# Patient Record
Sex: Female | Born: 1955 | Race: Asian | Hispanic: No | Marital: Married | Smoking: Never smoker
Health system: Southern US, Community
[De-identification: ages and names within clinical notes are randomized; demographics above are authoritative.]

## PROBLEM LIST (undated history)

## (undated) DIAGNOSIS — M1990 Unspecified osteoarthritis, unspecified site: Secondary | ICD-10-CM

## (undated) DIAGNOSIS — H409 Unspecified glaucoma: Secondary | ICD-10-CM

## (undated) DIAGNOSIS — T7840XA Allergy, unspecified, initial encounter: Secondary | ICD-10-CM

## (undated) DIAGNOSIS — K648 Other hemorrhoids: Secondary | ICD-10-CM

## (undated) DIAGNOSIS — E781 Pure hyperglyceridemia: Secondary | ICD-10-CM

## (undated) DIAGNOSIS — F410 Panic disorder [episodic paroxysmal anxiety] without agoraphobia: Secondary | ICD-10-CM

## (undated) DIAGNOSIS — E119 Type 2 diabetes mellitus without complications: Secondary | ICD-10-CM

## (undated) DIAGNOSIS — I10 Essential (primary) hypertension: Secondary | ICD-10-CM

## (undated) DIAGNOSIS — E7800 Pure hypercholesterolemia, unspecified: Secondary | ICD-10-CM

## (undated) DIAGNOSIS — K219 Gastro-esophageal reflux disease without esophagitis: Secondary | ICD-10-CM

## (undated) DIAGNOSIS — H269 Unspecified cataract: Secondary | ICD-10-CM

## (undated) HISTORY — DX: Essential (primary) hypertension: I10

## (undated) HISTORY — DX: Allergy, unspecified, initial encounter: T78.40XA

## (undated) HISTORY — DX: Unspecified cataract: H26.9

## (undated) HISTORY — DX: Panic disorder (episodic paroxysmal anxiety): F41.0

## (undated) HISTORY — DX: Unspecified glaucoma: H40.9

## (undated) HISTORY — PX: EYE SURGERY: SHX253

## (undated) HISTORY — DX: Gastro-esophageal reflux disease without esophagitis: K21.9

## (undated) HISTORY — DX: Unspecified osteoarthritis, unspecified site: M19.90

## (undated) HISTORY — DX: Pure hyperglyceridemia: E78.1

## (undated) HISTORY — DX: Type 2 diabetes mellitus without complications: E11.9

## (undated) HISTORY — DX: Other hemorrhoids: K64.8

## (undated) HISTORY — PX: COLONOSCOPY: SHX174

---

## 1997-01-05 HISTORY — PX: OOPHORECTOMY: SHX86

## 1998-06-17 ENCOUNTER — Encounter: Payer: Self-pay | Admitting: Family Medicine

## 2004-07-02 ENCOUNTER — Other Ambulatory Visit: Admission: RE | Admit: 2004-07-02 | Discharge: 2004-07-02 | Payer: Self-pay | Admitting: Obstetrics and Gynecology

## 2004-08-25 ENCOUNTER — Encounter: Admission: RE | Admit: 2004-08-25 | Discharge: 2004-11-23 | Payer: Self-pay | Admitting: Family Medicine

## 2006-04-02 ENCOUNTER — Encounter: Payer: Self-pay | Admitting: Family Medicine

## 2006-04-09 ENCOUNTER — Ambulatory Visit: Payer: Self-pay | Admitting: Internal Medicine

## 2006-04-15 ENCOUNTER — Encounter: Admission: RE | Admit: 2006-04-15 | Discharge: 2006-07-14 | Payer: Self-pay | Admitting: Family Medicine

## 2006-05-03 ENCOUNTER — Encounter: Payer: Self-pay | Admitting: Family Medicine

## 2006-05-03 ENCOUNTER — Ambulatory Visit: Payer: Self-pay | Admitting: Internal Medicine

## 2006-05-03 HISTORY — PX: COLONOSCOPY: SHX5424

## 2006-05-18 ENCOUNTER — Encounter: Admission: RE | Admit: 2006-05-18 | Discharge: 2006-05-18 | Payer: Self-pay | Admitting: Family Medicine

## 2007-08-08 ENCOUNTER — Encounter: Payer: Self-pay | Admitting: Family Medicine

## 2007-08-08 ENCOUNTER — Encounter: Admission: RE | Admit: 2007-08-08 | Discharge: 2007-08-08 | Payer: Self-pay | Admitting: Family Medicine

## 2008-04-11 ENCOUNTER — Encounter: Payer: Self-pay | Admitting: Family Medicine

## 2008-04-12 ENCOUNTER — Encounter: Payer: Self-pay | Admitting: Family Medicine

## 2008-04-12 ENCOUNTER — Encounter: Admission: RE | Admit: 2008-04-12 | Discharge: 2008-04-12 | Payer: Self-pay | Admitting: Family Medicine

## 2009-09-02 ENCOUNTER — Ambulatory Visit: Payer: Self-pay | Admitting: Family Medicine

## 2009-09-02 ENCOUNTER — Other Ambulatory Visit: Admission: RE | Admit: 2009-09-02 | Discharge: 2009-09-02 | Payer: Self-pay | Admitting: Family Medicine

## 2009-09-02 DIAGNOSIS — E785 Hyperlipidemia, unspecified: Secondary | ICD-10-CM | POA: Insufficient documentation

## 2009-09-04 LAB — CONVERTED CEMR LAB
BUN: 16 mg/dL (ref 6–23)
CO2: 30 meq/L (ref 19–32)
Calcium: 9.4 mg/dL (ref 8.4–10.5)
Cholesterol: 235 mg/dL — ABNORMAL HIGH (ref 0–200)
Creatinine, Ser: 0.6 mg/dL (ref 0.4–1.2)
Direct LDL: 146.3 mg/dL
Glucose, Bld: 105 mg/dL — ABNORMAL HIGH (ref 70–99)
Total CHOL/HDL Ratio: 4
Triglycerides: 165 mg/dL — ABNORMAL HIGH (ref 0.0–149.0)

## 2009-09-10 ENCOUNTER — Encounter: Payer: Self-pay | Admitting: Family Medicine

## 2009-10-01 ENCOUNTER — Encounter: Admission: RE | Admit: 2009-10-01 | Discharge: 2009-10-01 | Payer: Self-pay | Admitting: Family Medicine

## 2010-01-01 ENCOUNTER — Ambulatory Visit: Payer: Self-pay | Admitting: Family Medicine

## 2010-01-26 ENCOUNTER — Encounter: Payer: Self-pay | Admitting: Family Medicine

## 2010-01-30 ENCOUNTER — Ambulatory Visit
Admission: RE | Admit: 2010-01-30 | Discharge: 2010-01-30 | Payer: Self-pay | Source: Home / Self Care | Attending: Family Medicine | Admitting: Family Medicine

## 2010-01-30 ENCOUNTER — Encounter: Payer: Self-pay | Admitting: Family Medicine

## 2010-02-04 NOTE — Letter (Signed)
Summary: Results Follow up Letter  [facility]  [address] [place], [place] [address]   Phone: [phone]  Fax: [phone]    09/10/2009 MRN: [id number]  Victoria Neal [address] [place], [place]  [address]  Dear Ms. Bess Kinds,  The following are the results of your recent test(s):  Test         Result    Pap Smear:        Normal __X___  Not Normal _____ Comments: ______________________________________________________ Cholesterol: LDL(Bad cholesterol):         Your goal is less than:         HDL (Good cholesterol):       Your goal is more than: Comments:  ______________________________________________________ Mammogram:        Normal _____  Not Normal _____ Comments:  ___________________________________________________________________ Hemoccult:        Normal _____  Not normal _______ Comments:    _____________________________________________________________________ Other Tests:    We routinely do not discuss normal results over the telephone.  If you desire a copy of the results, or you have any questions about this information we can discuss them at your next office visit.   Sincerely,      Ruthe Mannan, MD

## 2010-02-04 NOTE — Assessment & Plan Note (Signed)
Summary: NEW PT TO EST/CPX/CLE   Vital Signs:  Patient profile:   55 year old female Height:      63.5 inches Weight:      128 pounds BMI:     22.40 Temp:     98.6 degrees F oral Pulse rate:   60 / minute Pulse rhythm:   regular BP sitting:   128 / 88  (left arm) Cuff size:   regular  Vitals Entered By: Sydell Axon LPN (11-Sep-2009 9:57 AM) CC: New patient to get established   History of Present Illness: 55 yo here to establish care and for CPX.  Borderline DM- was told years ago that she has borderline DM.  Exercises and tries to eat right.  Never been on medication for diabetes.  Denies frequent thirst or urination.  No frequent yeast infections.    Fatigue- does often feel fatigues although she thinks it is due to her busy lifestyle.  Hair has been a little dry lately.  Son has h/o hypothyroidism.  Well woman- no h/o abnormal pap smears.  Preventive Screening-Counseling & Management  Alcohol-Tobacco     Smoking Status: never  Caffeine-Diet-Exercise     Does Patient Exercise: yes      Drug Use:  no.    Current Medications (verified): 1)  Multivitamins  Tabs (Multiple Vitamin) .... Take One By Mouth Daily 2)  Calcium 500 Mg Tabs (Calcium) .... Take One By Mouth Two Times A Day  Allergies (verified): 1)  ! Aspirin (Aspirin)  Past History:  Family History: Last updated: 2009/09/11 Mom died of colon CA at 104 yo Sister died of lung CA at 3. 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 Eyes:  Denies blurring. ENT:  Denies difficulty swallowing. CV:  Denies chest pain or discomfort. Resp:  Denies shortness of breath. GI:  Denies abdominal pain, bloody stools, and change in bowel habits. GU:  Denies abnormal vaginal bleeding, decreased libido, discharge, and dysuria. MS:  Denies joint pain, joint redness, and joint swelling. Derm:  Denies rash. Neuro:  Denies visual disturbances and weakness. Psych:  Denies anxiety and depression. Endo:  Complains of cold intolerance; denies excessive thirst and excessive urination. Heme:  Denies abnormal bruising and bleeding.  Physical Exam  General:  Well-developed,well-nourished,in no acute distress; alert,appropriate and cooperative throughout examination Head:  normocephalic, atraumatic, and no abnormalities observed.   Eyes:  vision grossly intact, pupils equal, pupils round, and pupils reactive to light.   Ears:  R ear normal and L ear normal.   Nose:  no external deformity.   Mouth:  good dentition.   Breasts:  No mass, nodules, thickening, tenderness, bulging, retraction, inflamation, nipple discharge or skin changes noted.   Lungs:  Normal respiratory effort, chest expands symmetrically. Lungs are clear to auscultation, no crackles or wheezes. Heart:  Normal rate and regular rhythm. S1 and S2 normal  without gallop, murmur, click, rub or other extra sounds. Abdomen:  Bowel sounds positive,abdomen soft and non-tender without masses, organomegaly or hernias noted. Rectal:  no external abnormalities and no hemorrhoids.   Genitalia:  Pelvic Exam:        External: normal female genitalia without lesions or masses        Vagina: normal without lesions or masses        Cervix: normal without lesions or masses        Adnexa: normal  bimanual exam without masses or fullness        Uterus: normal by palpation        Pap smear: performed Msk:  No deformity or scoliosis noted of thoracic or lumbar spine.   Extremities:  No clubbing, cyanosis, edema, or deformity noted with normal full range of motion of all joints.   Neurologic:  alert & oriented X3 and gait normal.   Skin:  Intact without suspicious lesions or rashes Psych:  Cognition and judgment appear intact. Alert and cooperative with normal attention span and concentration. No apparent delusions, illusions, hallucinations   Impression & Recommendations:  Problem # 1:  Preventive Health Care (ICD-V70.0) Reviewed preventive care protocols, scheduled due services, and updated immunizations Discussed nutrition, exercise, diet, and healthy lifestyle.  Tdap, pap smear today. Set up mammogram today.  Problem # 2:  ? of UNSPECIFIED HYPOTHYROIDISM (ICD-244.9) Assessment: New check TSH today. Orders: TLB-TSH (Thyroid Stimulating Hormone) (84443-TSH)  Problem # 3:  DIABETES MELLITUS, TYPE II (ICD-250.00) a1c today. Orders: TLB-A1C / Hgb A1C (Glycohemoglobin) (83036-A1C) TLB-Lipid Panel (80061-LIPID)  Complete Medication List: 1)  Multivitamins Tabs (Multiple vitamin) .... Take one by mouth daily 2)  Calcium 500 Mg Tabs (Calcium) .... Take one by mouth two times a day  Other Orders: Radiology Referral (Radiology) Venipuncture [phone]) TLB-BMP (Basic Metabolic Panel-BMET) (80048-METABOL) Tdap => 45yrs IM (60454) Admin 1st Vaccine ([address]) Admin 1st Vaccine [facility]) [phone])  Patient Instructions: 1)  Please stop by to see Shirlee Limerick on your way out.  Current Allergies (reviewed today): ! ASPIRIN (ASPIRIN)   Prevention & Chronic Care Immunizations   Influenza vaccine: Not documented    Tetanus booster: 09/02/2009: Tdap    Pneumococcal vaccine: Not documented  Colorectal Screening   Hemoccult: Not documented   Hemoccult due: Not Indicated    Colonoscopy:  historical  (08/17/2007)   Colonoscopy due: 08/16/2017  Other Screening   Pap smear: Not documented   Pap smear action/deferral: Ordered  (09/02/2009)    Mammogram: Not documented   Mammogram action/deferral: Ordered  (09/02/2009)   Smoking status: never  (09/02/2009)  Diabetes Mellitus   HgbA1C: Not documented   HgbA1C action/deferral: Ordered  (09/02/2009)    Eye exam: Not documented    Foot exam: Not documented   High risk foot: Not documented   Foot care education: Not documented    Urine microalbumin/creatinine ratio: Not documented  Lipids   Total Cholesterol: Not documented   Lipid panel action/deferral: Lipid Panel ordered   LDL: Not documented   LDL Direct: Not documented   HDL: Not documented   Triglycerides: Not documented  Self-Management Support :    Diabetes self-management support: Not documented   Nursing Instructions: Give tetanus booster today Pap smear today Schedule screening mammogram (see order) HgbA1C today (see order)    Tetanus/Td Vaccine    Vaccine Type: Tdap    Site: left deltoid    Mfr: GlaxoSmithKline    Dose: 0.5 ml    Given by: Sydell Axon  LPN    Exp. Date: 11/02/2011    Lot #: [id number]    VIS given: 11/23/06 version given September 02, 2009.   Past Medical History:    Diabetes mellitus, type II borderline  Past Surgical History:    Oophorectomy   Flex Sig Next Due:  Not Indicated Colonoscopy Result Date:  08/17/2007 Colonoscopy Result:  historical Colonoscopy Next Due:  10 yr Hemoccult Next Due:  Not Indicated

## 2010-02-04 NOTE — Procedures (Signed)
Summary: Colonoscopy/[facility]  Colonoscopy/[facility]   Imported By: Sherian Rein 09/23/2009 14:46:30  _____________________________________________________________________  External Attachment:    Type:   Image     Comment:   External Document

## 2010-02-06 NOTE — Assessment & Plan Note (Signed)
Summary: coughing/alc   Vital Signs:  Patient profile:   55 year old female Height:      63.5 inches Weight:      130.25 pounds BMI:     22.79 Temp:     98.9 degrees F oral Pulse rate:   73 / minute Pulse rhythm:   regular BP sitting:   130 / 90  (left arm) Cuff size:   regular  Vitals Entered By: Linde Gillis CMA Duncan Dull) (January 30, 2010 3:25 PM) CC: cough   History of Present Illness: 55 yo with 3 days of runny nose, dry cough, laryngitis. No fevers or chills. Taking OTC decongestant and Nyquil both of which are helping.  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  No CP or SOB.  Current Medications (verified): 1)  Multivitamins  Tabs (Multiple Vitamin) .... Take One By Mouth Daily 2)  Calcium 500 Mg Tabs (Calcium) .... Take One By Mouth Two Times A Day 3)  Metformin Hcl 500 Mg Tabs (Metformin Hcl) .... Take One Tablet By Mouth Daily  Allergies: 1)  ! Aspirin (Aspirin)  Past History:  Past Medical History: Last updated: 01-Oct-2009 Diabetes mellitus, type II borderline  Past Surgical History: Last updated: Oct 01, 2009 Oophorectomy  Family History: Last updated: 2009-10-01 Mom died of colon CA at 40 yo Sister died of lung CA at 39. 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 ENT:  Complains of hoarseness, nasal congestion, sinus pressure, and sore throat. Resp:  Complains of cough; denies shortness of breath, sputum productive, and wheezing.  Physical Exam  General:  Well-developed,well-nourished,in no acute distress; alert,appropriate and cooperative throughout examination, sounds hoarse Ears:  R ear normal and L ear normal.   Nose:  mild eythema, +PND Mouth:   pharyngeal erythema.   Lungs:  Normal respiratory effort, chest expands symmetrically. Lungs are clear to auscultation, no crackles or wheezes. Heart:  Normal rate and regular rhythm. S1 and S2 normal without gallop, murmur, click, rub or other extra sounds. Extremities:  No clubbing, cyanosis, edema,   Psych:  Cognition and judgment appear intact. Alert and cooperative with normal attention span and concentration. No apparent delusions, illusions, hallucinations   Impression & Recommendations:  Problem # 1:  URI (ICD-465.9) Assessment New Likely viral. Advised continued supportive care. See pt instructions for details.  Complete Medication List: 1)  Multivitamins Tabs (Multiple vitamin) .... Take one by mouth daily 2)  Calcium 500 Mg Tabs (Calcium) .... Take one by mouth two times a day 3)  Metformin Hcl 500 Mg Tabs (Metformin hcl) .... Take one tablet by mouth daily  Patient Instructions: 1)  Get plenty of rest, drink lots of clear liquids, and use Tylenol or Ibuprofen for fever and comfort. Return in 7-10 days if you're not better: sooner if you'er feeling worse.    Orders Added: 1)  Est. Patient Level III [08657]    Current Allergies (reviewed today): ! ASPIRIN (ASPIRIN)

## 2010-02-06 NOTE — Assessment & Plan Note (Signed)
Summary: problems with fingers/rbh   Vital Signs:  Patient profile:   55 year old female Height:      63.5 inches Weight:      129.75 pounds BMI:     22.71 Temp:     98.7 degrees F oral Pulse rate:   68 / minute Pulse rhythm:   regular BP sitting:   120 / 80  (left arm) Cuff size:   regular  Vitals Entered By: Linde Gillis CMA Duncan Dull) (January 01, 2010 10:14 AM) CC: fingers cramping   History of Present Illness: 55 yo here with finger cramping.  H/o DM on Moetromin since August.  No known injury but has noticed in past couple of months, inner palm of her left hand feels tight and she cannot close her middle finger all the way.  Afraid she will drop something if she grips something valuable in that hand.  No radiculopathy.  Never had anything like this before.  In August 2011, a1c was 6.4. CBGs have been stable.  Current Medications (verified): 1)  Multivitamins  Tabs (Multiple Vitamin) .... Take One By Mouth Daily 2)  Calcium 500 Mg Tabs (Calcium) .... Take One By Mouth Two Times A Day 3)  Metformin Hcl 500 Mg Tabs (Metformin Hcl) .... Take One Tablet By Mouth Daily  Allergies: 1)  ! Aspirin (Aspirin)  Past History:  Past Medical History: Last updated: 09-18-2009 Diabetes mellitus, type II borderline  Past Surgical History: Last updated: 18-Sep-2009 Oophorectomy  Family History: Last updated: Sep 18, 2009 Mom died of colon CA at 21 yo Sister died of lung CA at 81. 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 MS:  Complains of loss of strength and muscle weakness.  Physical Exam  General:  Well-developed,well-nourished,in no acute distress; alert,appropriate and cooperative throughout examination Msk:  left  palm- palpable cords in palm, difficult flexing middle finger.   Strenght and sensation otherwise in tact. Pulses:  R and L carotid,radial,femoral,dorsalis pedis and posterior tibial pulses are full and equal bilaterally Extremities:  No clubbing, cyanosis, edema,   Psych:  Cognition and judgment appear intact. Alert and cooperative with normal attention span and concentration. No apparent delusions, illusions, hallucinations   Impression & Recommendations:  Problem # 1:  ? 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  Given handout for conservative stretches.  Pt to follow up in 3 months, sooner if symptoms progress.  Complete Medication List: 1)  Multivitamins Tabs (Multiple vitamin) .... Take one by mouth daily 2)  Calcium 500 Mg Tabs (Calcium) .... Take one by mouth two times a day 3)  Metformin Hcl 500 Mg Tabs (Metformin hcl) .... Take one tablet by mouth daily   Orders Added: 1)  Est. Patient Level IV [[address]]    Current Allergies (reviewed today): ! ASPIRIN (ASPIRIN)

## 2010-02-06 NOTE — Letter (Signed)
Summary: Out of Work  [facility]  [address] [place], [place] [address]   Phone: [phone]  Fax: [phone]    January 30, 2010   Employee:  Flossie Gose    To Whom It May Concern:   For Medical reasons, please excuse the above named employee from work for the following dates:  Start:   01/29/2010  End:   01/31/2010  If you need additional information, please feel free to contact our office.         Sincerely,    Ruthe Mannan MD

## 2010-11-03 ENCOUNTER — Other Ambulatory Visit: Payer: Self-pay | Admitting: Family Medicine

## 2010-11-03 DIAGNOSIS — Z1231 Encounter for screening mammogram for malignant neoplasm of breast: Secondary | ICD-10-CM

## 2010-11-20 ENCOUNTER — Ambulatory Visit
Admission: RE | Admit: 2010-11-20 | Discharge: 2010-11-20 | Disposition: A | Discharge status: Home / Self Care | Payer: BC Managed Care – PPO | Source: Ambulatory Visit | Attending: Family Medicine | Admitting: Family Medicine

## 2010-11-20 DIAGNOSIS — Z1231 Encounter for screening mammogram for malignant neoplasm of breast: Secondary | ICD-10-CM

## 2010-12-10 ENCOUNTER — Ambulatory Visit: Payer: Self-pay | Admitting: Internal Medicine

## 2010-12-26 ENCOUNTER — Other Ambulatory Visit: Payer: Self-pay | Admitting: Family Medicine

## 2011-01-07 ENCOUNTER — Other Ambulatory Visit: Payer: BC Managed Care – PPO

## 2011-01-23 LAB — HM MAMMOGRAPHY: HM Mammogram: NORMAL

## 2011-03-18 ENCOUNTER — Encounter: Payer: Self-pay | Admitting: Internal Medicine

## 2011-04-02 ENCOUNTER — Ambulatory Visit
Admission: RE | Admit: 2011-04-02 | Discharge: 2011-04-02 | Disposition: A | Discharge status: Home / Self Care | Payer: BC Managed Care – PPO | Source: Ambulatory Visit | Attending: Family Medicine | Admitting: Family Medicine

## 2011-09-06 LAB — HM DIABETES EYE EXAM

## 2011-11-16 ENCOUNTER — Other Ambulatory Visit: Payer: Self-pay | Admitting: Family Medicine

## 2011-11-16 DIAGNOSIS — Z1231 Encounter for screening mammogram for malignant neoplasm of breast: Secondary | ICD-10-CM

## 2011-12-02 LAB — HM PAP SMEAR: HM Pap smear: NORMAL

## 2011-12-21 ENCOUNTER — Ambulatory Visit: Payer: BC Managed Care – PPO

## 2011-12-23 ENCOUNTER — Ambulatory Visit
Admission: RE | Admit: 2011-12-23 | Discharge: 2011-12-23 | Disposition: A | Discharge status: Home / Self Care | Payer: BC Managed Care – PPO | Source: Ambulatory Visit | Attending: Family Medicine | Admitting: Family Medicine

## 2011-12-23 DIAGNOSIS — Z1231 Encounter for screening mammogram for malignant neoplasm of breast: Secondary | ICD-10-CM

## 2012-01-11 ENCOUNTER — Other Ambulatory Visit: Payer: Self-pay | Admitting: Ophthalmology

## 2012-01-11 DIAGNOSIS — H0589 Other disorders of orbit: Secondary | ICD-10-CM

## 2012-01-19 ENCOUNTER — Other Ambulatory Visit: Payer: BC Managed Care – PPO

## 2012-01-21 ENCOUNTER — Ambulatory Visit
Admission: RE | Admit: 2012-01-21 | Discharge: 2012-01-21 | Disposition: A | Discharge status: Home / Self Care | Payer: BC Managed Care – PPO | Source: Ambulatory Visit | Attending: Ophthalmology | Admitting: Ophthalmology

## 2012-01-21 DIAGNOSIS — H0589 Other disorders of orbit: Secondary | ICD-10-CM

## 2012-01-21 MED ORDER — IOHEXOL 300 MG/ML  SOLN
75.0000 mL | Freq: Once | INTRAMUSCULAR | Status: AC | PRN
  Administered 2012-01-21: 75 mL via INTRAVENOUS

## 2012-06-09 ENCOUNTER — Emergency Department (HOSPITAL_COMMUNITY): Payer: BC Managed Care – PPO

## 2012-06-09 ENCOUNTER — Encounter (HOSPITAL_COMMUNITY): Payer: Self-pay | Admitting: Emergency Medicine

## 2012-06-09 ENCOUNTER — Emergency Department (HOSPITAL_COMMUNITY)
Admission: EM | Admit: 2012-06-09 | Discharge: 2012-06-10 | Disposition: A | Discharge status: Home / Self Care | Payer: BC Managed Care – PPO | Attending: Emergency Medicine | Admitting: Emergency Medicine

## 2012-06-09 DIAGNOSIS — R079 Chest pain, unspecified: Secondary | ICD-10-CM

## 2012-06-09 DIAGNOSIS — Z87898 Personal history of other specified conditions: Secondary | ICD-10-CM | POA: Insufficient documentation

## 2012-06-09 DIAGNOSIS — Z8719 Personal history of other diseases of the digestive system: Secondary | ICD-10-CM | POA: Insufficient documentation

## 2012-06-09 DIAGNOSIS — E119 Type 2 diabetes mellitus without complications: Secondary | ICD-10-CM | POA: Insufficient documentation

## 2012-06-09 DIAGNOSIS — E785 Hyperlipidemia, unspecified: Secondary | ICD-10-CM | POA: Insufficient documentation

## 2012-06-09 DIAGNOSIS — R51 Headache: Secondary | ICD-10-CM | POA: Insufficient documentation

## 2012-06-09 DIAGNOSIS — R0602 Shortness of breath: Secondary | ICD-10-CM | POA: Insufficient documentation

## 2012-06-09 DIAGNOSIS — I10 Essential (primary) hypertension: Secondary | ICD-10-CM | POA: Insufficient documentation

## 2012-06-09 DIAGNOSIS — R0789 Other chest pain: Secondary | ICD-10-CM | POA: Insufficient documentation

## 2012-06-09 DIAGNOSIS — R109 Unspecified abdominal pain: Secondary | ICD-10-CM | POA: Insufficient documentation

## 2012-06-09 DIAGNOSIS — Z79899 Other long term (current) drug therapy: Secondary | ICD-10-CM | POA: Insufficient documentation

## 2012-06-09 DIAGNOSIS — M810 Age-related osteoporosis without current pathological fracture: Secondary | ICD-10-CM | POA: Insufficient documentation

## 2012-06-09 LAB — CBC
HCT: 43.3 % (ref 36.0–46.0)
Hemoglobin: 14.9 g/dL (ref 12.0–15.0)
MCH: 29.6 pg (ref 26.0–34.0)
MCHC: 34.4 g/dL (ref 30.0–36.0)
MCV: 86.1 fL (ref 78.0–100.0)

## 2012-06-09 LAB — TROPONIN I: Troponin I: <0.3 ng/mL (ref <0.30)

## 2012-06-09 LAB — BASIC METABOLIC PANEL
BUN: 15 mg/dL (ref 6–23)
Calcium: 10.3 mg/dL (ref 8.4–10.5)
Creatinine, Ser: 0.64 mg/dL (ref 0.50–1.10)
GFR calc non Af Amer: >90 mL/min (ref >90)
Glucose, Bld: 148 mg/dL — ABNORMAL HIGH (ref 70–99)

## 2012-06-09 MED ORDER — ALUM & MAG HYDROXIDE-SIMETH 200-200-20 MG/5ML PO SUSP
30.0000 mL | Freq: Once | ORAL | Status: AC
  Administered 2012-06-09: 30 mL via ORAL
  Filled 2012-06-09: qty 30

## 2012-06-09 NOTE — ED Notes (Signed)
Pt c/o mid sternal CP today with abd pain and HA

## 2012-06-09 NOTE — ED Notes (Signed)
Wait time advised 

## 2012-06-10 LAB — HM DIABETES EYE EXAM

## 2012-06-10 NOTE — ED Provider Notes (Signed)
History     CSN: [id number]  Arrival date & time 06/09/12  1755   First MD Initiated Contact with Patient 06/09/12 2118      Chief Complaint  Patient presents with  . Chest Pain  . Headache  . Abdominal Pain    (Consider location/radiation/quality/duration/timing/severity/associated sxs/prior treatment) Patient is a 57 y.o. female presenting with chest pain, headaches, and abdominal pain.  Chest Pain Pain location:  Substernal area Pain quality: aching   Pain radiates to:  Does not radiate Pain radiates to the back: no   Pain severity:  Mild Onset quality:  Sudden Duration:  1 day Timing:  Intermittent Progression:  Unchanged Chronicity:  New Context: eating (occurred after eating)   Relieved by:  Nothing Worsened by:  Nothing tried Ineffective treatments:  None tried Associated symptoms: abdominal pain, headache and shortness of breath   Associated symptoms: no cough, no dizziness, no fever, no nausea, no numbness, no palpitations, not vomiting and no weakness   Headache Associated symptoms: abdominal pain   Associated symptoms: no congestion, no cough, no diarrhea, no dizziness, no fever, no nausea, no numbness, no sore throat and no vomiting   Abdominal Pain Associated symptoms include abdominal pain, chest pain and headaches. Pertinent negatives include no chills, congestion, coughing, fever, nausea, numbness, sore throat, vomiting or weakness.    Past Medical History  Diagnosis Date  . DM (diabetes mellitus)   . Pure hyperglyceridemia   . Glaucoma   . HTN (hypertension)   . Allergic rhinitis   . Osteoporosis   . Internal hemorrhoids     Past Surgical History  Procedure Laterality Date  . Colonoscopy  05/03/2006    internal hemorrhoids    Family History  Problem Relation Age of Onset  . Coronary artery disease Maternal Uncle   . Diabetes      paternal family  . Hypertension      maternal family  . Lung cancer Sister   . Breast cancer Maternal Aunt    . Colon cancer Mother     deceased age 47    History  Substance Use Topics  . Smoking status: Not on file  . Smokeless tobacco: Not on file  . Alcohol Use: Not on file    OB History   Grav Para Term Preterm Abortions TAB SAB Ect Mult Living                  Review of Systems  Constitutional: Negative for fever and chills.  HENT: Negative for congestion, sore throat and rhinorrhea.   Respiratory: Positive for shortness of breath. Negative for cough and chest tightness.   Cardiovascular: Positive for chest pain. Negative for palpitations.  Gastrointestinal: Positive for abdominal pain. Negative for nausea, vomiting, diarrhea, constipation and rectal pain.  Genitourinary: Negative for dysuria.  Neurological: Positive for headaches. Negative for dizziness, weakness and numbness.  All other systems reviewed and are negative.    Allergies  Aspirin; Lisinopril; and Statins  Home Medications   Current Outpatient Rx  Name  Route  Sig  Dispense  Refill  . amLODipine (NORVASC) 5 MG tablet   Oral   Take 10 mg by mouth daily.          . metFORMIN (GLUCOPHAGE-XR) 500 MG 24 hr tablet   Oral   Take 500 mg by mouth daily with breakfast.          . pravastatin (PRAVACHOL) 40 MG tablet   Oral   Take 40 mg by mouth  daily.           BP 159/78  Pulse 65  Temp(Src) 98 F (36.7 C) (Oral)  Resp 18  SpO2 99%  Physical Exam  Nursing note and vitals reviewed. Constitutional: She is oriented to person, place, and time. She appears well-developed and well-nourished. No distress.  HENT:  Head: Normocephalic and atraumatic.  Mouth/Throat: Oropharynx is clear and moist.  Eyes: EOM are normal. Pupils are equal, round, and reactive to light.  Neck: Normal range of motion. Neck supple.  Cardiovascular: Normal rate, regular rhythm and normal heart sounds.  Exam reveals no friction rub.   No murmur heard. Pulmonary/Chest: Effort normal and breath sounds normal. No respiratory  distress. She has no wheezes. She has no rales.  Abdominal: Soft. There is no tenderness. There is no rebound and no guarding.  Musculoskeletal: Normal range of motion. She exhibits no edema and no tenderness.  Lymphadenopathy:    She has no cervical adenopathy.  Neurological: She is alert and oriented to person, place, and time.  Skin: Skin is warm and dry. No rash noted.  Psychiatric: She has a normal mood and affect. Her behavior is normal.    ED Course  Procedures (including critical care time)  Labs Reviewed  BASIC METABOLIC PANEL - Abnormal; Notable for the following:    Glucose, Bld 148 (*)    All other components within normal limits  CBC  TROPONIN I  POCT I-STAT TROPONIN I   Dg Chest 2 View  06/09/2012   *RADIOLOGY REPORT*  Clinical Data: Chest pain and heaviness; shortness of breath, headache and abdominal pain.  CHEST - 2 VIEW  Comparison: None.  Findings: The lungs are well-aerated.  Mild left basilar opacity likely reflects atelectasis.  There is no evidence of pleural effusion or pneumothorax.  The heart is normal in size; the mediastinal contour is within normal limits.  No acute osseous abnormalities are seen.  IMPRESSION: Mild left basilar airspace opacity likely reflects atelectasis; lungs otherwise clear.   Original Report Authenticated By: Tonia Ghent, M.D.    Date: 06/10/2012 6:03PM  Rate: 70  Rhythm: normal sinus rhythm  QRS Axis: normal  Intervals: normal  ST/T Wave abnormalities: normal  Conduction Disutrbances:left anterior fascicular block  Narrative Interpretation: incomplete RBBB, LVH  Old EKG Reviewed: unchanged   Date: 06/10/2012 12:03AM  Rate: 61  Rhythm: normal sinus rhythm  QRS Axis: normal  Intervals: normal  ST/T Wave abnormalities: normal  Conduction Disutrbances:left posterior fascicular block  Narrative Interpretation: incomplete RBBB  Old EKG Reviewed: unchanged    1. Chest pain       MDM  12:07 AM 56yo female with h/o DM,  HTN, HLD here with chest pain that started today after eating. Reports substernal with aching sensation and SOB. No n/v/d, cough or fever. Reports h/o similar but not as bad. Episodes are intermittent lasting about one minute then resolving. Overall low suspicion for ACS, doubt PE. Initial EKG unremarkable. Delta tn neg. Repeat EKG with delta tn unchanged. Discussed with pt. Will call to f/u with cardiology. Pt given return precautions and dc'd home in stable condition.         Caren Hazy, MD 06/10/12 [phone]

## 2012-06-11 NOTE — ED Provider Notes (Signed)
I have personally seen and examined the patient.  I have discussed the plan of care with the resident.  I have reviewed the documentation on PMH/FH/Soc. History.  I have reviewed the documentation of the resident and agree.  I have reviewed and agree with the ECG interpretation(s) documented by the resident.  Pt with some atypical features of CP on my eval.  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  She denied exertional CP on my evaluation  I Feel she is safe for d/c home  Joya Gaskins, MD 06/11/12 [phone]

## 2012-09-20 ENCOUNTER — Telehealth: Payer: Self-pay | Admitting: Family Medicine

## 2012-09-20 NOTE — Telephone Encounter (Signed)
Ok with me. Needs new patient visit. Let her know will be leaving on maternity in next 2 weeks. Should return in mid November.

## 2012-09-20 NOTE — Telephone Encounter (Signed)
Patient was patient of Dr. Elmer Sow - has not been seen in over 2 years. She now lives on this side of [place] and would like to transfer to [employer] (female provider preference) d/t geographical location. Is patient ok to transfer from Dr. Dayton Martes to Dr. Selena Batten?

## 2012-09-20 NOTE — Telephone Encounter (Signed)
Called pt and LMTCB.

## 2012-09-20 NOTE — Telephone Encounter (Signed)
Pt sched for 09/27/12. New pt paperwork mailed.

## 2012-09-20 NOTE — Telephone Encounter (Signed)
Ok with me if ok with Dr. Selena Batten. She is a nice lady.

## 2012-09-27 ENCOUNTER — Encounter: Payer: Self-pay | Admitting: Family Medicine

## 2012-09-27 ENCOUNTER — Ambulatory Visit (INDEPENDENT_AMBULATORY_CARE_PROVIDER_SITE_OTHER): Payer: BC Managed Care – PPO | Admitting: Family Medicine

## 2012-09-27 VITALS — BP 120/80 | Temp 98.1°F | Ht 63.25 in | Wt 135.0 lb

## 2012-09-27 DIAGNOSIS — Z23 Encounter for immunization: Secondary | ICD-10-CM

## 2012-09-27 DIAGNOSIS — E119 Type 2 diabetes mellitus without complications: Secondary | ICD-10-CM

## 2012-09-27 DIAGNOSIS — I10 Essential (primary) hypertension: Secondary | ICD-10-CM

## 2012-09-27 DIAGNOSIS — M810 Age-related osteoporosis without current pathological fracture: Secondary | ICD-10-CM

## 2012-09-27 DIAGNOSIS — E785 Hyperlipidemia, unspecified: Secondary | ICD-10-CM

## 2012-09-27 LAB — BASIC METABOLIC PANEL
CO2: 30 mEq/L (ref 19–32)
Chloride: 105 mEq/L (ref 96–112)
Creatinine, Ser: 0.7 mg/dL (ref 0.4–1.2)
Glucose, Bld: 83 mg/dL (ref 70–99)

## 2012-09-27 LAB — LIPID PANEL
Cholesterol: 252 mg/dL — ABNORMAL HIGH (ref 0–200)
Triglycerides: 300 mg/dL — ABNORMAL HIGH (ref 0.0–149.0)

## 2012-09-27 LAB — MICROALBUMIN / CREATININE URINE RATIO: Creatinine,U: 70.9 mg/dL

## 2012-09-27 LAB — HEMOGLOBIN A1C: Hgb A1c MFr Bld: 7 % — ABNORMAL HIGH (ref 4.6–6.5)

## 2012-09-27 MED ORDER — PRAVASTATIN SODIUM 20 MG PO TABS: 20.0000 mg | ORAL_TABLET | Freq: Every day | ORAL | Status: DC

## 2012-09-27 NOTE — Progress Notes (Signed)
Chief Complaint  Patient presents with  . Establish Care    HPI:  Victoria Neal is here to establish care.  More convenient location. Last PCP and physical: last December 2013 had pap, mammo all normal, taking vit d and calcium; last labs 3 months ago  Has the following chronic problems and concerns today:  Patient Active Problem List   Diagnosis Date Noted  . Hyperlipidemia LDL goal < 100 09/27/2012  . Essential hypertension, benign 09/27/2012  . Osteoporosis, unspecified - refuses medications for this, doing vit D, calcium and exercise 09/27/2012  . DIABETES MELLITUS, TYPE II 09/02/2009   Health Maintenance: -reports UTD on pap, mammo, colonoscopy -get flu vaccine at school  ROS: See pertinent positives and negatives per HPI.  Past Medical History  Diagnosis Date  . DM (diabetes mellitus)   . Pure hyperglyceridemia   . HTN (hypertension)   . Allergic rhinitis   . Internal hemorrhoids   . Glaucoma     ? of, but seeing optho and told recently not glaucoma  . Osteoporosis     in 2010, has refused bisphosphnate treatment or treament other then vit d and calcium    Family History  Problem Relation Age of Onset  . Coronary artery disease Maternal Uncle   . Lung cancer Sister   . Cancer Sister     lung   . Breast cancer Maternal Aunt   . Colon cancer Mother     deceased age 36  . Cancer Mother 26  . Hypertension Mother   . Diabetes Father   . Hypertension Father   . Diabetes Paternal Aunt     History   Social History  . Marital Status: Married    Spouse Name: N/A    Number of Children: N/A  . Years of Education: N/A   Social History Main Topics  . Smoking status: Never Smoker   . Smokeless tobacco: None  . Alcohol Use: Yes     Comment: occ - very rare  . Drug Use: No  . Sexual Activity: None   Other Topics Concern  . 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 [employer] Situation: lives with husband and son  - has 4 children, son in [employer] school, son animation, daughter is Radio producer      Spiritual Beliefs: Baha'i faith - all religions are one, prayer based, no health care restrictions      Lifestyle: no CV exercise; diet is good             Current outpatient prescriptions:amLODipine (NORVASC) 5 MG tablet, Take 5 mg by mouth daily. , Disp: , Rfl: ;  BLACK COHOSH EXTRACT PO, Take by mouth daily., Disp: , Rfl: ;  CINNAMON PO, Take by mouth. 2 to 4 capsules a day., Disp: , Rfl: ;  Cyanocobalamin (B-12 DOTS SL), Place under the tongue., Disp: , Rfl: ;  metFORMIN (GLUCOPHAGE-XR) 500 MG 24 hr tablet, Take 500 mg by mouth daily with breakfast. , Disp: , Rfl:  pravastatin (PRAVACHOL) 20 MG tablet, Take 1 tablet (20 mg total) by mouth daily., Disp: 90 tablet, Rfl: 3;  Specialty Vitamins Products (ONE-A-DAY BONE STRENGTH PO), Take by mouth., Disp: , Rfl:   EXAM:  Filed Vitals:   09/27/12 1422  BP: 120/80  Temp: 98.1 F (36.7 C)    Body mass index is 23.71 kg/(m^2).  GENERAL: vitals reviewed and listed above, alert, oriented, appears well hydrated  and in no acute distress  HEENT: atraumatic, conjunttiva clear, no obvious abnormalities on inspection of external nose and ears  NECK: no obvious masses on inspection  LUNGS: clear to auscultation bilaterally, no wheezes, rales or rhonchi, good air movement  CV: HRRR, no peripheral edema  MS: moves all extremities without noticeable abnormality  PSYCH: pleasant and cooperative, no obvious depression or anxiety  ASSESSMENT AND PLAN:  Discussed the following assessment and plan:  Hyperlipidemia LDL goal < 100 - Plan: Lipid Panel, pravastatin (PRAVACHOL) 20 MG tablet  Essential hypertension, benign - Plan: Basic metabolic panel  Osteoporosis, unspecified - refuses medications for this, doing vit D, calcium and exercise  DIABETES MELLITUS, TYPE II - Plan: Hemoglobin A1c, Microalbumin/Creatinine Ratio, Urine   -We reviewed the PMH, PSH, FH, SH,  Meds and Allergies. -We provided refills for any medications we will prescribe as needed. -We addressed current concerns per orders and patient instructions. -We have asked for records for pertinent exams, studies, vaccines and notes from previous providers. -We have advised patient to follow up per instructions below. -foot exam done -NON FASTING LABS -pneumovax -cholesterol medication refilled -follow up 3 months  -Patient advised to return or notify a doctor immediately if symptoms worsen or persist or new concerns arise.  Patient Instructions  -We have ordered labs or studies at this visit. It can take up to 1-2 weeks for results and processing. We will contact you with instructions IF your results are abnormal. Normal results will be released to your [facility]. If you have not heard from [place] or can not find your results in [facility] in 2 weeks please contact our office.  -PLEASE SIGN UP FOR MYCHART TODAY   We recommend the following healthy lifestyle measures: - eat a healthy diet consisting of lots of vegetables, fruits, beans, nuts, seeds, healthy meats such as white chicken and fish and whole grains.  - avoid fried foods, fast food, processed foods, sodas, red meet and other fattening foods.  - get a least 150 minutes of aerobic exercise per week.     Follow up in: 3-4 months      KIM, HANNAH R.

## 2012-09-27 NOTE — Addendum Note (Signed)
Addended by: Azucena Freed on: 09/27/2012 03:21 PM   Modules accepted: Orders

## 2012-09-27 NOTE — Patient Instructions (Addendum)
-  We have ordered labs or studies at this visit. It can take up to 1-2 weeks for results and processing. We will contact you with instructions IF your results are abnormal. Normal results will be released to your MYCHART. If you have not heard from us or can not find your results in MYCHART in 2 weeks please contact our office.  -PLEASE SIGN UP FOR MYCHART TODAY   We recommend the following healthy lifestyle measures: - eat a healthy diet consisting of lots of vegetables, fruits, beans, nuts, seeds, healthy meats such as white chicken and fish and whole grains.  - avoid fried foods, fast food, processed foods, sodas, red meet and other fattening foods.  - get a least 150 minutes of aerobic exercise per week.   Follow up in: 3-4 months  

## 2012-09-28 NOTE — Addendum Note (Signed)
Addended by: Azucena Freed on: 09/28/2012 12:37 PM   Modules accepted: Orders

## 2012-09-28 NOTE — Progress Notes (Signed)
Quick Note:  Left a message for return call. ______ 

## 2012-09-29 MED ORDER — METFORMIN HCL ER 500 MG PO TB24: 500.0000 mg | ORAL_TABLET | Freq: Two times a day (BID) | ORAL | Status: DC

## 2012-09-29 NOTE — Addendum Note (Signed)
Addended by: Azucena Freed on: 09/29/2012 07:49 AM   Modules accepted: Orders

## 2012-09-30 MED ORDER — PRAVASTATIN SODIUM 40 MG PO TABS: 40.0000 mg | ORAL_TABLET | Freq: Every day | ORAL | Status: DC

## 2012-09-30 NOTE — Progress Notes (Signed)
Quick Note:  New rx sent to pharmacy. ______

## 2012-09-30 NOTE — Progress Notes (Signed)
Quick Note:  Called and spoke with pt and pt is aware. ______ 

## 2012-09-30 NOTE — Addendum Note (Signed)
Addended by: Azucena Freed on: 09/30/2012 01:41 PM   Modules accepted: Orders, Medications

## 2012-10-03 ENCOUNTER — Encounter: Payer: Self-pay | Admitting: Family Medicine

## 2012-10-12 ENCOUNTER — Encounter: Payer: Self-pay | Admitting: Family Medicine

## 2012-12-05 ENCOUNTER — Other Ambulatory Visit: Payer: Self-pay

## 2012-12-05 DIAGNOSIS — Z1231 Encounter for screening mammogram for malignant neoplasm of breast: Secondary | ICD-10-CM

## 2012-12-09 ENCOUNTER — Ambulatory Visit (INDEPENDENT_AMBULATORY_CARE_PROVIDER_SITE_OTHER): Payer: BC Managed Care – PPO | Admitting: Family Medicine

## 2012-12-09 VITALS — BP 120/80 | Temp 98.7°F | Wt 127.0 lb

## 2012-12-09 DIAGNOSIS — E785 Hyperlipidemia, unspecified: Secondary | ICD-10-CM

## 2012-12-09 DIAGNOSIS — I10 Essential (primary) hypertension: Secondary | ICD-10-CM

## 2012-12-09 DIAGNOSIS — E119 Type 2 diabetes mellitus without complications: Secondary | ICD-10-CM

## 2012-12-09 DIAGNOSIS — M549 Dorsalgia, unspecified: Secondary | ICD-10-CM

## 2012-12-09 MED ORDER — AMLODIPINE BESYLATE 5 MG PO TABS: 5.0000 mg | ORAL_TABLET | Freq: Every day | ORAL | Status: DC

## 2012-12-09 MED ORDER — GLUCOSE BLOOD VI STRP: ORAL_STRIP | Status: DC

## 2012-12-09 MED ORDER — FREESTYLE LANCETS MISC: Status: DC

## 2012-12-09 NOTE — Patient Instructions (Signed)
-  restart pravastatin  -heat for 15 minutes twice daily  -topical sports creams, ibuprofen or tylenol as directed for pain as needed  -exercises provided  -follow up at scheduled physical or sooner if concerns

## 2012-12-09 NOTE — Addendum Note (Signed)
Addended by: Azucena Freed on: 12/09/2012 03:08 PM   Modules accepted: Orders

## 2012-12-09 NOTE — Progress Notes (Signed)
Chief Complaint  Patient presents with  . 3 month follow up  . Back Pain    HPI:  I have seen this patient once. She is here for follow up.  Back Pain: -started 4 days ago -in low back, mod  -has some pain in L leg for many years -denies: fevers, chills, bowel or bladder continence, weakness or numbness -took aleve and helped a little  DM: -uncontrolled on labs and advised increasing metformin -she reports: she lost her BS monitor -last eye exam -denies: polyuria, polydipsia Lab Results  Component Value Date   HGBA1C 7.0* 09/27/2012   HLD: -advised increasing pravastatin last visit Lab Results  Component Value Date   CHOL 252* 09/27/2012   HDL 60.90 09/27/2012   LDLDIRECT 145.0 09/27/2012   TRIG 300.0* 09/27/2012   CHOLHDL 4 09/27/2012   HTN: -on amlodipine   ROS: See pertinent positives and negatives per HPI.  Past Medical History  Diagnosis Date  . DM (diabetes mellitus)   . Pure hyperglyceridemia   . HTN (hypertension)   . Allergic rhinitis   . Internal hemorrhoids   . Glaucoma     ? of, but seeing optho and told recently not glaucoma  . Osteoporosis     in 2010, has refused bisphosphnate treatment or treament other then vit d and calcium    Past Surgical History  Procedure Laterality Date  . Colonoscopy  05/03/2006    internal hemorrhoids    Family History  Problem Relation Age of Onset  . Coronary artery disease Maternal Uncle   . Lung cancer Sister   . Cancer Sister     lung   . Breast cancer Maternal Aunt   . Colon cancer Mother     deceased age 18  . Cancer Mother 38  . Hypertension Mother   . Diabetes Father   . Hypertension Father   . Diabetes Paternal Aunt     History   Social History  . Marital Status: Married    Spouse Name: N/A    Number of Children: N/A  . Years of Education: N/A   Social History Main Topics  . Smoking status: Never Smoker   . Smokeless tobacco: Not on file  . Alcohol Use: Yes     Comment: occ - very  rare  . Drug Use: No  . Sexual Activity: Not on file   Other Topics Concern  . Not on file   Social History Narrative   Work or School: 2nd grade at [employer] Situation: lives with husband and son - has 4 children, son in [employer] school, son animation, daughter is Radio producer      Spiritual Beliefs: Baha'i faith - all religions are one, prayer based, no health care restrictions      Lifestyle: no CV exercise; diet is good             Current outpatient prescriptions:amLODipine (NORVASC) 5 MG tablet, Take 1 tablet (5 mg total) by mouth daily., Disp: 90 tablet, Rfl: 1;  BLACK COHOSH EXTRACT PO, Take by mouth daily., Disp: , Rfl: ;  CINNAMON PO, Take by mouth. 2 to 4 capsules a day., Disp: , Rfl: ;  Cyanocobalamin (B-12 DOTS SL), Place under the tongue., Disp: , Rfl:  metFORMIN (GLUCOPHAGE-XR) 500 MG 24 hr tablet, Take 1 tablet (500 mg total) by mouth 2 (two) times daily., Disp: 60 tablet, Rfl: 5;  Specialty Vitamins Products (ONE-A-DAY BONE STRENGTH PO), Take by mouth., Disp: ,  Rfl: ;  pravastatin (PRAVACHOL) 40 MG tablet, Take 1 tablet (40 mg total) by mouth daily., Disp: 90 tablet, Rfl: 0  EXAM:  Filed Vitals:   12/09/12 0926  BP: 120/80  Temp: 98.7 F (37.1 C)    Body mass index is 22.31 kg/(m^2).  GENERAL: vitals reviewed and listed above, alert, oriented, appears well hydrated and in no acute distress  HEENT: atraumatic, conjunttiva clear, no obvious abnormalities on inspection of external nose and ears  NECK: no obvious masses on inspection  LUNGS: clear to auscultation bilaterally, no wheezes, rales or rhonchi, good air movement  CV: HRRR, no peripheral edema  MS: moves all extremities without noticeable abnormality Normal Gait Normal inspection of back, no obvious scoliosis or leg length descrepancy No bony TTP Soft tissue TTP at: L lumbar paraspinal muscles -/+ tests: neg trendelenburg,-facet loading, -SLRT, -CLRT, -FABER, -FADIR Normal muscle  strength, sensation to light touch and DTRs in LEs bilaterally  PSYCH: pleasant and cooperative, no obvious depression or anxiety  ASSESSMENT AND PLAN:  Discussed the following assessment and plan:  DIABETES MELLITUS, TYPE II  Hyperlipidemia LDL goal < 100  Essential hypertension, benign  Back pain  -advised to restart pravastatin -HEP, conservative care for back pain which seem muscular or possibly mild radiculopathy but no nerve deficits on exam - follow up scheduled in several weeks and will re-eval then or advised to call if worsening -labs at physical as has not been 90 days -refill norvasc -Patient advised to return or notify a doctor immediately if symptoms worsen or persist or new concerns arise.  Patient Instructions  -restart pravastatin  -heat for 15 minutes twice daily  -topical sports creams, ibuprofen or tylenol as directed for pain as needed  -exercises provided  -follow up at scheduled physical or sooner if concerns     Victoria Neal.

## 2012-12-09 NOTE — Progress Notes (Signed)
Pre visit review using our clinic review tool, if applicable. No additional management support is needed unless otherwise documented below in the visit note. 

## 2012-12-17 ENCOUNTER — Encounter (HOSPITAL_COMMUNITY): Payer: Self-pay | Admitting: Emergency Medicine

## 2012-12-17 ENCOUNTER — Emergency Department (HOSPITAL_COMMUNITY): Payer: BC Managed Care – PPO

## 2012-12-17 ENCOUNTER — Emergency Department (HOSPITAL_COMMUNITY)
Admission: EM | Admit: 2012-12-17 | Discharge: 2012-12-18 | Disposition: A | Discharge status: Home / Self Care | Payer: BC Managed Care – PPO | Attending: Emergency Medicine | Admitting: Emergency Medicine

## 2012-12-17 DIAGNOSIS — M810 Age-related osteoporosis without current pathological fracture: Secondary | ICD-10-CM | POA: Insufficient documentation

## 2012-12-17 DIAGNOSIS — Z79899 Other long term (current) drug therapy: Secondary | ICD-10-CM | POA: Insufficient documentation

## 2012-12-17 DIAGNOSIS — J309 Allergic rhinitis, unspecified: Secondary | ICD-10-CM | POA: Insufficient documentation

## 2012-12-17 DIAGNOSIS — H409 Unspecified glaucoma: Secondary | ICD-10-CM | POA: Insufficient documentation

## 2012-12-17 DIAGNOSIS — R079 Chest pain, unspecified: Secondary | ICD-10-CM

## 2012-12-17 DIAGNOSIS — R112 Nausea with vomiting, unspecified: Secondary | ICD-10-CM | POA: Insufficient documentation

## 2012-12-17 DIAGNOSIS — Z888 Allergy status to other drugs, medicaments and biological substances status: Secondary | ICD-10-CM | POA: Insufficient documentation

## 2012-12-17 DIAGNOSIS — I10 Essential (primary) hypertension: Secondary | ICD-10-CM | POA: Insufficient documentation

## 2012-12-17 DIAGNOSIS — Z8719 Personal history of other diseases of the digestive system: Secondary | ICD-10-CM | POA: Insufficient documentation

## 2012-12-17 DIAGNOSIS — F438 Other reactions to severe stress: Secondary | ICD-10-CM | POA: Insufficient documentation

## 2012-12-17 DIAGNOSIS — E119 Type 2 diabetes mellitus without complications: Secondary | ICD-10-CM | POA: Insufficient documentation

## 2012-12-17 DIAGNOSIS — E781 Pure hyperglyceridemia: Secondary | ICD-10-CM | POA: Insufficient documentation

## 2012-12-17 DIAGNOSIS — F411 Generalized anxiety disorder: Secondary | ICD-10-CM | POA: Insufficient documentation

## 2012-12-17 DIAGNOSIS — F419 Anxiety disorder, unspecified: Secondary | ICD-10-CM

## 2012-12-17 DIAGNOSIS — R0602 Shortness of breath: Secondary | ICD-10-CM | POA: Insufficient documentation

## 2012-12-17 DIAGNOSIS — F4389 Other reactions to severe stress: Secondary | ICD-10-CM | POA: Insufficient documentation

## 2012-12-17 HISTORY — DX: Pure hypercholesterolemia, unspecified: E78.00

## 2012-12-17 LAB — PRO B NATRIURETIC PEPTIDE: Pro B Natriuretic peptide (BNP): 27.5 pg/mL (ref 0–125)

## 2012-12-17 LAB — BASIC METABOLIC PANEL
CO2: 21 mEq/L (ref 19–32)
Calcium: 9.3 mg/dL (ref 8.4–10.5)
GFR calc non Af Amer: >90 mL/min (ref >90)
Sodium: 138 mEq/L (ref 135–145)

## 2012-12-17 LAB — CBC
MCH: 29.8 pg (ref 26.0–34.0)
MCHC: 34.6 g/dL (ref 30.0–36.0)
Platelets: 270 10*3/uL (ref 150–400)
RBC: 4.86 MIL/uL (ref 3.87–5.11)

## 2012-12-17 LAB — POCT I-STAT TROPONIN I
Troponin i, poc: 0 ng/mL (ref 0.00–0.08)
Troponin i, poc: 0 ng/mL (ref 0.00–0.08)

## 2012-12-17 MED ORDER — LORAZEPAM 2 MG/ML IJ SOLN
1.0000 mg | Freq: Once | INTRAMUSCULAR | Status: AC
  Administered 2012-12-17: 1 mg via INTRAVENOUS
  Filled 2012-12-17: qty 1

## 2012-12-17 MED ORDER — ALPRAZOLAM 0.5 MG PO TABS: 0.5000 mg | ORAL_TABLET | Freq: Every evening | ORAL | Status: DC | PRN

## 2012-12-17 NOTE — ED Notes (Signed)
Pt is on the monitor.

## 2012-12-17 NOTE — ED Notes (Signed)
Pt given 2 extra pillows for comfort.

## 2012-12-17 NOTE — ED Provider Notes (Addendum)
CSN: [id number]     Arrival date & time 12/17/12  1753 History   First MD Initiated Contact with Patient 12/17/12 1849     Chief Complaint  Patient presents with  . Chest Pain   (Consider location/radiation/quality/duration/timing/severity/associated sxs/prior Treatment) HPI Comments: Patient presents to the ER for evaluation of chest pain. Patient reports that she had onset of pain when she was at home cleaning her house. Patient has been under a lot of stress. She reports that her child graduated today and has been a very stressful week. She was trying to get the house ready for many family members to come over when the symptoms began. The pain is sharp in the left side of her chest. She feels very anxious. She feels somewhat short of breath, no nausea or diaphoresis.  Patient is a 57 y.o. female presenting with chest pain.  Chest Pain Associated symptoms: shortness of breath     Past Medical History  Diagnosis Date  . DM (diabetes mellitus)   . Pure hyperglyceridemia   . HTN (hypertension)   . Allergic rhinitis   . Internal hemorrhoids   . Glaucoma     ? of, but seeing optho and told recently not glaucoma  . Osteoporosis     in 2010, has refused bisphosphnate treatment or treament other then vit d and calcium  . High cholesterol    Past Surgical History  Procedure Laterality Date  . Colonoscopy  05/03/2006    internal hemorrhoids   Family History  Problem Relation Age of Onset  . Coronary artery disease Maternal Uncle   . Lung cancer Sister   . Cancer Sister     lung   . Breast cancer Maternal Aunt   . Colon cancer Mother     deceased age 36  . Cancer Mother 46  . Hypertension Mother   . Diabetes Father   . Hypertension Father   . Diabetes Paternal Aunt    History  Substance Use Topics  . Smoking status: Never Smoker   . Smokeless tobacco: Not on file  . Alcohol Use: Yes     Comment: occ - very rare   OB History   Grav Para Term Preterm Abortions TAB SAB Ect  Mult Living                 Review of Systems  Respiratory: Positive for shortness of breath.   Cardiovascular: Positive for chest pain.  All other systems reviewed and are negative.    Allergies  Aspirin; Lisinopril; and Statins  Home Medications   Current Outpatient Rx  Name  Route  Sig  Dispense  Refill  . amLODipine (NORVASC) 5 MG tablet   Oral   Take 1 tablet (5 mg total) by mouth daily.   90 tablet   1   . metFORMIN (GLUCOPHAGE-XR) 500 MG 24 hr tablet   Oral   Take 1 tablet (500 mg total) by mouth 2 (two) times daily.   60 tablet   5   . pravastatin (PRAVACHOL) 40 MG tablet   Oral   Take 1 tablet (40 mg total) by mouth daily.   90 tablet   0     Dosage change; please d/c 20 mg! Thanks!   . Specialty Vitamins Products (ONE-A-DAY BONE STRENGTH PO)   Oral   Take 1 tablet by mouth daily.           BP 131/81  Pulse 78  Temp(Src) 97.8 F (36.6 C) (Oral)  Resp 18  SpO2 99% Physical Exam  Constitutional: She is oriented to person, place, and time. She appears well-developed and well-nourished. No distress.  HENT:  Head: Normocephalic and atraumatic.  Right Ear: Hearing normal.  Left Ear: Hearing normal.  Nose: Nose normal.  Mouth/Throat: Oropharynx is clear and moist and mucous membranes are normal.  Eyes: Conjunctivae and EOM are normal. Pupils are equal, round, and reactive to light.  Neck: Normal range of motion. Neck supple.  Cardiovascular: Regular rhythm, S1 normal and S2 normal.  Exam reveals no gallop and no friction rub.   No murmur heard. Pulmonary/Chest: Effort normal and breath sounds normal. No respiratory distress. She exhibits no tenderness.  Abdominal: Soft. Normal appearance and bowel sounds are normal. There is no hepatosplenomegaly. There is no tenderness. There is no rebound, no guarding, no tenderness at McBurney's point and negative Murphy's sign. No hernia.  Musculoskeletal: Normal range of motion.  Neurological: She is alert and  oriented to person, place, and time. She has normal strength. No cranial nerve deficit or sensory deficit. Coordination normal. GCS eye subscore is 4. GCS verbal subscore is 5. GCS motor subscore is 6.  Skin: Skin is warm, dry and intact. No rash noted. No cyanosis.  Psychiatric: She has a normal mood and affect. Her speech is normal and behavior is normal. Thought content normal.    ED Course  Procedures (including critical care time) Labs Review Labs Reviewed  BASIC METABOLIC PANEL - Abnormal; Notable for the following:    Potassium 3.3 (*)    Glucose, Bld 160 (*)    All other components within normal limits  CBC  PRO B NATRIURETIC PEPTIDE  POCT I-STAT TROPONIN I  POCT I-STAT TROPONIN I   Imaging Review Dg Chest 2 View  12/17/2012   CLINICAL DATA:  Chest pain with nausea and vomiting.  EXAM: CHEST  2 VIEW  COMPARISON:  06/09/2012.  FINDINGS: The heart size and mediastinal contours are within normal limits. Both lungs are clear. The visualized skeletal structures are unremarkable. Improved aeration from priors.  IMPRESSION: No active cardiopulmonary disease.   Electronically Signed   By: Davonna Belling M.D.   On: 12/17/2012 19:16    EKG Interpretation   None       MDM   1. Chest pain    Patient presents to the ER with complaints of chest pain. Patient is extremely anxious on arrival. She reports this has been under a lot of stress. She does have cardiac risk factors, but presentation is atypical. Initial EKG is unchanged from previous EKGs. Troponin was negative. Patient monitored for a period of time here in the ER and repeat troponin is still negative. It is felt that the patient is low risk for cardiac etiology of her pain and will be discharged to followup with primary doctor. Return to the ER if her symptoms worsen.  Addendum the patient's pain resolved and her workup was negative. Upon discharge, however, she started to have sharp pain in the center of the chest. This pain  only occurs when she stands up. It is not related to exertion, but the upright position. I suspected GI symptoms. Patient declined a GI cocktail because she has had problems with lidocaine. She was given Maalox. She now remembers having similar symptoms to this in the past that responded to oral antacids. I did offer the patient hospitalization because she does have cardiac risk factors and recurrent chest pain, but she declines. She states she wants to just  go home and she will followup with her doctor.  Gilda Crease, MD 12/17/12 2956  Gilda Crease, MD 12/18/12 [phone]

## 2012-12-17 NOTE — ED Notes (Signed)
Pt reports onset of left side chest pain, squeezing sensation, started this afternoon. Having sob, pt very anxious at triage. ekg done. Airway intact.

## 2012-12-17 NOTE — ED Notes (Signed)
Patient needed to use restroom requested that husband assisst her on bedpan. Gave husband all supplies for bedpan use and told him to call me when she was done

## 2012-12-17 NOTE — ED Notes (Signed)
Pt placed on bedpan with assistance from PT

## 2012-12-18 MED ORDER — GI COCKTAIL ~~LOC~~
30.0000 mL | Freq: Once | ORAL | Status: DC
  Filled 2012-12-18: qty 30

## 2012-12-18 MED ORDER — ALUM & MAG HYDROXIDE-SIMETH 200-200-20 MG/5ML PO SUSP
30.0000 mL | Freq: Once | ORAL | Status: AC
  Administered 2012-12-18: 30 mL via ORAL
  Filled 2012-12-18: qty 30

## 2012-12-18 NOTE — ED Notes (Signed)
Pt states when she stood up she started having worsening pain and was concerned about leaving.  MD made aware and stated he would come talk to pt

## 2012-12-26 ENCOUNTER — Ambulatory Visit: Payer: BC Managed Care – PPO | Admitting: Family Medicine

## 2013-01-03 ENCOUNTER — Encounter: Payer: Self-pay | Admitting: Family Medicine

## 2013-01-03 ENCOUNTER — Ambulatory Visit (INDEPENDENT_AMBULATORY_CARE_PROVIDER_SITE_OTHER): Payer: BC Managed Care – PPO | Admitting: Family Medicine

## 2013-01-03 VITALS — BP 110/74 | Temp 99.1°F | Wt 128.0 lb

## 2013-01-03 DIAGNOSIS — M810 Age-related osteoporosis without current pathological fracture: Secondary | ICD-10-CM

## 2013-01-03 DIAGNOSIS — R0789 Other chest pain: Secondary | ICD-10-CM

## 2013-01-03 DIAGNOSIS — E785 Hyperlipidemia, unspecified: Secondary | ICD-10-CM

## 2013-01-03 DIAGNOSIS — I10 Essential (primary) hypertension: Secondary | ICD-10-CM

## 2013-01-03 DIAGNOSIS — E119 Type 2 diabetes mellitus without complications: Secondary | ICD-10-CM

## 2013-01-03 DIAGNOSIS — F410 Panic disorder [episodic paroxysmal anxiety] without agoraphobia: Secondary | ICD-10-CM

## 2013-01-03 DIAGNOSIS — Z0000 Encounter for general adult medical examination without abnormal findings: Secondary | ICD-10-CM

## 2013-01-03 DIAGNOSIS — F411 Generalized anxiety disorder: Secondary | ICD-10-CM | POA: Insufficient documentation

## 2013-01-03 HISTORY — DX: Panic disorder (episodic paroxysmal anxiety): F41.0

## 2013-01-03 NOTE — Patient Instructions (Addendum)
-  We placed a referral for you as discussed for the exercise stress test. It usually takes about 1-2 weeks to process and schedule this referral. If you have not heard from [place] regarding this appointment in 2 weeks please contact our office.  -please schedule a counseling appointment to help you manage stress  -please try the xanax one tablet if you have another panic attack  -follow up in 2-3 months for your cholesterol and diabetes  -vitamin D3 1000 IU daily and total of 1200mg  calcium in diet and supplement dailly   We recommend the following healthy lifestyle measures: - eat a healthy diet consisting of lots of vegetables, fruits, beans, nuts, seeds, healthy meats such as white chicken and fish and whole grains.  - avoid fried foods, fast food, processed foods, sodas, red meet and other fattening foods.  - get a least 150 minutes of aerobic exercise per week.

## 2013-01-03 NOTE — Progress Notes (Signed)
Chief Complaint  Patient presents with  . Annual Exam    HPI:  Here for CPE and follow up of the following:  Chest pain: -evaluated in ED 12/17/2012 and felt to be anxiety or heartburn - notes reviewed -troponins, ekg, and cxr ok -she reports: relieved with Maalox and "relaxing medicine" in the past she thinks it is stress/panic attack as gets this when under a lot of stress -denies: no symptoms since  Low back pain: -started a few days before last visit (about 1 month ago) -advised HEP, conservative care -she reports: back pain is completely resolved  HLD with LDL goal <100: -advised restarting her statin last visit -she reports: tolerating well  Diabetes: -on metformin bid -working on diet and exercise  -Diet: really working on this, decreased carbs and sugars  -Vit D and calcium:  NOTE: has osteoporosis but has refused tx other then calcium and vit d  -Exercise: 30 minute CV elliptical exercise 4 days per week  -Diabetes and Dyslipidemia Screening: treated  -Hx of HTN: yes, treated  -Vaccines: UTD except flu - reports she had this at school in october  -pap history: last pap 12/2011 and normal, due 2016  -FDLMP: N/A  -wants STI testing: no  -FH breast, colon or ovarian ca: see FH -colonoscopy in 2010, due in 2020 -had mammo 12/2011 - has this scheduled  -Alcohol, Tobacco, drug use: see social history  Review of Systems - Review of Systems  Constitutional: Negative for fever, chills, weight loss and malaise/fatigue.  HENT: Negative for hearing loss.   Eyes: Negative for blurred vision and double vision.  Respiratory: Negative for shortness of breath.   Cardiovascular: Negative for chest pain, palpitations and leg swelling.  Gastrointestinal: Negative for vomiting, abdominal pain, blood in stool and melena.  Genitourinary: Negative for dysuria and frequency.  Musculoskeletal: Negative for falls.  Skin: Negative for rash.  Neurological: Negative for  dizziness, sensory change and focal weakness.  Endo/Heme/Allergies: Does not bruise/bleed easily.  Psychiatric/Behavioral: Negative for depression, memory loss and substance abuse.     Past Medical History  Diagnosis Date  . DM (diabetes mellitus)   . Pure hyperglyceridemia   . HTN (hypertension)   . Allergic rhinitis   . Internal hemorrhoids   . Glaucoma     ? of, but seeing optho and told recently not glaucoma  . Osteoporosis     in 2010, has refused bisphosphnate treatment or treament other then vit d and calcium  . High cholesterol     Past Surgical History  Procedure Laterality Date  . Colonoscopy  05/03/2006    internal hemorrhoids    Family History  Problem Relation Age of Onset  . Coronary artery disease Maternal Uncle   . Lung cancer Sister   . Cancer Sister     lung   . Breast cancer Maternal Aunt   . Colon cancer Mother     deceased age 20  . Cancer Mother 16  . Hypertension Mother   . Diabetes Father   . Hypertension Father   . Diabetes Paternal Aunt     History   Social History  . Marital Status: Married    Spouse Name: N/A    Number of Children: N/A  . Years of Education: N/A   Social History Main Topics  . Smoking status: Never Smoker   . Smokeless tobacco: None  . Alcohol Use: Yes     Comment: occ - very rare  . Drug Use: No  .  Sexual Activity: None   Other Topics Concern  . 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 [employer] Situation: lives with husband and son - has 4 children, son in Dealer school, son animation, daughter is Radio producer      Spiritual Beliefs: Baha'i faith - all religions are one, prayer based, no health care restrictions      Lifestyle: no CV exercise; diet is good             Current outpatient prescriptions:ALPRAZolam (XANAX) 0.5 MG tablet, Take 1 tablet (0.5 mg total) by mouth at bedtime as needed for anxiety., Disp: 10 tablet, Rfl: 0;  amLODipine (NORVASC) 5 MG tablet,  Take 1 tablet (5 mg total) by mouth daily., Disp: 90 tablet, Rfl: 1;  metFORMIN (GLUCOPHAGE-XR) 500 MG 24 hr tablet, Take 1 tablet (500 mg total) by mouth 2 (two) times daily., Disp: 60 tablet, Rfl: 5 pravastatin (PRAVACHOL) 40 MG tablet, Take 1 tablet (40 mg total) by mouth daily., Disp: 90 tablet, Rfl: 0;  Specialty Vitamins Products (ONE-A-DAY BONE STRENGTH PO), Take 1 tablet by mouth daily. , Disp: , Rfl: ;  FREESTYLE LITE test strip, , Disp: , Rfl: ;  Lancets (FREESTYLE) lancets, , Disp: , Rfl:   EXAM:  Filed Vitals:   01/03/13 1617  BP: 110/74  Temp: 99.1 F (37.3 C)    GENERAL: vitals reviewed and listed below, alert, oriented, appears well hydrated and in no acute distress  HEENT: head atraumatic, PERRLA, normal appearance of eyes, ears, nose and mouth. moist mucus membranes.  NECK: supple, no masses or lymphadenopathy  LUNGS: clear to auscultation bilaterally, no rales, rhonchi or wheeze  CV: HRRR, no peripheral edema or cyanosis, normal pedal pulses  BREAST: normal appearance - no lesions or discharge, on palpation normal breast tissue without any suspicious masses  ABDOMEN: bowel sounds normal, soft, non tender to palpation, no masses, no rebound or guarding  GU: deferred  RECTAL: deferred  SKIN: no rash or abnormal lesions  MS: normal gait, moves all extremities normally  NEURO: CN II-XII grossly intact, normal muscle strength and sensation to light touch on extremities  PSYCH: normal affect, pleasant and cooperative  ASSESSMENT AND PLAN:  Discussed the following assessment and plan:  Atypical chest pain - Plan: Exercise tolerance test  DIABETES MELLITUS, TYPE II  Hyperlipidemia LDL goal < 100  Essential hypertension, benign  Osteoporosis, unspecified - refuses medications for this, doing vit D, calcium and exercise  Visit for preventive health examination  Anxiety state, unspecified  Panic anxiety syndrome  -offered stress test for atyipical cp  that is more likely anxiety, discussed importance of managing HTN, HLD  -advised counseling (provided number for her to call) for anxiety and trial of xanax if another panic attack (rx given in ED)  -advised calcium, vit D and weightbearing exercise for osteoporosis and again discussed bisphosphonates which she does not wish to take  -she had her flu shot elsewhere  -Discussed and advised all [place] preventive services health task force level A and B recommendations for age, sex and risks.  -Advised at least 150 minutes of exercise per week and a healthy diet low in saturated fats and sweets and consisting of fresh fruits and vegetables, lean meats such as fish and white chicken and whole grains.  -labs, studies and vaccines per orders this encounter  Orders Placed This Encounter  Procedures  . Exercise tolerance test  Standing Status: Future     Number of Occurrences:      Standing Expiration Date: 01/03/2014    Scheduling Instructions:     Atypical chest pain    Order Specific Question:  Where should this test be performed    Answer:  [facility]    Patient Instructions  -We placed a referral for you as discussed for the exercise stress test. It usually takes about 1-2 weeks to process and schedule this referral. If you have not heard from [place] regarding this appointment in 2 weeks please contact our office.  -please schedule a counseling appointment to help you manage stress  -please try the xanax one tablet if you have another panic attack  -follow up in 2-3 months for your cholesterol and diabetes  -vitamin D3 1000 IU daily and total of 1200mg  calcium in diet and supplement dailly   We recommend the following healthy lifestyle measures: - eat a healthy diet consisting of lots of vegetables, fruits, beans, nuts, seeds, healthy meats such as white chicken and fish and whole grains.  - avoid fried foods, fast food, processed foods, sodas, red meet and other fattening foods.  -  get a least 150 minutes of aerobic exercise per week.    Patient Instructions  -We placed a referral for you as discussed for the exercise stress test. It usually takes about 1-2 weeks to process and schedule this referral. If you have not heard from [place] regarding this appointment in 2 weeks please contact our office.  -please schedule a counseling appointment to help you manage stress  -please try the xanax one tablet if you have another panic attack  -follow up in 2-3 months for your cholesterol and diabetes   We recommend the following healthy lifestyle measures: - eat a healthy diet consisting of lots of vegetables, fruits, beans, nuts, seeds, healthy meats such as white chicken and fish and whole grains.  - avoid fried foods, fast food, processed foods, sodas, red meet and other fattening foods.  - get a least 150 minutes of aerobic exercise per week.       Patient advised to return to clinic immediately if symptoms worsen or persist or new concerns.  @LIFEPLAN @  Return in about 2 months (around 03/04/2013) for follow up dM, HLD, anxiety.  Victoria Basque R.

## 2013-01-03 NOTE — Progress Notes (Signed)
Pre visit review using our clinic review tool, if applicable. No additional management support is needed unless otherwise documented below in the visit note. 

## 2013-01-20 ENCOUNTER — Ambulatory Visit
Admission: RE | Admit: 2013-01-20 | Discharge: 2013-01-20 | Disposition: A | Discharge status: Home / Self Care | Payer: BC Managed Care – PPO | Source: Ambulatory Visit

## 2013-01-20 DIAGNOSIS — Z1231 Encounter for screening mammogram for malignant neoplasm of breast: Secondary | ICD-10-CM

## 2013-01-31 ENCOUNTER — Ambulatory Visit (INDEPENDENT_AMBULATORY_CARE_PROVIDER_SITE_OTHER): Payer: BC Managed Care – PPO | Admitting: Nurse Practitioner

## 2013-01-31 ENCOUNTER — Encounter: Payer: Self-pay | Admitting: Nurse Practitioner

## 2013-01-31 ENCOUNTER — Encounter (INDEPENDENT_AMBULATORY_CARE_PROVIDER_SITE_OTHER): Payer: Self-pay

## 2013-01-31 VITALS — BP 168/61 | HR 62

## 2013-01-31 DIAGNOSIS — R0789 Other chest pain: Secondary | ICD-10-CM

## 2013-01-31 NOTE — Progress Notes (Signed)
Exercise Treadmill Test  Pre-Exercise Testing Evaluation Rhythm: normal sinus  Rate: 65 bpm     Test  Exercise Tolerance Test Ordering MD: Candee Furbish, MD  Interpreting MD: Truitt Merle, NP  Unique Test No: 1  Treadmill:  1  Indication for ETT: chest pain - rule out ischemia  Contraindication to ETT: No   Stress Modality: exercise - treadmill  Cardiac Imaging Performed: non   Protocol: standard Bruce - maximal  Max BP:  203/95  Max MPHR (bpm):  161 85% MPR (bpm):  137  MPHR obtained (bpm):  137 % MPHR obtained:  85%  Reached 85% MPHR (min:sec):  8:10 Total Exercise Time (min-sec):  8:15  Workload in METS:  10.1 Borg Scale: 18  Reason ETT Terminated:  patient's desire to stop    ST Segment Analysis At Rest: normal ST segments - no evidence of significant ST depression With Exercise: no evidence of significant ST depression  Other Information Arrhythmia:  No Angina during ETT:  absent (0) Quality of ETT:  diagnostic  ETT Interpretation:  normal - no evidence of ischemia by ST analysis  Comments: Patient presents today for routine GXT. Has had recurrent chest pain in the setting of DM, HTN and HLD.   Today the patient exercised on the standard Bruce protocol for a total of 8:15 minutes.  Good exercise tolerance.  Adequate blood pressure response.  Clinically negative for chest pain. Test was stopped due to fatigue/achievement of target HR.  EKG negative for ischemia. No significant arrhythmia noted.   Recommendations: CV risk factor modification  See back prn.  Patient is agreeable to this plan and will call if any problems develop in the interim.   Burtis Junes, RN, [facility] [address] [place], [place]  [phone]

## 2013-03-21 ENCOUNTER — Telehealth: Payer: Self-pay | Admitting: Family Medicine

## 2013-03-21 NOTE — Telephone Encounter (Signed)
[facility] [place], [place] - [address] Requesting re-fill on pravastatin (PRAVACHOL) 40 MG tablet

## 2013-03-22 MED ORDER — PRAVASTATIN SODIUM 40 MG PO TABS: 40.0000 mg | ORAL_TABLET | Freq: Every day | ORAL | Status: DC

## 2013-03-22 NOTE — Telephone Encounter (Signed)
Rx sent to pharmacy.  Pt had annual exam in Dec 2014.

## 2013-03-29 ENCOUNTER — Ambulatory Visit (INDEPENDENT_AMBULATORY_CARE_PROVIDER_SITE_OTHER): Payer: BC Managed Care – PPO | Admitting: Family Medicine

## 2013-03-29 ENCOUNTER — Encounter: Payer: Self-pay | Admitting: Family Medicine

## 2013-03-29 VITALS — BP 110/70 | Temp 103.0°F | Wt 129.0 lb

## 2013-03-29 DIAGNOSIS — J069 Acute upper respiratory infection, unspecified: Secondary | ICD-10-CM

## 2013-03-29 DIAGNOSIS — B9789 Other viral agents as the cause of diseases classified elsewhere: Principal | ICD-10-CM

## 2013-03-29 MED ORDER — HYDROCODONE-HOMATROPINE 5-1.5 MG/5ML PO SYRP: 5.0000 mL | ORAL_SOLUTION | Freq: Three times a day (TID) | ORAL | Status: DC | PRN

## 2013-03-29 NOTE — Patient Instructions (Signed)
Hydromet,,,,,, 1/2-1 teaspoon 3 times daily when necessary for cough and cold  Rest at home  Drink lots of water  Tylenol,,,,,,,,, 2 tabs every 4-6 hours when necessary for fever and chills  Continue your amlodipine and metformin  Hold the Pravachol until the virus has abated  Return when necessary

## 2013-03-29 NOTE — Progress Notes (Signed)
Pre visit review using our clinic review tool, if applicable. No additional management support is needed unless otherwise documented below in the visit note. 

## 2013-03-29 NOTE — Progress Notes (Signed)
   Subjective:    Patient ID: Victoria Neal, female    DOB: 03/29/1955, 58 y.o.   MRN: [id number]  HPI Victoria Neal is a 58 year old married female nonsmoker........ teaches second grade...Marland KitchenMarland KitchenMarland Kitchen who comes in today for with a three-day history of head congestion sore throat cough and fever and chills  A number of her students have been a was similar viral infections   Review of Systems    negative Objective:   Physical Exam  Well-developed well-nourished female no acute distress vital signs stable except temp 103.1  HEENT negative neck was supple no adenopathy lungs are clear      Assessment & Plan:  Viral syndrome plan treat symptomatically

## 2013-05-31 ENCOUNTER — Telehealth: Payer: Self-pay | Admitting: Family Medicine

## 2013-05-31 MED ORDER — METFORMIN HCL ER 500 MG PO TB24: 500.0000 mg | ORAL_TABLET | Freq: Two times a day (BID) | ORAL | Status: DC

## 2013-05-31 NOTE — Telephone Encounter (Signed)
[facility] [place], [place] - [address] Is requesting re-fill on metFORMIN (GLUCOPHAGE-XR) 500 MG 24 hr tablet

## 2013-05-31 NOTE — Telephone Encounter (Signed)
Refill sent to pts pharmacy and the pt needs an appt per 01/03/2013 note to follow up on cholesterol and diabetes.

## 2013-05-31 NOTE — Telephone Encounter (Signed)
Opened in error

## 2013-07-10 ENCOUNTER — Ambulatory Visit (INDEPENDENT_AMBULATORY_CARE_PROVIDER_SITE_OTHER): Payer: BC Managed Care – PPO | Admitting: Family Medicine

## 2013-07-10 ENCOUNTER — Encounter: Payer: Self-pay | Admitting: Family Medicine

## 2013-07-10 VITALS — BP 124/80 | HR 71 | Temp 98.9°F | Ht 63.25 in | Wt 133.0 lb

## 2013-07-10 DIAGNOSIS — E119 Type 2 diabetes mellitus without complications: Secondary | ICD-10-CM

## 2013-07-10 DIAGNOSIS — E785 Hyperlipidemia, unspecified: Secondary | ICD-10-CM

## 2013-07-10 DIAGNOSIS — I10 Essential (primary) hypertension: Secondary | ICD-10-CM

## 2013-07-10 MED ORDER — METFORMIN HCL ER 500 MG PO TB24: 500.0000 mg | ORAL_TABLET | Freq: Two times a day (BID) | ORAL | Status: DC

## 2013-07-10 MED ORDER — PRAVASTATIN SODIUM 40 MG PO TABS: 40.0000 mg | ORAL_TABLET | Freq: Every day | ORAL | Status: DC

## 2013-07-10 MED ORDER — AMLODIPINE BESYLATE 5 MG PO TABS: 5.0000 mg | ORAL_TABLET | Freq: Every day | ORAL | Status: DC

## 2013-07-10 NOTE — Progress Notes (Signed)
No chief complaint on file.   HPI:  Diabetes Mellitus: -meds: 500mg  bid metformin XR -home BS: only check occasionally - ave 125 fasting -diet and exercise: yoga and elliptical and weights (almost daily); diet is ok - watching portions - but does eat some cake, cut out cookies and soda -last eye exam:  Goes yearly -denies: foot lesions, vision changes, polyuria, polydipsia, hypglycemia Lab Results  Component Value Date   HGBA1C 7.0* 09/27/2012    HLD: -meds: pravastatin -stable Lab Results  Component Value Date   CHOL 252* 09/27/2012   HDL 60.90 09/27/2012   LDLDIRECT 145.0 09/27/2012   TRIG 300.0* 09/27/2012   CHOLHDL 4 09/27/2012    GAD/Panic disorder: -caused cp and ED visit in the past where she was prescribed xanax -reports: has done fine and has not used benzo and does not want to take it -denies: CP, panic, anciety  Last physical: 12/2012  ROS: See pertinent positives and negatives per HPI.  Past Medical History  Diagnosis Date  . DM (diabetes mellitus)   . Pure hyperglyceridemia   . HTN (hypertension)   . Allergic rhinitis   . Internal hemorrhoids   . Glaucoma     ? of, but seeing optho and told recently not glaucoma  . Osteoporosis     in 2010, has refused bisphosphnate treatment or treament other then vit d and calcium  . High cholesterol     Past Surgical History  Procedure Laterality Date  . Colonoscopy  05/03/2006    internal hemorrhoids    Family History  Problem Relation Age of Onset  . Coronary artery disease Maternal Uncle   . Lung cancer Sister   . Cancer Sister     lung   . Breast cancer Maternal Aunt   . Colon cancer Mother     deceased age 25  . Cancer Mother 44  . Hypertension Mother   . Diabetes Father   . Hypertension Father   . Diabetes Paternal Aunt     History   Social History  . Marital Status: Married    Spouse Name: N/A    Number of Children: N/A  . Years of Education: N/A   Social History Main Topics  . Smoking  status: Never Smoker   . Smokeless tobacco: None  . Alcohol Use: Yes     Comment: occ - very rare  . Drug Use: No  . Sexual Activity: None   Other Topics Concern  . 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 [employer] Situation: lives with husband and son - has 4 children, son in Facilities manager school, son animation, daughter is Pension scheme manager      Spiritual Beliefs: Baha'i faith - all religions are one, prayer based, no health care restrictions      Lifestyle: no CV exercise; diet is good             Current outpatient prescriptions:amLODipine (NORVASC) 5 MG tablet, Take 1 tablet (5 mg total) by mouth daily., Disp: 90 tablet, Rfl: 3;  FREESTYLE LITE test strip, , Disp: , Rfl: ;  Lancets (FREESTYLE) lancets, , Disp: , Rfl: ;  metFORMIN (GLUCOPHAGE-XR) 500 MG 24 hr tablet, Take 1 tablet (500 mg total) by mouth 2 (two) times daily., Disp: 120 tablet, Rfl: 3 pravastatin (PRAVACHOL) 40 MG tablet, Take 1 tablet (40 mg total) by mouth daily., Disp: 90 tablet, Rfl: 3;  Specialty Vitamins Products (ONE-A-DAY BONE STRENGTH  PO), Take 1 tablet by mouth daily. , Disp: , Rfl:   EXAM:  Filed Vitals:   07/10/13 1342  BP: 124/80  Pulse: 71  Temp: 98.9 F (37.2 C)    Body mass index is 23.36 kg/(m^2).  GENERAL: vitals reviewed and listed above, alert, oriented, appears well hydrated and in no acute distress  HEENT: atraumatic, conjunttiva clear, no obvious abnormalities on inspection of external nose and ears  NECK: no obvious masses on inspection  LUNGS: clear to auscultation bilaterally, no wheezes, rales or rhonchi, good air movement  CV: HRRR, no peripheral edema  MS: moves all extremities without noticeable abnormality  PSYCH: pleasant and cooperative, no obvious depression or anxiety  ASSESSMENT AND PLAN:  Discussed the following assessment and plan:  Hyperlipidemia with target LDL less than 100 - Plan: Lipid panel, pravastatin (PRAVACHOL) 40 MG  tablet  DIABETES MELLITUS, TYPE II - Plan: Hemoglobin O5F, Basic metabolic panel, Microalbumin/Creatinine Ratio, Urine, metFORMIN (GLUCOPHAGE-XR) 500 MG 24 hr tablet  Essential hypertension, benign - Plan: Basic metabolic panel, amLODipine (NORVASC) 5 MG tablet  -Patient advised to return or notify a doctor immediately if symptoms worsen or persist or new concerns arise.  Patient Instructions  -schedule fasting lab appointment  -schedule physical in December  -We have ordered labs or studies at this visit. It can take up to 1-2 weeks for results and processing. We will contact you with instructions IF your results are abnormal. Normal results will be released to your [facility]. If you have not heard from [place] or can not find your results in [facility] in 2 weeks please contact our office.  -PLEASE SIGN UP FOR MYCHART TODAY   We recommend the following healthy lifestyle measures: - eat a healthy diet consisting of lots of vegetables, fruits, beans, nuts, seeds, healthy meats such as white chicken and fish and whole grains.  - avoid fried foods, fast food, processed foods, sodas, red meet and other fattening foods.  - get a least 150 minutes of aerobic exercise per week.         Colin Benton R.

## 2013-07-10 NOTE — Progress Notes (Signed)
Pre visit review using our clinic review tool, if applicable. No additional management support is needed unless otherwise documented below in the visit note. 

## 2013-07-10 NOTE — Patient Instructions (Signed)
-  schedule fasting lab appointment  -schedule physical in December  -We have ordered labs or studies at this visit. It can take up to 1-2 weeks for results and processing. We will contact you with instructions IF your results are abnormal. Normal results will be released to your [facility]. If you have not heard from [place] or can not find your results in [facility] in 2 weeks please contact our office.  -PLEASE SIGN UP FOR MYCHART TODAY   We recommend the following healthy lifestyle measures: - eat a healthy diet consisting of lots of vegetables, fruits, beans, nuts, seeds, healthy meats such as white chicken and fish and whole grains.  - avoid fried foods, fast food, processed foods, sodas, red meet and other fattening foods.  - get a least 150 minutes of aerobic exercise per week.

## 2013-07-11 ENCOUNTER — Telehealth: Payer: Self-pay | Admitting: Family Medicine

## 2013-07-11 ENCOUNTER — Other Ambulatory Visit (INDEPENDENT_AMBULATORY_CARE_PROVIDER_SITE_OTHER): Payer: BC Managed Care – PPO

## 2013-07-11 DIAGNOSIS — I10 Essential (primary) hypertension: Secondary | ICD-10-CM

## 2013-07-11 DIAGNOSIS — E119 Type 2 diabetes mellitus without complications: Secondary | ICD-10-CM

## 2013-07-11 DIAGNOSIS — E785 Hyperlipidemia, unspecified: Secondary | ICD-10-CM

## 2013-07-11 LAB — MICROALBUMIN / CREATININE URINE RATIO
Creatinine,U: 147.1 mg/dL
MICROALB UR: 0.5 mg/dL (ref 0.0–1.9)
Microalb Creat Ratio: 0.3 mg/g (ref 0.0–30.0)

## 2013-07-11 LAB — HEMOGLOBIN A1C: Hgb A1c MFr Bld: 6.5 % (ref 4.6–6.5)

## 2013-07-11 LAB — BASIC METABOLIC PANEL
BUN: 18 mg/dL (ref 6–23)
CALCIUM: 9.4 mg/dL (ref 8.4–10.5)
CO2: 29 mEq/L (ref 19–32)
CREATININE: 0.6 mg/dL (ref 0.4–1.2)
Chloride: 103 mEq/L (ref 96–112)
GFR: 106.49 mL/min (ref >60.00)
Glucose, Bld: 130 mg/dL — ABNORMAL HIGH (ref 70–99)
POTASSIUM: 4.5 meq/L (ref 3.5–5.1)
Sodium: 138 mEq/L (ref 135–145)

## 2013-07-11 LAB — LIPID PANEL
CHOL/HDL RATIO: 3
Cholesterol: 195 mg/dL (ref 0–200)
HDL: 67.1 mg/dL (ref >39.00)
LDL Cholesterol: 85 mg/dL (ref 0–99)
NONHDL: 127.9
Triglycerides: 215 mg/dL — ABNORMAL HIGH (ref 0.0–149.0)
VLDL: 43 mg/dL — AB (ref 0.0–40.0)

## 2013-07-11 LAB — HM DIABETES EYE EXAM

## 2013-07-11 NOTE — Telephone Encounter (Signed)
Relevant patient education assigned to patient using Emmi. ° °

## 2013-07-12 ENCOUNTER — Encounter: Payer: Self-pay | Admitting: *Deleted

## 2013-11-17 ENCOUNTER — Encounter: Payer: Self-pay | Admitting: Family Medicine

## 2013-11-17 ENCOUNTER — Ambulatory Visit (INDEPENDENT_AMBULATORY_CARE_PROVIDER_SITE_OTHER): Payer: BC Managed Care – PPO | Admitting: Family Medicine

## 2013-11-17 VITALS — BP 138/88 | HR 62 | Temp 98.0°F | Ht 63.25 in | Wt 128.2 lb

## 2013-11-17 DIAGNOSIS — E119 Type 2 diabetes mellitus without complications: Secondary | ICD-10-CM

## 2013-11-17 DIAGNOSIS — E785 Hyperlipidemia, unspecified: Secondary | ICD-10-CM

## 2013-11-17 DIAGNOSIS — I10 Essential (primary) hypertension: Secondary | ICD-10-CM

## 2013-11-17 LAB — MICROALBUMIN / CREATININE URINE RATIO
Creatinine,U: 73.1 mg/dL
MICROALB UR: 0.4 mg/dL (ref 0.0–1.9)
Microalb Creat Ratio: 0.5 mg/g (ref 0.0–30.0)

## 2013-11-17 LAB — LIPID PANEL
CHOL/HDL RATIO: 3
Cholesterol: 181 mg/dL (ref 0–200)
HDL: 55.6 mg/dL (ref >39.00)
LDL CALC: 98 mg/dL (ref 0–99)
NONHDL: 125.4
Triglycerides: 135 mg/dL (ref 0.0–149.0)
VLDL: 27 mg/dL (ref 0.0–40.0)

## 2013-11-17 LAB — HEMOGLOBIN A1C: Hgb A1c MFr Bld: 6.2 % (ref 4.6–6.5)

## 2013-11-17 LAB — BASIC METABOLIC PANEL
BUN: 14 mg/dL (ref 6–23)
CALCIUM: 9.4 mg/dL (ref 8.4–10.5)
CO2: 26 mEq/L (ref 19–32)
CREATININE: 0.7 mg/dL (ref 0.4–1.2)
Chloride: 106 mEq/L (ref 96–112)
GFR: 90.74 mL/min (ref >60.00)
Glucose, Bld: 119 mg/dL — ABNORMAL HIGH (ref 70–99)
Potassium: 4.3 mEq/L (ref 3.5–5.1)
Sodium: 140 mEq/L (ref 135–145)

## 2013-11-17 MED ORDER — LOSARTAN POTASSIUM 25 MG PO TABS: 25.0000 mg | ORAL_TABLET | Freq: Every day | ORAL | Status: DC

## 2013-11-17 NOTE — Progress Notes (Signed)
HPI:  Diabetes Mellitus: -meds: 500mg  bid metformin XR -home BS: BS in low 100s fasting -diet and exercise: reports has increased exercise, yoga and elliptical and weights (almost daily); diet is ok - watching portions - but does eat some cake, cut out cookies and soda -last eye exam: Goes yearly -denies: foot lesions, vision changes, polyuria, polydipsia, hypglycemia -foot exam today -flu vaccine offered  HLD: -meds: pravastatin -stable -denies: cog impairment, leg cramps  HTN: -meds: norvasc 5 mg daily -had cough with lisinopril -denies: CP, SOB, DOE, HA  ROS: See pertinent positives and negatives per HPI.  Past Medical History  Diagnosis Date  . DM (diabetes mellitus)   . Pure hyperglyceridemia   . HTN (hypertension)   . Allergic rhinitis   . Internal hemorrhoids   . Glaucoma     ? of, but seeing optho and told recently not glaucoma  . Osteoporosis     in 2010, has refused bisphosphnate treatment or treament other then vit d and calcium  . High cholesterol     Past Surgical History  Procedure Laterality Date  . Colonoscopy  05/03/2006    internal hemorrhoids    Family History  Problem Relation Age of Onset  . Coronary artery disease Maternal Uncle   . Lung cancer Sister   . Cancer Sister     lung   . Breast cancer Maternal Aunt   . Colon cancer Mother     deceased age 60  . Cancer Mother 60  . Hypertension Mother   . Diabetes Father   . Hypertension Father   . Diabetes Paternal Aunt     History   Social History  . Marital Status: Married    Spouse Name: N/A    Number of Children: N/A  . Years of Education: N/A   Social History Main Topics  . Smoking status: Never Smoker   . Smokeless tobacco: None  . Alcohol Use: Yes     Comment: occ - very rare  . Drug Use: No  . Sexual Activity: None   Other Topics Concern  . 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 [employer] Situation: lives with husband  and son - has 4 children, son in Facilities manager school, son animation, daughter is Pension scheme manager      Spiritual Beliefs: Baha'i faith - all religions are one, prayer based, no health care restrictions      Lifestyle: no CV exercise; diet is good             Current outpatient prescriptions: amLODipine (NORVASC) 5 MG tablet, Take 1 tablet (5 mg total) by mouth daily., Disp: 90 tablet, Rfl: 3;  BLACK COHOSH PO, Take by mouth., Disp: , Rfl: ;  CINNAMON PO, Take by mouth., Disp: , Rfl: ;  FIBER PO, Take by mouth., Disp: , Rfl: ;  FREESTYLE LITE test strip, , Disp: , Rfl: ;  Lancets (FREESTYLE) lancets, , Disp: , Rfl: ;  MAGNESIUM PO, Take by mouth., Disp: , Rfl:  metFORMIN (GLUCOPHAGE-XR) 500 MG 24 hr tablet, Take 1 tablet (500 mg total) by mouth 2 (two) times daily., Disp: 120 tablet, Rfl: 3;  NON FORMULARY, Glucocare, Disp: , Rfl: ;  pravastatin (PRAVACHOL) 40 MG tablet, Take 1 tablet (40 mg total) by mouth daily., Disp: 90 tablet, Rfl: 3;  Specialty Vitamins Products (ONE-A-DAY BONE STRENGTH PO), Take 1 tablet by mouth daily. , Disp: , Rfl:  losartan (COZAAR) 25  MG tablet, Take 1 tablet (25 mg total) by mouth daily., Disp: 90 tablet, Rfl: 3  EXAM:  Filed Vitals:   11/17/13 0901  BP: 138/88  Pulse: 62  Temp:     Body mass index is 22.52 kg/(m^2).  GENERAL: vitals reviewed and listed above, alert, oriented, appears well hydrated and in no acute distress  HEENT: atraumatic, conjunttiva clear, no obvious abnormalities on inspection of external nose and ears  NECK: no obvious masses on inspection  LUNGS: clear to auscultation bilaterally, no wheezes, rales or rhonchi, good air movement  CV: HRRR, no peripheral edema  MS: moves all extremities without noticeable abnormality  PSYCH: pleasant and cooperative, no obvious depression or anxiety  ASSESSMENT AND PLAN:  Discussed the following assessment and plan:  Hyperlipemia - Plan: Lipid Panel  Essential hypertension, benign - Plan: losartan  (COZAAR) 25 MG tablet -opted to add low dose losartan to see if tolerates discussed risks/benefits -may stop norvasc if BP low in the future  Type 2 diabetes mellitus without complication - Plan: Hemoglobin R4E, Basic metabolic panel, Microalbumin/Creatinine Ratio, Urine  -Patient advised to return or notify a doctor immediately if symptoms worsen or persist or new concerns arise.  Patient Instructions  BEFORE YOU LEAVE: -labs -schedule physical with pap in 2-3 months  We recommend the following healthy lifestyle measures: - eat a healthy diet consisting of lots of vegetables, fruits, beans, nuts, seeds, healthy meats such as white chicken and fish and whole grains.  - avoid fried foods, fast food, processed foods, sodas, red meet and other fattening foods.  - get a least 150 minutes of aerobic exercise per week.       Victoria Benton R.

## 2013-11-17 NOTE — Progress Notes (Signed)
Pre visit review using our clinic review tool, if applicable. No additional management support is needed unless otherwise documented below in the visit note. 

## 2013-11-17 NOTE — Patient Instructions (Signed)
BEFORE YOU LEAVE: -labs -schedule physical with pap in 2-3 months  We recommend the following healthy lifestyle measures: - eat a healthy diet consisting of lots of vegetables, fruits, beans, nuts, seeds, healthy meats such as white chicken and fish and whole grains.  - avoid fried foods, fast food, processed foods, sodas, red meet and other fattening foods.  - get a least 150 minutes of aerobic exercise per week.

## 2013-12-26 ENCOUNTER — Other Ambulatory Visit: Payer: Self-pay

## 2013-12-26 DIAGNOSIS — Z1231 Encounter for screening mammogram for malignant neoplasm of breast: Secondary | ICD-10-CM

## 2014-01-22 ENCOUNTER — Ambulatory Visit: Payer: BC Managed Care – PPO

## 2014-02-06 ENCOUNTER — Encounter: Payer: BC Managed Care – PPO | Admitting: Family Medicine

## 2014-02-26 ENCOUNTER — Ambulatory Visit: Payer: BC Managed Care – PPO

## 2014-03-12 ENCOUNTER — Ambulatory Visit: Payer: BC Managed Care – PPO

## 2014-03-14 ENCOUNTER — Encounter: Payer: Self-pay | Admitting: Family Medicine

## 2014-03-14 ENCOUNTER — Ambulatory Visit (INDEPENDENT_AMBULATORY_CARE_PROVIDER_SITE_OTHER): Payer: BC Managed Care – PPO | Admitting: Family Medicine

## 2014-03-14 VITALS — BP 130/86 | HR 64 | Temp 98.1°F | Ht 63.0 in | Wt 127.5 lb

## 2014-03-14 DIAGNOSIS — E119 Type 2 diabetes mellitus without complications: Secondary | ICD-10-CM | POA: Diagnosis not present

## 2014-03-14 DIAGNOSIS — I10 Essential (primary) hypertension: Secondary | ICD-10-CM | POA: Diagnosis not present

## 2014-03-14 DIAGNOSIS — E785 Hyperlipidemia, unspecified: Secondary | ICD-10-CM | POA: Diagnosis not present

## 2014-03-14 DIAGNOSIS — Z0000 Encounter for general adult medical examination without abnormal findings: Secondary | ICD-10-CM

## 2014-03-14 DIAGNOSIS — E1159 Type 2 diabetes mellitus with other circulatory complications: Secondary | ICD-10-CM | POA: Insufficient documentation

## 2014-03-14 LAB — LIPID PANEL
Cholesterol: 243 mg/dL — ABNORMAL HIGH (ref 0–200)
HDL: 77.9 mg/dL (ref >39.00)
LDL Cholesterol: 145 mg/dL — ABNORMAL HIGH (ref 0–99)
NonHDL: 165.1
TRIGLYCERIDES: 100 mg/dL (ref 0.0–149.0)
Total CHOL/HDL Ratio: 3
VLDL: 20 mg/dL (ref 0.0–40.0)

## 2014-03-14 LAB — BASIC METABOLIC PANEL
BUN: 14 mg/dL (ref 6–23)
CO2: 30 meq/L (ref 19–32)
CREATININE: 0.63 mg/dL (ref 0.40–1.20)
Calcium: 9.9 mg/dL (ref 8.4–10.5)
Chloride: 103 mEq/L (ref 96–112)
GFR: 102.36 mL/min (ref >60.00)
Glucose, Bld: 114 mg/dL — ABNORMAL HIGH (ref 70–99)
Potassium: 3.9 mEq/L (ref 3.5–5.1)
Sodium: 138 mEq/L (ref 135–145)

## 2014-03-14 LAB — MICROALBUMIN / CREATININE URINE RATIO
Creatinine,U: 42.2 mg/dL
MICROALB/CREAT RATIO: 1.7 mg/g (ref 0.0–30.0)
Microalb, Ur: <0.7 mg/dL (ref 0.0–1.9)

## 2014-03-14 LAB — HEMOGLOBIN A1C: HEMOGLOBIN A1C: 6.7 % — AB (ref 4.6–6.5)

## 2014-03-14 NOTE — Patient Instructions (Addendum)
BEFORE YOU LEAVE: -labs -follow up in 3 months  I advise you to stop the supplements except for Vit D3 [phone] IU daily (advise [facility] brand). Eat lots of herbs, fresh or frozen fruits and vegetables and fish in your diet instead.  We recommend the following healthy lifestyle measures: - eat a healthy diet consisting of lots of vegetables, fruits, beans, nuts, seeds, healthy meats such as white chicken and fish and whole grains.  - avoid fried foods, fast food, processed foods, sodas, red meet and other fattening foods.  - get a least 150 minutes of aerobic exercise per week.    Please get you mammogram  Colonoscopy due in 2018 per gastroenterology letter  Get yearly eye exam

## 2014-03-14 NOTE — Progress Notes (Signed)
Pre visit review using our clinic review tool, if applicable. No additional management support is needed unless otherwise documented below in the visit note. 

## 2014-03-14 NOTE — Progress Notes (Signed)
HPI:  Here for CPE:  -Concerns and/or follow up today:   Diabetes Mellitus: -meds: 500mg  bid metformin XR -home BS: BS in low 100s fasting -diet and exercise: reports has increased exercise, yoga and elliptical and weights (almost daily); diet is ok - watching portions - but does eat some cake, cut out cookies and soda -last eye exam: Goes yearly -denies: foot lesions, vision changes, polyuria, polydipsia, hypglycemia  HLD: -meds: pravastatin - she stopped this - doesn't want to take, taking supplements instead -stable -denies: cog impairment, leg cramps  HTN: -meds: norvasc 5 mg daily -had cough with lisinopril - she thinks she is taking losartan but is not sure will call my assistant to let her know -denies: CP, SOB, DOE, HA  -Diet: variety of foods, balance and well rounded  -Exercise: regular exercise  -Taking folic acid, vitamin D or calcium: no  -Diabetes and Dyslipidemia Screening: FASTING  -Hx of HTN: no  -Vaccines: UTD except shingles  -pap history: reports all normal paps in the past and UTD, she opted to do pap next year  -FDLMP: postmenopausal  -sexual activity: yes, female partner, no new partners  -wants STI testing: no  -FH breast, colon or ovarian ca: see FH Last mammogram: UTD Last colon cancer screening: UTD per her report - but on review of records report recommends repeat 5 years due to [place] - but letter sent due to new guidelines not due until 2018. Last done in 2008  Breast Ca Risk Assessment:  -Alcohol, Tobacco, drug use: see social history  Review of Systems - no fevers, unintentional weight loss, vision loss, hearing loss, chest pain, sob, hemoptysis, melena, hematochezia, hematuria, genital discharge, changing or concerning skin lesions, bleeding, bruising, loc, thoughts of self harm or SI  Past Medical History  Diagnosis Date  . DM (diabetes mellitus)   . Pure hyperglyceridemia   . HTN (hypertension)   . Allergic rhinitis   .  Internal hemorrhoids   . Glaucoma     ? of, but seeing optho and told recently not glaucoma  . Osteoporosis     in 2010, has refused bisphosphnate treatment or treament other then vit d and calcium  . High cholesterol   . Panic anxiety syndrome 01/03/2013    Past Surgical History  Procedure Laterality Date  . Colonoscopy  05/03/2006    internal hemorrhoids    Family History  Problem Relation Age of Onset  . Coronary artery disease Maternal Uncle   . Lung cancer Sister   . Cancer Sister     lung   . Breast cancer Maternal Aunt   . Colon cancer Mother     deceased age 26  . Cancer Mother 41  . Hypertension Mother   . Diabetes Father   . Hypertension Father   . Diabetes Paternal Aunt     History   Social History  . Marital Status: Married    Spouse Name: N/A  . Number of Children: N/A  . Years of Education: N/A   Social History Main Topics  . Smoking status: Never Smoker   . Smokeless tobacco: Not on file  . Alcohol Use: Yes     Comment: occ - very rare  . Drug Use: No  . Sexual Activity: Not on file   Other Topics Concern  . 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 [employer]: lives with husband and son - has 19  children, son in dental school, son animation, daughter is actor      Spiritual Beliefs: Baha'i faith - all religions are one, prayer based, no health care restrictions      Lifestyle: no CV exercise; diet is good              Current outpatient prescriptions:  .  amLODipine (NORVASC) 5 MG tablet, Take 1 tablet (5 mg total) by mouth daily., Disp: 90 tablet, Rfl: 3 .  BLACK COHOSH PO, Take by mouth., Disp: , Rfl:  .  BORON PO, Take by mouth., Disp: , Rfl:  .  CHROMIUM PICOLINATE PO, Take by mouth., Disp: , Rfl:  .  CINNAMON PO, Take by mouth., Disp: , Rfl:  .  FIBER PO, Take by mouth., Disp: , Rfl:  .  FREESTYLE LITE test strip, , Disp: , Rfl:  .  Lancets (FREESTYLE) lancets, , Disp: , Rfl:  .   Liver Extract (LIVER PO), Take by mouth., Disp: , Rfl:  .  metFORMIN (GLUCOPHAGE-XR) 500 MG 24 hr tablet, Take 1 tablet (500 mg total) by mouth 2 (two) times daily., Disp: 120 tablet, Rfl: 3 .  NON FORMULARY, Glucocare, Disp: , Rfl:  .  NON FORMULARY, Bergamot, Disp: , Rfl:  .  NON FORMULARY, Glucocare, Disp: , Rfl:  .  NON FORMULARY, Gymemna, Disp: , Rfl:  .  Nutritional Supplements (SILICA PO), Take by mouth., Disp: , Rfl:  .  Omega-3 Fatty Acids (FISH OIL PO), Take by mouth daily., Disp: , Rfl:  .  Specialty Vitamins Products (ONE-A-DAY BONE STRENGTH PO), Take 1 tablet by mouth daily. , Disp: , Rfl:   EXAM:  Filed Vitals:   03/14/14 0922  BP: 130/86  Pulse: 64  Temp: 98.1 F (36.7 C)    GENERAL: vitals reviewed and listed below, alert, oriented, appears well hydrated and in no acute distress  HEENT: head atraumatic, PERRLA, normal appearance of eyes, ears, nose and mouth. moist mucus membranes.  NECK: supple, no masses or lymphadenopathy  LUNGS: clear to auscultation bilaterally, no rales, rhonchi or wheeze  CV: HRRR, no peripheral edema or cyanosis, normal pedal pulses  BREAST: normal appearance - no lesions or discharge, on palpation normal breast tissue without any suspicious masses  ABDOMEN: bowel sounds normal, soft, non tender to palpation, no masses, no rebound or guarding  GU: declined  RECTAL: refused  SKIN: no rash or abnormal lesions  MS: normal gait, moves all extremities normally  NEURO: CN II-XII grossly intact, normal muscle strength and sensation to light touch on extremities  PSYCH: normal affect, pleasant and cooperative  ASSESSMENT AND PLAN:  Discussed the following assessment and plan:  Visit for preventive health examination  Type 2 diabetes mellitus without complication - Plan: Hemoglobin A1c, Microalbumin/Creatinine Ratio, Urine  Essential hypertension, benign - Plan: Basic metabolic panel  Hyperlipemia - Plan: Lipid  Panel   -Discussed and advised all [place] preventive services health task force level A and B recommendations for age, sex and risks.  -Advised at least 150 minutes of exercise per week and a healthy diet low in saturated fats and sweets and consisting of fresh fruits and vegetables, lean meats such as fish and white chicken and whole grains.  -FASTING labs, studies and vaccines per orders this encounter  -recommendations per below - discussed significant potential risks with quality and dosing with supplements.  Orders Placed This Encounter  Procedures  . Basic metabolic panel  . Hemoglobin A1c  . Lipid Panel  .  Microalbumin/Creatinine Ratio, Urine    Patient advised to return to clinic immediately if symptoms worsen or persist or new concerns.  Patient Instructions  BEFORE YOU LEAVE: -labs -follow up in 3 months  I advise you to stop the supplements except for Vit D3 [phone] IU daily (advise [facility] brand). Eat lots of herbs, fresh or frozen fruits and vegetables and fish in your diet instead.  We recommend the following healthy lifestyle measures: - eat a healthy diet consisting of lots of vegetables, fruits, beans, nuts, seeds, healthy meats such as white chicken and fish and whole grains.  - avoid fried foods, fast food, processed foods, sodas, red meet and other fattening foods.  - get a least 150 minutes of aerobic exercise per week.    Please get you mammogram  Colonoscopy due in 2018 per gastroenterology letter  Get yearly eye exam     Return in about 3 months (around 06/14/2014) for follow up.  Colin Benton R.

## 2014-03-15 ENCOUNTER — Encounter: Payer: Self-pay | Admitting: Family Medicine

## 2014-03-16 ENCOUNTER — Telehealth: Payer: Self-pay | Admitting: *Deleted

## 2014-03-16 MED ORDER — ROSUVASTATIN CALCIUM 10 MG PO TABS
10.0000 mg | ORAL_TABLET | Freq: Every day | ORAL | Status: DC
Start: 2014-03-16 — End: 2014-07-29

## 2014-03-16 NOTE — Addendum Note (Signed)
Addended by: Agnes Lawrence on: 03/16/2014 09:48 AM   Modules accepted: Orders

## 2014-03-16 NOTE — Telephone Encounter (Signed)
I called the pt and informed her of the results. 

## 2014-04-09 ENCOUNTER — Ambulatory Visit: Admission: RE | Admit: 2014-04-09 | Payer: BC Managed Care – PPO | Source: Ambulatory Visit

## 2014-04-26 ENCOUNTER — Ambulatory Visit
Admission: RE | Admit: 2014-04-26 | Discharge: 2014-04-26 | Disposition: A | Discharge status: Home / Self Care | Payer: BC Managed Care – PPO | Source: Ambulatory Visit

## 2014-04-26 DIAGNOSIS — Z1231 Encounter for screening mammogram for malignant neoplasm of breast: Secondary | ICD-10-CM

## 2014-05-29 ENCOUNTER — Other Ambulatory Visit: Payer: Self-pay | Admitting: Family Medicine

## 2014-06-20 ENCOUNTER — Ambulatory Visit: Payer: BC Managed Care – PPO | Admitting: Family Medicine

## 2014-07-16 ENCOUNTER — Other Ambulatory Visit: Payer: Self-pay | Admitting: Family Medicine

## 2014-07-29 ENCOUNTER — Other Ambulatory Visit: Payer: Self-pay | Admitting: Family Medicine

## 2014-08-01 ENCOUNTER — Other Ambulatory Visit: Payer: Self-pay | Admitting: Family Medicine

## 2014-08-20 ENCOUNTER — Other Ambulatory Visit: Payer: Self-pay | Admitting: Family Medicine

## 2014-08-25 LAB — HM DIABETES EYE EXAM

## 2014-08-29 ENCOUNTER — Other Ambulatory Visit: Payer: Self-pay | Admitting: Family Medicine

## 2014-09-07 ENCOUNTER — Other Ambulatory Visit: Payer: Self-pay | Admitting: Family Medicine

## 2014-09-18 ENCOUNTER — Other Ambulatory Visit: Payer: Self-pay | Admitting: Family Medicine

## 2014-09-26 ENCOUNTER — Ambulatory Visit (INDEPENDENT_AMBULATORY_CARE_PROVIDER_SITE_OTHER): Payer: BC Managed Care – PPO | Admitting: Family Medicine

## 2014-09-26 ENCOUNTER — Encounter: Payer: Self-pay | Admitting: Family Medicine

## 2014-09-26 VITALS — BP 100/70 | HR 67 | Temp 98.1°F | Ht 63.0 in | Wt 132.0 lb

## 2014-09-26 DIAGNOSIS — I10 Essential (primary) hypertension: Secondary | ICD-10-CM | POA: Diagnosis not present

## 2014-09-26 DIAGNOSIS — E785 Hyperlipidemia, unspecified: Secondary | ICD-10-CM

## 2014-09-26 DIAGNOSIS — E119 Type 2 diabetes mellitus without complications: Secondary | ICD-10-CM | POA: Diagnosis not present

## 2014-09-26 DIAGNOSIS — M810 Age-related osteoporosis without current pathological fracture: Secondary | ICD-10-CM | POA: Diagnosis not present

## 2014-09-26 DIAGNOSIS — Z23 Encounter for immunization: Secondary | ICD-10-CM | POA: Diagnosis not present

## 2014-09-26 LAB — LIPID PANEL
CHOLESTEROL: 150 mg/dL (ref 0–200)
HDL: 64.8 mg/dL (ref >39.00)
LDL Cholesterol: 60 mg/dL (ref 0–99)
NonHDL: 85.17
Total CHOL/HDL Ratio: 2
Triglycerides: 125 mg/dL (ref 0.0–149.0)
VLDL: 25 mg/dL (ref 0.0–40.0)

## 2014-09-26 LAB — BASIC METABOLIC PANEL
BUN: 14 mg/dL (ref 6–23)
CALCIUM: 9.9 mg/dL (ref 8.4–10.5)
CO2: 31 meq/L (ref 19–32)
CREATININE: 0.67 mg/dL (ref 0.40–1.20)
Chloride: 102 mEq/L (ref 96–112)
GFR: 95.17 mL/min (ref >60.00)
GLUCOSE: 122 mg/dL — AB (ref 70–99)
Potassium: 4.5 mEq/L (ref 3.5–5.1)
Sodium: 140 mEq/L (ref 135–145)

## 2014-09-26 LAB — HEMOGLOBIN A1C: HEMOGLOBIN A1C: 6.8 % — AB (ref 4.6–6.5)

## 2014-09-26 MED ORDER — LOSARTAN POTASSIUM 25 MG PO TABS: 25.0000 mg | ORAL_TABLET | Freq: Every day | ORAL | Status: DC

## 2014-09-26 MED ORDER — AMLODIPINE BESYLATE 5 MG PO TABS: 5.0000 mg | ORAL_TABLET | Freq: Every day | ORAL | Status: DC

## 2014-09-26 NOTE — Progress Notes (Signed)
Pre visit review using our clinic review tool, if applicable. No additional management support is needed unless otherwise documented below in the visit note. 

## 2014-09-26 NOTE — Progress Notes (Signed)
HPI:  Diabetes Mellitus: -meds: 500mg  bid metformin XR -home BS: BS in low 100s fasting -diet and exercise: reports has increased exercise, yoga and elliptical and weights (almost daily); diet is ok - watching portions - but does eat some cake, cut out cookies and soda -last eye exam: Goes yearly -denies: foot lesions, vision changes, polyuria, polydipsia, hypglycemia  HLD: -meds: crestor advised -stable -denies: cog impairment, leg cramps  HTN: -meds: norvasc 5 mg daily -denies: CP, SOB, DOE, HA   ROS: See pertinent positives and negatives per HPI.  Past Medical History  Diagnosis Date  . DM (diabetes mellitus)   . Pure hyperglyceridemia   . HTN (hypertension)   . Allergic rhinitis   . Internal hemorrhoids   . Glaucoma     ? of, but seeing optho and told recently not glaucoma  . Osteoporosis     in 2010, has refused bisphosphnate treatment or treament other then vit d and calcium  . High cholesterol   . Panic anxiety syndrome 01/03/2013    Past Surgical History  Procedure Laterality Date  . Colonoscopy  05/03/2006    internal hemorrhoids    Family History  Problem Relation Age of Onset  . Coronary artery disease Maternal Uncle   . Lung cancer Sister   . Cancer Sister     lung   . Breast cancer Maternal Aunt   . Colon cancer Mother     deceased age 58  . Cancer Mother 48  . Hypertension Mother   . Diabetes Father   . Hypertension Father   . Diabetes Paternal Aunt     Social History   Social History  . Marital Status: Married    Spouse Name: N/A  . Number of Children: N/A  . Years of Education: N/A   Social History Main Topics  . Smoking status: Never Smoker   . Smokeless tobacco: None  . Alcohol Use: Yes     Comment: occ - very rare  . Drug Use: No  . Sexual Activity: Not Asked   Other Topics Concern  . 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 [employer] Situation: lives with husband and son - has  4 children, son in Facilities manager school, son animation, daughter is Pension scheme manager      Spiritual Beliefs: Baha'i faith - all religions are one, prayer based, no health care restrictions      Lifestyle: no CV exercise; diet is good              Current outpatient prescriptions:  .  amLODipine (NORVASC) 5 MG tablet, Take 1 tablet (5 mg total) by mouth daily., Disp: 90 tablet, Rfl: 3 .  CINNAMON PO, Take by mouth., Disp: , Rfl:  .  FREESTYLE LITE test strip, , Disp: , Rfl:  .  Lancets (FREESTYLE) lancets, , Disp: , Rfl:  .  losartan (COZAAR) 25 MG tablet, Take 1 tablet (25 mg total) by mouth daily., Disp: 90 tablet, Rfl: 3 .  metFORMIN (GLUCOPHAGE-XR) 500 MG 24 hr tablet, TAKE ONE TABLET BY MOUTH TWICE DAILY, Disp: 60 tablet, Rfl: 0 .  Omega-3 Fatty Acids (FISH OIL PO), Take by mouth daily., Disp: , Rfl:  .  rosuvastatin (CRESTOR) 10 MG tablet, TAKE ONE TABLET BY MOUTH ONCE DAILY, Disp: 30 tablet, Rfl: 0 .  Specialty Vitamins Products (ONE-A-DAY BONE STRENGTH PO), Take 1 tablet by mouth daily. , Disp: , Rfl:   EXAM:  Filed Vitals:   09/26/14 0901  BP: 100/70  Pulse: 67  Temp: 98.1 F (36.7 C)    Body mass index is 23.39 kg/(m^2).  GENERAL: vitals reviewed and listed above, alert, oriented, appears well hydrated and in no acute distress  HEENT: atraumatic, conjunttiva clear, no obvious abnormalities on inspection of external nose and ears  NECK: no obvious masses on inspection  LUNGS: clear to auscultation bilaterally, no wheezes, rales or rhonchi, good air movement  CV: HRRR, no peripheral edema  MS: moves all extremities without noticeable abnormality  PSYCH: pleasant and cooperative, no obvious depression or anxiety  ASSESSMENT AND PLAN:  Discussed the following assessment and plan:  Type 2 diabetes mellitus without complication - Plan: Hemoglobin A1c  Hyperlipemia - Plan: Lipid panel  Essential hypertension, benign - Plan: Basic metabolic panel  Osteoporosis  -FASTING  labs -flu shot -Patient advised to return or notify a doctor immediately if symptoms worsen or persist or new concerns arise.  Patient Instructions  BEFORE YOU LEAVE: -labs -flu shot -follow up in 4 months -obtain eye report for our records  We recommend the following healthy lifestyle measures: - eat a healthy diet consisting of lots of vegetables, fruits, beans, nuts, seeds, healthy meats such as white chicken and fish and whole grains.  - avoid sweets, white starches fried foods, fast food, processed foods, sodas, red meet and other fattening foods.  - get a least 150 minutes of aerobic exercise per week.       Colin Benton R.

## 2014-09-26 NOTE — Patient Instructions (Addendum)
BEFORE YOU LEAVE: -labs -flu shot -follow up in 4 months -obtain eye report for our records  We recommend the following healthy lifestyle measures: - eat a healthy diet consisting of lots of vegetables, fruits, beans, nuts, seeds, healthy meats such as white chicken and fish and whole grains.  - avoid sweets, white starches fried foods, fast food, processed foods, sodas, red meet and other fattening foods.  - get a least 150 minutes of aerobic exercise per week.

## 2014-09-28 ENCOUNTER — Other Ambulatory Visit: Payer: Self-pay | Admitting: Family Medicine

## 2014-10-08 ENCOUNTER — Encounter: Payer: Self-pay | Admitting: Family Medicine

## 2014-10-09 ENCOUNTER — Other Ambulatory Visit: Payer: Self-pay | Admitting: Family Medicine

## 2014-12-27 ENCOUNTER — Encounter: Payer: Self-pay | Admitting: Internal Medicine

## 2015-01-10 ENCOUNTER — Other Ambulatory Visit (HOSPITAL_COMMUNITY)
Admission: RE | Admit: 2015-01-10 | Discharge: 2015-01-10 | Disposition: A | Discharge status: Home / Self Care | Payer: BC Managed Care – PPO | Source: Ambulatory Visit | Attending: Family Medicine | Admitting: Family Medicine

## 2015-01-10 ENCOUNTER — Encounter: Payer: Self-pay | Admitting: Family Medicine

## 2015-01-10 ENCOUNTER — Ambulatory Visit (INDEPENDENT_AMBULATORY_CARE_PROVIDER_SITE_OTHER): Payer: BC Managed Care – PPO | Admitting: Family Medicine

## 2015-01-10 VITALS — BP 100/74 | HR 74 | Temp 97.9°F | Ht 63.0 in | Wt 130.1 lb

## 2015-01-10 DIAGNOSIS — Z1151 Encounter for screening for human papillomavirus (HPV): Secondary | ICD-10-CM | POA: Insufficient documentation

## 2015-01-10 DIAGNOSIS — Z01419 Encounter for gynecological examination (general) (routine) without abnormal findings: Secondary | ICD-10-CM | POA: Diagnosis present

## 2015-01-10 DIAGNOSIS — M25531 Pain in right wrist: Secondary | ICD-10-CM

## 2015-01-10 DIAGNOSIS — Z124 Encounter for screening for malignant neoplasm of cervix: Secondary | ICD-10-CM | POA: Diagnosis not present

## 2015-01-10 NOTE — Progress Notes (Signed)
Pre visit review using our clinic review tool, if applicable. No additional management support is needed unless otherwise documented below in the visit note. 

## 2015-01-10 NOTE — Progress Notes (Signed)
HPI:  Acute visit for:  Pap smear: -declined at physical and within normal timeframe for new screening guidelines -reports: wants to do today, no concerns, thought was due now -denies:vag discharge, hx abnormal pap  R wrist bump: -noticed bump on wrist -occ pain with witting for a long time -no swelling, redness, trauma  ROS: See pertinent positives and negatives per HPI.  Past Medical History  Diagnosis Date  . DM (diabetes mellitus) ([place])   . Pure hyperglyceridemia   . HTN (hypertension)   . Allergic rhinitis   . Internal hemorrhoids   . Glaucoma     ? of, but seeing optho and told recently not glaucoma  . Osteoporosis     in 2010, has refused bisphosphnate treatment or treament other then vit d and calcium  . High cholesterol   . Panic anxiety syndrome 01/03/2013    Past Surgical History  Procedure Laterality Date  . Colonoscopy  05/03/2006    internal hemorrhoids    Family History  Problem Relation Age of Onset  . Coronary artery disease Maternal Uncle   . Lung cancer Sister   . Cancer Sister     lung   . Breast cancer Maternal Aunt   . Colon cancer Mother     deceased age 37  . Cancer Mother 31  . Hypertension Mother   . Diabetes Father   . Hypertension Father   . Diabetes Paternal Aunt     Social History   Social History  . Marital Status: Married    Spouse Name: N/A  . Number of Children: N/A  . Years of Education: N/A   Social History Main Topics  . Smoking status: Never Smoker   . Smokeless tobacco: None  . Alcohol Use: Yes     Comment: occ - very rare  . Drug Use: No  . Sexual Activity: Not Asked   Other Topics Concern  . 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 [employer] Situation: lives with husband and son - has 4 children, son in Facilities manager school, son animation, daughter is Pension scheme manager      Spiritual Beliefs: Baha'i faith - all religions are one, prayer based, no health care restrictions      Lifestyle: no CV exercise; diet is good              Current outpatient prescriptions:  .  amLODipine (NORVASC) 5 MG tablet, Take 1 tablet (5 mg total) by mouth daily., Disp: 90 tablet, Rfl: 3 .  CINNAMON PO, Take by mouth., Disp: , Rfl:  .  FREESTYLE LITE test strip, , Disp: , Rfl:  .  Lancets (FREESTYLE) lancets, , Disp: , Rfl:  .  losartan (COZAAR) 25 MG tablet, Take 1 tablet (25 mg total) by mouth daily., Disp: 90 tablet, Rfl: 3 .  metFORMIN (GLUCOPHAGE-XR) 500 MG 24 hr tablet, TAKE ONE TABLET BY MOUTH TWICE DAILY. NEED APPOINTMENT, Disp: 60 tablet, Rfl: 3 .  Omega-3 Fatty Acids (FISH OIL PO), Take by mouth daily., Disp: , Rfl:  .  rosuvastatin (CRESTOR) 10 MG tablet, TAKE ONE TABLET BY MOUTH ONCE DAILY, Disp: 30 tablet, Rfl: 3 .  Specialty Vitamins Products (ONE-A-DAY BONE STRENGTH PO), Take 1 tablet by mouth daily. , Disp: , Rfl:   EXAM:  Filed Vitals:   01/10/15 1112  BP: 100/74  Pulse: 74  Temp: 97.9 F (36.6 C)    Body mass index is 23.05  kg/(m^2).  GENERAL: vitals reviewed and listed above, alert, oriented, appears well hydrated and in no acute distress  HEENT: atraumatic, conjunttiva clear, no obvious abnormalities on inspection of external nose and ears  NECK: no obvious masses on inspection  GU: normal exam, pap obtained  MS: moves all extremities without noticeable abnormality - normal appearance of both wrists, normal anatomy is bump she noted, no weakness, no swelling or erythema, no pain on exam  PSYCH: pleasant and cooperative, no obvious depression or anxiety  ASSESSMENT AND PLAN:  Discussed the following assessment and plan:  Cervical cancer screening -pap obtained, advised to call if no results in 1-2 weeks  Right wrist pain -none now, normal exam, opted to monitor  -Patient advised to return or notify a doctor immediately if symptoms worsen or persist or new concerns arise.  There are no Patient Instructions on file for this visit.   Victoria Benton R.

## 2015-01-14 LAB — CYTOLOGY - PAP

## 2015-01-30 ENCOUNTER — Other Ambulatory Visit: Payer: Self-pay | Admitting: Family Medicine

## 2015-02-19 ENCOUNTER — Other Ambulatory Visit: Payer: Self-pay | Admitting: Family Medicine

## 2015-02-22 ENCOUNTER — Telehealth: Payer: Self-pay | Admitting: Family Medicine

## 2015-02-22 ENCOUNTER — Ambulatory Visit (INDEPENDENT_AMBULATORY_CARE_PROVIDER_SITE_OTHER): Payer: BC Managed Care – PPO | Admitting: Family Medicine

## 2015-02-22 VITALS — BP 100/66 | HR 101 | Temp 103.0°F | Ht 63.0 in

## 2015-02-22 DIAGNOSIS — S0083XA Contusion of other part of head, initial encounter: Secondary | ICD-10-CM | POA: Diagnosis not present

## 2015-02-22 DIAGNOSIS — R509 Fever, unspecified: Secondary | ICD-10-CM

## 2015-02-22 DIAGNOSIS — J111 Influenza due to unidentified influenza virus with other respiratory manifestations: Secondary | ICD-10-CM | POA: Diagnosis not present

## 2015-02-22 LAB — POCT INFLUENZA A/B: Influenza A, POC: POSITIVE — AB

## 2015-02-22 MED ORDER — OSELTAMIVIR PHOSPHATE 75 MG PO CAPS: 75.0000 mg | ORAL_CAPSULE | Freq: Two times a day (BID) | ORAL | Status: DC

## 2015-02-22 NOTE — Progress Notes (Signed)
Pre visit review using our clinic review tool, if applicable. No additional management support is needed unless otherwise documented below in the visit note. 

## 2015-02-22 NOTE — Progress Notes (Signed)
   Subjective:    Patient ID: Victoria Neal, female    DOB: 1955-04-02, 60 y.o.   MRN: [id number]  HPI Patient seen as a work in for the following issues:  1 day history of fever. She's had some dry cough. Denies any dyspnea. No sore throat. Minimal nasal congestion. Had flu vaccine earlier this year. Denies any nausea, vomiting, or diarrhea. No dysuria. No skin rashes. No abdominal pain.  Right periorbital swelling and bruising. Yesterday she ran into a chair and immediate bruising and swelling of her right brow region. No loss of consciousness. No blurred vision. She's had some progressive swelling of her right upper lid and brow region since then. No confusion.  Past Medical History  Diagnosis Date  . DM (diabetes mellitus) ([place])   . Pure hyperglyceridemia   . HTN (hypertension)   . Allergic rhinitis   . Internal hemorrhoids   . Glaucoma     ? of, but seeing optho and told recently not glaucoma  . Osteoporosis     in 2010, has refused bisphosphnate treatment or treament other then vit d and calcium  . High cholesterol   . Panic anxiety syndrome 01/03/2013   Past Surgical History  Procedure Laterality Date  . Colonoscopy  05/03/2006    internal hemorrhoids    reports that she has never smoked. She does not have any smokeless tobacco history on file. She reports that she drinks alcohol. She reports that she does not use illicit drugs. family history includes Breast cancer in her maternal aunt; Cancer in her sister; Cancer (age of onset: 53) in her mother; Colon cancer in her mother; Coronary artery disease in her maternal uncle; Diabetes in her father and paternal aunt; Hypertension in her father and mother; Lung cancer in her sister. Allergies  Allergen Reactions  . Aspirin     REACTION: Swelling  . Lisinopril Cough  . Statins     Muscle cramps; pravastatin ok      Review of Systems  Constitutional: Positive for fever, chills and fatigue.  HENT: Positive for facial  swelling. Negative for sore throat.   Respiratory: Positive for cough.   Gastrointestinal: Negative for abdominal pain.  Genitourinary: Negative for dysuria.       Objective:   Physical Exam  Constitutional: She appears well-developed and well-nourished.  HENT:  Right Ear: External ear normal.  Left Ear: External ear normal.  Mouth/Throat: Oropharynx is clear and moist.  She has bruising noted in the right frontal region and the right eyebrow region with significant bruising and swelling of the right upper eyelid. There are no cellulitis changes. Extraocular movements are all normal. Minimal superior orbital rim tenderness  Neck: Neck supple.  Cardiovascular: Normal rate and regular rhythm.   Pulmonary/Chest: Effort normal.  Abdominal: Soft. There is no tenderness.  Lymphadenopathy:    She has no cervical adenopathy.          Assessment & Plan:  #1 contusion right face. She has some increased right superior orbital swelling related to injury yesterday. No evidence for eye injury.  Continue ice for edema control. She is aware this will likely be bruised and swollen for several days  #2 febrile illness. Rule out influenza. Rapid screen obtained. Influenza screen positive.  Start Tamiflu 75 mg po bid for 7 days.
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
## 2015-02-22 NOTE — Patient Instructions (Signed)

## 2015-02-22 NOTE — Telephone Encounter (Signed)
Pt has an appt this afternoon. 

## 2015-02-26 ENCOUNTER — Encounter: Payer: Self-pay | Admitting: Family Medicine

## 2015-02-26 ENCOUNTER — Ambulatory Visit (INDEPENDENT_AMBULATORY_CARE_PROVIDER_SITE_OTHER): Payer: BC Managed Care – PPO | Admitting: Family Medicine

## 2015-02-26 VITALS — BP 98/70 | HR 80 | Temp 98.8°F | Ht 63.0 in | Wt 128.7 lb

## 2015-02-26 DIAGNOSIS — J111 Influenza due to unidentified influenza virus with other respiratory manifestations: Secondary | ICD-10-CM | POA: Diagnosis not present

## 2015-02-26 DIAGNOSIS — S0083XD Contusion of other part of head, subsequent encounter: Secondary | ICD-10-CM

## 2015-02-26 NOTE — Progress Notes (Signed)
HPI:  Victoria Neal  is a pleasant 60 year old female here for an acute visit follow-up on a recent influenzal illness and face injury. She reports that she saw my colleague 3 days ago and was diagnosed with the flu. She feels she is improved on Tamiflu with resolution of fevers and no body aches or shortness of breath. She does have a mild persistent cough and would like me to listen to her lungs. She suffered an injury to the right frontal area when her toddler age grandson pulled a chair into her face. She has had some bruising around the right eye related to this. She reports all pain from this injury has resolved and that the swelling has decreased. She denies headaches , dizziness, vision changes orofacial pain at this time.  ROS: See pertinent positives and negatives per HPI.  Past Medical History  Diagnosis Date  . DM (diabetes mellitus) ([place])   . Pure hyperglyceridemia   . HTN (hypertension)   . Allergic rhinitis   . Internal hemorrhoids   . Glaucoma     ? of, but seeing optho and told recently not glaucoma  . Osteoporosis     in 2010, has refused bisphosphnate treatment or treament other then vit d and calcium  . High cholesterol   . Panic anxiety syndrome 01/03/2013    Past Surgical History  Procedure Laterality Date  . Colonoscopy  05/03/2006    internal hemorrhoids    Family History  Problem Relation Age of Onset  . Coronary artery disease Maternal Uncle   . Lung cancer Sister   . Cancer Sister     lung   . Breast cancer Maternal Aunt   . Colon cancer Mother     deceased age 8  . Cancer Mother 20  . Hypertension Mother   . Diabetes Father   . Hypertension Father   . Diabetes Paternal Aunt     Social History   Social History  . Marital Status: Married    Spouse Name: N/A  . Number of Children: N/A  . Years of Education: N/A   Social History Main Topics  . Smoking status: Never Smoker   . Smokeless tobacco: None  . Alcohol Use: Yes     Comment:  occ - very rare  . Drug Use: No  . Sexual Activity: Not Asked   Other Topics Concern  . 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 [employer] Situation: lives with husband and son - has 4 children, son in Facilities manager school, son animation, daughter is Pension scheme manager      Spiritual Beliefs: Baha'i faith - all religions are one, prayer based, no health care restrictions      Lifestyle: no CV exercise; diet is good              Current outpatient prescriptions:  .  amLODipine (NORVASC) 5 MG tablet, Take 1 tablet (5 mg total) by mouth daily., Disp: 90 tablet, Rfl: 3 .  CINNAMON PO, Take by mouth., Disp: , Rfl:  .  FREESTYLE LITE test strip, , Disp: , Rfl:  .  Lancets (FREESTYLE) lancets, , Disp: , Rfl:  .  losartan (COZAAR) 25 MG tablet, Take 1 tablet (25 mg total) by mouth daily., Disp: 90 tablet, Rfl: 3 .  metFORMIN (GLUCOPHAGE-XR) 500 MG 24 hr tablet, TAKE ONE TABLET BY MOUTH TWICE DAILY. NEED APPOINTMENT., Disp: 60 tablet, Rfl: 1 .  Omega-3 Fatty  Acids (FISH OIL PO), Take by mouth daily., Disp: , Rfl:  .  oseltamivir (TAMIFLU) 75 MG capsule, Take 1 capsule (75 mg total) by mouth 2 (two) times daily., Disp: 10 capsule, Rfl: 0 .  rosuvastatin (CRESTOR) 10 MG tablet, TAKE ONE TABLET BY MOUTH ONCE DAILY, Disp: 30 tablet, Rfl: 3 .  Specialty Vitamins Products (ONE-A-DAY BONE STRENGTH PO), Take 1 tablet by mouth daily. , Disp: , Rfl:   EXAM:  Filed Vitals:   02/26/15 1104  BP: 98/70  Pulse: 80  Temp: 98.8 F (37.1 C)    Body mass index is 22.8 kg/(m^2).  GENERAL: vitals reviewed and listed above, alert, oriented, appears well hydrated and in no acute distress  HEENT:  Bruising and small hematoma right supraorbital region  some bruising below the eye as well, no bony tenderness to palpation on exam,  Normal appearance the right eye,conjunttiva clear, PERRLA, EOMI, no obvious abnormalities on inspection of external nose and ears  NECK: no obvious masses  on inspection  LUNGS: clear to auscultation bilaterally, no wheezes, rales or rhonchi, good air movement  CV: HRRR, no peripheral edema  MS: moves all extremities without noticeable abnormality  PSYCH: pleasant and cooperative, no obvious depression or anxiety  ASSESSMENT AND PLAN:  Discussed the following assessment and plan:  Influenza  Facial contusion, subsequent encounter   -seems to be improving in terms of both complaints   -supportive care and return precautions discussed   -have advised her to schedule a visit with labs in 1 month to follow up on chronic medical conditions -Patient advised to return or notify a doctor immediately if symptoms worsen or persist or new concerns arise.  Patient Instructions   Before you leave:   - schedule a follow-up appointment in about 1 month , please come fasting and we will plan to do fasting lab work that day    ice for the bruising and please stop your fish oil for 2 weeks. Please seek care immediately if you're having headaches , dizziness , changes in vision or other concerns.     Colin Benton R.

## 2015-02-26 NOTE — Progress Notes (Signed)
Pre visit review using our clinic review tool, if applicable. No additional management support is needed unless otherwise documented below in the visit note. 

## 2015-02-26 NOTE — Patient Instructions (Signed)
Before you leave:   - schedule a follow-up appointment in about 1 month , please come fasting and we will plan to do fasting lab work that day    ice for the bruising and please stop your fish oil for 2 weeks. Please seek care immediately if you're having headaches , dizziness , changes in vision or other concerns.

## 2015-03-29 ENCOUNTER — Other Ambulatory Visit: Payer: Self-pay

## 2015-03-29 DIAGNOSIS — Z1231 Encounter for screening mammogram for malignant neoplasm of breast: Secondary | ICD-10-CM

## 2015-04-01 ENCOUNTER — Ambulatory Visit (INDEPENDENT_AMBULATORY_CARE_PROVIDER_SITE_OTHER): Payer: BC Managed Care – PPO | Admitting: Family Medicine

## 2015-04-01 ENCOUNTER — Encounter: Payer: Self-pay | Admitting: Family Medicine

## 2015-04-01 VITALS — BP 100/62 | HR 70 | Temp 98.2°F | Ht 63.0 in | Wt 125.7 lb

## 2015-04-01 DIAGNOSIS — M545 Low back pain, unspecified: Secondary | ICD-10-CM

## 2015-04-01 DIAGNOSIS — E785 Hyperlipidemia, unspecified: Secondary | ICD-10-CM

## 2015-04-01 DIAGNOSIS — E119 Type 2 diabetes mellitus without complications: Secondary | ICD-10-CM | POA: Diagnosis not present

## 2015-04-01 DIAGNOSIS — I10 Essential (primary) hypertension: Secondary | ICD-10-CM

## 2015-04-01 DIAGNOSIS — M810 Age-related osteoporosis without current pathological fracture: Secondary | ICD-10-CM

## 2015-04-01 LAB — BASIC METABOLIC PANEL
BUN: 14 mg/dL (ref 6–23)
CHLORIDE: 103 meq/L (ref 96–112)
CO2: 29 mEq/L (ref 19–32)
CREATININE: 0.68 mg/dL (ref 0.40–1.20)
Calcium: 9.7 mg/dL (ref 8.4–10.5)
GFR: 93.4 mL/min (ref >60.00)
Glucose, Bld: 136 mg/dL — ABNORMAL HIGH (ref 70–99)
Potassium: 4.2 mEq/L (ref 3.5–5.1)
SODIUM: 139 meq/L (ref 135–145)

## 2015-04-01 LAB — HEMOGLOBIN A1C: HEMOGLOBIN A1C: 7 % — AB (ref 4.6–6.5)

## 2015-04-01 NOTE — Patient Instructions (Signed)
BEFORE YOU LEAVE: -low back exercises -labs -schedule 4 month follow up  Do the exercises for the back at least 4 days per week. Follow up up if pain is not resolved in 4 weeks.  We recommend the following healthy lifestyle measures: - eat a healthy whole foods diet consisting of regular small meals composed of vegetables, fruits, beans, nuts, seeds, healthy meats such as white chicken and fish and whole grains.  - avoid sweets, white starchy foods, fried foods, fast food, processed foods, sodas, red meet and other fattening foods.  - get a least 150-300 minutes of aerobic exercise per week.   -We have ordered labs or studies at this visit. It can take up to 1-2 weeks for results and processing. We will contact you with instructions IF your results are abnormal. Normal results will be released to your [facility]. If you have not heard from [place] or can not find your results in [facility] in 2 weeks please contact our office.

## 2015-04-01 NOTE — Progress Notes (Signed)
HPI: Follow up:  With grandson and spouse. Happy.  Diabetes Mellitus: -meds: 500mg  bid metformin XR, cinnamon -home BS: good -diet and exercise: not as good lately as had flu and then back injuriny -last eye exam: Goes yearly -denies: foot lesions, vision changes, polyuria, polydipsia, hypglycemia  HLD: -meds: crestor advised, fish oil -stable -denies: cog impairment, leg cramps  HTN: -meds: norvasc 5 mg and losartan 25mg  daily -denies: CP, SOB, DOE, HA   New complaint of Back pain: -started about 1 week ago with carrying grandson 1.5 yo -mod pain in R low back with certain activities (lifting, pushing) better now with stretching and ice -denies: radiation, weakness, numbness, bowel or bladder incontinence, malaise  ROS: See pertinent positives and negatives per HPI.  Past Medical History  Diagnosis Date  . DM (diabetes mellitus) ([place])   . Pure hyperglyceridemia   . HTN (hypertension)   . Allergic rhinitis   . Internal hemorrhoids   . Glaucoma     ? of, but seeing optho and told recently not glaucoma  . Osteoporosis     in 2010, has refused bisphosphnate treatment or treament other then vit d and calcium  . High cholesterol   . Panic anxiety syndrome 01/03/2013    Past Surgical History  Procedure Laterality Date  . Colonoscopy  05/03/2006    internal hemorrhoids    Family History  Problem Relation Age of Onset  . Coronary artery disease Maternal Uncle   . Lung cancer Sister   . Cancer Sister     lung   . Breast cancer Maternal Aunt   . Colon cancer Mother     deceased age 52  . Cancer Mother 12  . Hypertension Mother   . Diabetes Father   . Hypertension Father   . Diabetes Paternal Aunt     Social History   Social History  . Marital Status: Married    Spouse Name: N/A  . Number of Children: N/A  . Years of Education: N/A   Social History Main Topics  . Smoking status: Never Smoker   . Smokeless tobacco: None  . Alcohol Use: Yes      Comment: occ - very rare  . Drug Use: No  . Sexual Activity: Not Asked   Other Topics Concern  . 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 [employer] Situation: lives with husband and son - has 4 children, son in Facilities manager school, son animation, daughter is Pension scheme manager      Spiritual Beliefs: Baha'i faith - all religions are one, prayer based, no health care restrictions      Lifestyle: no CV exercise; diet is good              Current outpatient prescriptions:  .  amLODipine (NORVASC) 5 MG tablet, Take 1 tablet (5 mg total) by mouth daily., Disp: 90 tablet, Rfl: 3 .  CINNAMON PO, Take by mouth., Disp: , Rfl:  .  FREESTYLE LITE test strip, , Disp: , Rfl:  .  Lancets (FREESTYLE) lancets, , Disp: , Rfl:  .  losartan (COZAAR) 25 MG tablet, Take 1 tablet (25 mg total) by mouth daily., Disp: 90 tablet, Rfl: 3 .  metFORMIN (GLUCOPHAGE-XR) 500 MG 24 hr tablet, TAKE ONE TABLET BY MOUTH TWICE DAILY. NEED APPOINTMENT., Disp: 60 tablet, Rfl: 1 .  Omega-3 Fatty Acids (FISH OIL PO), Take by mouth daily., Disp: , Rfl:  .  oseltamivir (  TAMIFLU) 75 MG capsule, Take 1 capsule (75 mg total) by mouth 2 (two) times daily., Disp: 10 capsule, Rfl: 0 .  rosuvastatin (CRESTOR) 10 MG tablet, TAKE ONE TABLET BY MOUTH ONCE DAILY, Disp: 30 tablet, Rfl: 3 .  Specialty Vitamins Products (ONE-A-DAY BONE STRENGTH PO), Take 1 tablet by mouth daily. , Disp: , Rfl:   EXAM:  Filed Vitals:   04/01/15 0806  BP: 100/62  Pulse: 70  Temp: 98.2 F (36.8 C)    Body mass index is 22.27 kg/(m^2).  GENERAL: vitals reviewed and listed above, alert, oriented, appears well hydrated and in no acute distress  HEENT: atraumatic, conjunttiva clear, no obvious abnormalities on inspection of external nose and ears  NECK: no obvious masses on inspection  LUNGS: clear to auscultation bilaterally, no wheezes, rales or rhonchi, good air movement  CV: HRRR, no peripheral edema  MS: moves all  extremities without noticeable abnormality Normal Gait Normal inspection of back, no obvious scoliosis or leg length descrepancy No bony TTP Soft tissue TTP at: R glutes lat to PSIS -/+ tests: neg trendelenburg,-facet loading, -SLRT, -CLRT, -FABER, -FADIR Normal muscle strength, sensation to light touch and DTRs in LEs bilaterally   PSYCH: pleasant and cooperative, no obvious depression or anxiety  ASSESSMENT AND PLAN:  Discussed the following assessment and plan:  Type 2 diabetes mellitus without complication, without long-term current use of insulin (HCC) - Plan: Hemoglobin A1c -foot exam done -labs -lifestyle recs  Essential hypertension, benign - Plan: Basic metabolic panel -labs -cont current medications  Hyperlipemia -cont current treatment, lifestyle recs  Osteoporosis - Plan: DG Bone Density -not taking Vit D3 as advised, repeat bone denisty -was against taking medications in the past, now agrees to bisphosphonate if not improving -weight bearing exercise and adequate ca  Right-sided low back pain without sciatica -suspect strain, improving -HEP, return precuations -f/u in 3-4 weeks if any persistent symptoms advised, sooner if needed  -Patient advised to return or notify a doctor immediately if symptoms worsen or persist or new concerns arise.  Patient Instructions  BEFORE YOU LEAVE: -low back exercises -labs -schedule 4 month follow up  Do the exercises for the back at least 4 days per week. Follow up up if pain is not resolved in 4 weeks.  We recommend the following healthy lifestyle measures: - eat a healthy whole foods diet consisting of regular small meals composed of vegetables, fruits, beans, nuts, seeds, healthy meats such as white chicken and fish and whole grains.  - avoid sweets, white starchy foods, fried foods, fast food, processed foods, sodas, red meet and other fattening foods.  - get a least 150-300 minutes of aerobic exercise per week.    -We have ordered labs or studies at this visit. It can take up to 1-2 weeks for results and processing. We will contact you with instructions IF your results are abnormal. Normal results will be released to your [facility]. If you have not heard from [place] or can not find your results in [facility] in 2 weeks please contact our office.            Colin Benton R.

## 2015-04-01 NOTE — Progress Notes (Signed)
Pre visit review using our clinic review tool, if applicable. No additional management support is needed unless otherwise documented below in the visit note. 

## 2015-04-02 MED ORDER — METFORMIN HCL 1000 MG PO TABS: 1000.0000 mg | ORAL_TABLET | Freq: Two times a day (BID) | ORAL | Status: DC

## 2015-04-02 NOTE — Addendum Note (Signed)
Addended by: Agnes Lawrence on: 04/02/2015 12:03 PM   Modules accepted: Orders, Medications

## 2015-04-29 ENCOUNTER — Ambulatory Visit
Admission: RE | Admit: 2015-04-29 | Discharge: 2015-04-29 | Disposition: A | Discharge status: Home / Self Care | Payer: BC Managed Care – PPO | Source: Ambulatory Visit

## 2015-04-29 DIAGNOSIS — Z1231 Encounter for screening mammogram for malignant neoplasm of breast: Secondary | ICD-10-CM

## 2015-06-02 ENCOUNTER — Other Ambulatory Visit: Payer: Self-pay | Admitting: Family Medicine

## 2015-06-08 ENCOUNTER — Ambulatory Visit (INDEPENDENT_AMBULATORY_CARE_PROVIDER_SITE_OTHER): Payer: BC Managed Care – PPO | Admitting: Family Medicine

## 2015-06-08 VITALS — BP 122/72 | HR 77 | Temp 98.2°F | Resp 16 | Ht 63.0 in | Wt 127.4 lb

## 2015-06-08 DIAGNOSIS — N3001 Acute cystitis with hematuria: Secondary | ICD-10-CM | POA: Diagnosis not present

## 2015-06-08 DIAGNOSIS — E119 Type 2 diabetes mellitus without complications: Secondary | ICD-10-CM | POA: Diagnosis not present

## 2015-06-08 DIAGNOSIS — R300 Dysuria: Secondary | ICD-10-CM | POA: Diagnosis not present

## 2015-06-08 LAB — POCT URINALYSIS DIP (MANUAL ENTRY)
Bilirubin, UA: NEGATIVE
GLUCOSE UA: NEGATIVE
Ketones, POC UA: NEGATIVE
Nitrite, UA: NEGATIVE
Protein Ur, POC: 100 — AB
SPEC GRAV UA: 1.01
Urobilinogen, UA: 0.2
pH, UA: 5

## 2015-06-08 LAB — POC MICROSCOPIC URINALYSIS (UMFC): MUCUS RE: ABSENT

## 2015-06-08 MED ORDER — CEPHALEXIN 500 MG PO CAPS: 500.0000 mg | ORAL_CAPSULE | Freq: Three times a day (TID) | ORAL | Status: DC

## 2015-06-08 MED ORDER — PHENAZOPYRIDINE HCL 100 MG PO TABS
200.0000 mg | ORAL_TABLET | Freq: Once | ORAL | Status: AC
  Administered 2015-06-08: 200 mg via ORAL

## 2015-06-08 MED ORDER — PHENAZOPYRIDINE HCL 200 MG PO TABS: 200.0000 mg | ORAL_TABLET | Freq: Three times a day (TID) | ORAL | Status: DC | PRN

## 2015-06-08 NOTE — Progress Notes (Signed)
Subjective:  By signing my name below, I, Moises Blood, attest that this documentation has been prepared under the direction and in the presence of Delman Cheadle, MD. Electronically Signed: Moises Blood, [place]. 06/08/2015 , 12:52 PM .  Patient was seen in Room 14 .   Patient ID: Victoria Neal, female    DOB: Apr 20, 1955, 60 y.o.   MRN: [id number] Chief Complaint  Patient presents with  . Hematuria    x today  . Dysuria    x today   HPI Victoria Neal is a 60 y.o. female who presents to [facility] complaining of hematuria and dysuria with pelvic pain starting today. Patient states she's had UTI in the past but not in a long time. She "felt" it coming yesterday when she was going down stairs. She denies taking any medication for this as she came here first. She denies taking any antibiotics recently. She denies any kidney problems. She denies history of kidney stones. She denies fever, chills, nausea, vomiting, back pain, vaginal bleeding or urinary incontinence.   She believes her sugar may has been running a little higher because she "has a sweet tooth." She's been making fruit smoothies with apple, banana and pineapple and also intaking occasional dark chocolate. She also changed her rice to the diabetic rice from the [place] store. She's been cooking with the air fryer, improving her diet. She denies drinking any sodas.   Past Medical History  Diagnosis Date  . DM (diabetes mellitus) ([place])   . Pure hyperglyceridemia   . HTN (hypertension)   . Allergic rhinitis   . Internal hemorrhoids   . Glaucoma     ? of, but seeing optho and told recently not glaucoma  . Osteoporosis     in 2010, has refused bisphosphnate treatment or treament other then vit d and calcium  . High cholesterol   . Panic anxiety syndrome 01/03/2013   Prior to Admission medications   Medication Sig Start Date End Date Taking? Authorizing Provider  amLODipine (NORVASC) 5 MG tablet Take 1 tablet (5 mg total) by mouth  daily. 09/26/14  Yes Lucretia Kern, DO  FREESTYLE LITE test strip  12/09/12  Yes Historical Provider, MD  Lancets (FREESTYLE) lancets  12/09/12  Yes Historical Provider, MD  losartan (COZAAR) 25 MG tablet Take 1 tablet (25 mg total) by mouth daily. 09/26/14  Yes Lucretia Kern, DO  metFORMIN (GLUCOPHAGE) 1000 MG tablet Take 1 tablet (1,000 mg total) by mouth 2 (two) times daily with a meal. 04/02/15  Yes Lucretia Kern, DO  rosuvastatin (CRESTOR) 10 MG tablet TAKE ONE TABLET BY MOUTH ONCE DAILY 06/04/15  Yes Lucretia Kern, DO  Specialty Vitamins Products (ONE-A-DAY BONE STRENGTH PO) Take 1 tablet by mouth daily.    Yes Historical Provider, MD  CINNAMON PO Take by mouth. Reported on 06/08/2015    Historical Provider, MD  Omega-3 Fatty Acids (FISH OIL PO) Take by mouth daily. Reported on 06/08/2015    Historical Provider, MD   Allergies  Allergen Reactions  . Aspirin     REACTION: Swelling  . Lisinopril Cough  . Statins     Muscle cramps; pravastatin ok    Review of Systems  Constitutional: Negative for fever, chills and fatigue.  Gastrointestinal: Negative for nausea, vomiting, abdominal pain and diarrhea.  Genitourinary: Positive for dysuria, hematuria and pelvic pain. Negative for flank pain and vaginal bleeding.  Musculoskeletal: Negative for back pain.      Objective:   Physical Exam  Constitutional: She is oriented to person, place, and time. She appears well-developed and well-nourished. No distress.  HENT:  Head: Normocephalic and atraumatic.  Eyes: EOM are normal. Pupils are equal, round, and reactive to light.  Neck: Neck supple.  Cardiovascular: Normal rate, regular rhythm, S1 normal, S2 normal and normal heart sounds.   No murmur heard. Pulmonary/Chest: Effort normal and breath sounds normal. No respiratory distress.  Abdominal: Bowel sounds are normal. There is no tenderness. There is no CVA tenderness.  Musculoskeletal: Normal range of motion.  Neurological: She is alert and  oriented to person, place, and time.  Skin: Skin is warm and dry.  Psychiatric: She has a normal mood and affect. Her behavior is normal.  Nursing note and vitals reviewed.   BP 122/72 mmHg  Pulse 77  Temp(Src) 98.2 F (36.8 C) (Oral)  Resp 16  Ht 5\' 3"  (1.6 m)  Wt 127 lb 6.4 oz (57.788 kg)  BMI 22.57 kg/m2  SpO2 99%   Results for orders placed or performed in visit on 06/08/15  POCT Microscopic Urinalysis ([facility])  Result Value Ref Range   WBC,UR,HPF,POC Too numerous to count  (A) None WBC/hpf   RBC,UR,HPF,POC Too numerous to count  (A) None RBC/hpf   Bacteria Few (A) None, Too numerous to count   Mucus Absent Absent   Epithelial Cells, UR Per Microscopy Few (A) None, Too numerous to count cells/hpf  POCT urinalysis dipstick  Result Value Ref Range   Color, UA yellow yellow   Clarity, UA cloudy (A) clear   Glucose, UA negative negative   Bilirubin, UA negative negative   Ketones, POC UA negative negative   Spec Grav, UA 1.010    Blood, UA large (A) negative   pH, UA 5.0    Protein Ur, POC =100 (A) negative   Urobilinogen, UA 0.2    Nitrite, UA Negative Negative   Leukocytes, UA moderate (2+) (A) Negative      Assessment & Plan:   1. Dysuria   2. Acute cystitis with hematuria    3.    Pre-DM - borderline with type II DM at hgba1c of 6.4.  Watch diet, increase exercise.  See AVS  Orders Placed This Encounter  Procedures  . Urine culture  . POCT Microscopic Urinalysis ([facility])  . POCT urinalysis dipstick    Meds ordered this encounter  Medications  . cephALEXin (KEFLEX) 500 MG capsule    Sig: Take 1 capsule (500 mg total) by mouth 3 (three) times daily.    Dispense:  21 capsule    Refill:  0  . phenazopyridine (PYRIDIUM) tablet 200 mg    Sig:   . phenazopyridine (PYRIDIUM) 200 MG tablet    Sig: Take 1 tablet (200 mg total) by mouth 3 (three) times daily as needed for pain.    Dispense:  10 tablet    Refill:  0    I personally performed the services  described in this documentation, which was scribed in my presence. The recorded information has been reviewed and considered, and addended by me as needed.   Delman Cheadle, M.D.  [facility] [address] [place], [place] [address] [phone] phone [phone] fax  06/17/2015 10:42 PM

## 2015-06-08 NOTE — Patient Instructions (Addendum)
IF you received an x-ray today, you will receive an invoice from [facility]. Please contact [facility] at [phone] with questions or concerns regarding your invoice.   IF you received labwork today, you will receive an invoice from [employer]. Please contact [facility] at [phone] with questions or concerns regarding your invoice.   Our billing staff will not be able to assist you with questions regarding bills from these companies.  You will be contacted with the lab results as soon as they are available. The fastest way to get your results is to activate your My Chart account. Instructions are located on the last page of this paperwork. If you have not heard from [place] regarding the results in 2 weeks, please contact this office.     You have pre-diabetes and are VERY close to developing diabetes.  Your hemoglobin a1c (an average of all of your blood sugars over the past 3 months) is 6.4 and 6.5 or >  is diabetes.Try to increase exercise and decrease carbohydrates in your diet - especially trying to eliminate white starches such as potatoes (sweet potatoes are ok), rice, bread, and pasta.  Eat less sugar.  Use whole grain or wheat products whenever available and try to reduce all carbohydrates to <25% of your diet.  Whenever you eat fruit, make sure it is the whole fruit (like in a smoothie) so you get fiber of the fruit (unlike in a juice where you just get the sugar of the fruit). When you eat fruit, try to choose more low glycemic index fruits (cherries, grapefruit, apricots, pears, apples, oranges, plums, strawberries, peaches, and grapes are some of the better ones for blood sugar.)   Urinary Tract Infection Urinary tract infections (UTIs) can develop anywhere along your urinary tract. Your urinary tract is your body's drainage system for removing wastes and extra water. Your urinary tract includes two kidneys, two ureters, a bladder, and  a urethra. Your kidneys are a pair of bean-shaped organs. Each kidney is about the size of your fist. They are located below your ribs, one on each side of your spine. CAUSES Infections are caused by microbes, which are microscopic organisms, including fungi, viruses, and bacteria. These organisms are so small that they can only be seen through a microscope. Bacteria are the microbes that most commonly cause UTIs. SYMPTOMS  Symptoms of UTIs may vary by age and gender of the patient and by the location of the infection. Symptoms in young women typically include a frequent and intense urge to urinate and a painful, burning feeling in the bladder or urethra during urination. Older women and men are more likely to be tired, shaky, and weak and have muscle aches and abdominal pain. A fever may mean the infection is in your kidneys. Other symptoms of a kidney infection include pain in your back or sides below the ribs, nausea, and vomiting. DIAGNOSIS To diagnose a UTI, your caregiver will ask you about your symptoms. Your caregiver will also ask you to provide a urine sample. The urine sample will be tested for bacteria and white blood cells. White blood cells are made by your body to help fight infection. TREATMENT  Typically, UTIs can be treated with medication. Because most UTIs are caused by a bacterial infection, they usually can be treated with the use of antibiotics. The choice of antibiotic and length of treatment depend on your symptoms and the type of bacteria causing your infection. HOME CARE INSTRUCTIONS  If you were prescribed antibiotics, take them exactly as your caregiver instructs you. Finish the medication even if you feel better after you have only taken some of the medication.  Drink enough water and fluids to keep your urine clear or pale yellow.  Avoid caffeine, tea, and carbonated beverages. They tend to irritate your bladder.  Empty your bladder often. Avoid holding urine for long  periods of time.  Empty your bladder before and after sexual intercourse.  After a bowel movement, women should cleanse from front to back. Use each tissue only once. SEEK MEDICAL CARE IF:   You have back pain.  You develop a fever.  Your symptoms do not begin to resolve within 3 days. SEEK IMMEDIATE MEDICAL CARE IF:   You have severe back pain or lower abdominal pain.  You develop chills.  You have nausea or vomiting.  You have continued burning or discomfort with urination. MAKE SURE YOU:   Understand these instructions.  Will watch your condition.  Will get help right away if you are not doing well or get worse.   This information is not intended to replace advice given to you by your health care provider. Make sure you discuss any questions you have with your health care provider.   Document Released: 10/01/2004 Document Revised: 09/12/2014 Document Reviewed: 01/30/2011 Elsevier Interactive Patient Education [employer].

## 2015-06-09 LAB — URINE CULTURE: Colony Count: 2000

## 2015-08-19 ENCOUNTER — Ambulatory Visit (INDEPENDENT_AMBULATORY_CARE_PROVIDER_SITE_OTHER): Payer: BC Managed Care – PPO | Admitting: Family Medicine

## 2015-08-19 ENCOUNTER — Ambulatory Visit (INDEPENDENT_AMBULATORY_CARE_PROVIDER_SITE_OTHER)
Admission: RE | Admit: 2015-08-19 | Discharge: 2015-08-19 | Disposition: A | Discharge status: Home / Self Care | Payer: BC Managed Care – PPO | Source: Ambulatory Visit | Attending: Family Medicine | Admitting: Family Medicine

## 2015-08-19 VITALS — BP 100/58 | HR 89 | Temp 99.0°F | Ht 63.0 in | Wt 126.4 lb

## 2015-08-19 DIAGNOSIS — R05 Cough: Secondary | ICD-10-CM

## 2015-08-19 DIAGNOSIS — R059 Cough, unspecified: Secondary | ICD-10-CM

## 2015-08-19 DIAGNOSIS — J069 Acute upper respiratory infection, unspecified: Secondary | ICD-10-CM | POA: Diagnosis not present

## 2015-08-19 DIAGNOSIS — M545 Low back pain: Secondary | ICD-10-CM | POA: Diagnosis not present

## 2015-08-19 LAB — POCT URINALYSIS DIPSTICK
Bilirubin, UA: NEGATIVE
Blood, UA: NEGATIVE
Glucose, UA: NEGATIVE
Ketones, UA: NEGATIVE
Leukocytes, UA: NEGATIVE
NITRITE UA: NEGATIVE
PH UA: 6
Protein, UA: NEGATIVE
Spec Grav, UA: >=1.03
Urobilinogen, UA: 0.2

## 2015-08-19 NOTE — Progress Notes (Signed)
Pre visit review using our clinic review tool, if applicable. No additional management support is needed unless otherwise documented below in the visit note. 

## 2015-08-19 NOTE — Progress Notes (Signed)
HPI:  Acute visit for:  Cough/Fever -started: 3 days ago -symptoms:nasal congestion, sore throat, cough, low grade fever - high of 100.5 at home -denies:thick sputum, SOB, NVD, tooth pain, dysuria -has tried: tylenol a few days ago -sick contacts/travel/risks: no reported flu, strep or tick exposure  Low back pain: -intermittently for > 1 year, flares with walking sometimes -no radiation of pain, loss of muscle strength, numbness or bowel or bladder incont -normal bm today -currently having some bilat lower back muscle pain, moderate   ROS: See pertinent positives and negatives per HPI.  Past Medical History:  Diagnosis Date  . Allergic rhinitis   . DM (diabetes mellitus) ([place])   . Glaucoma    ? of, but seeing optho and told recently not glaucoma  . High cholesterol   . HTN (hypertension)   . Internal hemorrhoids   . Osteoporosis    in 2010, has refused bisphosphnate treatment or treament other then vit d and calcium  . Panic anxiety syndrome 01/03/2013  . Pure hyperglyceridemia     Past Surgical History:  Procedure Laterality Date  . COLONOSCOPY  05/03/2006   internal hemorrhoids    Family History  Problem Relation Age of Onset  . Coronary artery disease Maternal Uncle   . Lung cancer Sister   . Cancer Sister     lung   . Breast cancer Maternal Aunt   . Colon cancer Mother     deceased age 94  . Cancer Mother 24  . Hypertension Mother   . Diabetes Father   . Hypertension Father   . Diabetes Paternal Aunt     Social History   Social History  . Marital status: Married    Spouse name: N/A  . Number of children: N/A  . Years of education: N/A   Social History Main Topics  . Smoking status: Never Smoker  . Smokeless tobacco: Not on file  . Alcohol use Yes     Comment: occ - very rare  . Drug use: No  . Sexual activity: Not on file   Other Topics Concern  . Not on file   Social History Narrative   Work or School: 2nd grade at [employer] Situation: lives with husband and son - has 4 children, son in Facilities manager school, son animation, daughter is Pension scheme manager      Spiritual Beliefs: Baha'i faith - all religions are one, prayer based, no health care restrictions      Lifestyle: no CV exercise; diet is good              Current Outpatient Prescriptions:  .  amLODipine (NORVASC) 5 MG tablet, Take 1 tablet (5 mg total) by mouth daily., Disp: 90 tablet, Rfl: 3 .  CINNAMON PO, Take by mouth. Reported on 06/08/2015, Disp: , Rfl:  .  FREESTYLE LITE test strip, , Disp: , Rfl:  .  Lancets (FREESTYLE) lancets, , Disp: , Rfl:  .  losartan (COZAAR) 25 MG tablet, Take 1 tablet (25 mg total) by mouth daily., Disp: 90 tablet, Rfl: 3 .  metFORMIN (GLUCOPHAGE) 1000 MG tablet, Take 1 tablet (1,000 mg total) by mouth 2 (two) times daily with a meal., Disp: 60 tablet, Rfl: 3 .  Omega-3 Fatty Acids (FISH OIL PO), Take by mouth daily. Reported on 06/08/2015, Disp: , Rfl:  .  phenazopyridine (PYRIDIUM) 200 MG tablet, Take 1 tablet (200 mg total) by mouth 3 (three) times daily as needed for  pain., Disp: 10 tablet, Rfl: 0 .  rosuvastatin (CRESTOR) 10 MG tablet, TAKE ONE TABLET BY MOUTH ONCE DAILY, Disp: 30 tablet, Rfl: 3 .  Specialty Vitamins Products (ONE-A-DAY BONE STRENGTH PO), Take 1 tablet by mouth daily. , Disp: , Rfl:   EXAM:  Vitals:   08/19/15 1617  BP: (!) 100/58  Pulse: 89  Temp: 99 F (37.2 C)    Body mass index is 22.39 kg/m.  GENERAL: vitals reviewed and listed above, alert, oriented, appears well hydrated and in no acute distress  HEENT: atraumatic, conjunttiva clear, no obvious abnormalities on inspection of external nose and ears, normal appearance of ear canals and TMs, clear nasal congestion, mild post oropharyngeal erythema with PND, no tonsillar edema or exudate, no sinus TTP  NECK: no obvious masses on inspection  LUNGS: clear to auscultation bilaterally, no wheezes, rales or rhonchi, good air movement  except for ? rhoncherous sound R upper lung field  CV: HRRR, no peripheral edema  MS: moves all extremities without noticeable abnormality, mild QL TTP on exam, no bony TTP, normal gait and function of LE with climbing on exam table  PSYCH: pleasant and cooperative, no obvious depression or anxiety  ASSESSMENT AND PLAN:  Discussed the following assessment and plan:  Acute upper respiratory infection  Cough - Plan: DG Chest 2 View  Bilateral low back pain, with sciatica presence unspecified  -given HPI and exam findings today, a serious infection or illness is unlikely. We discussed potential etiologies, with VURI being most likely, and advised supportive care and monitoring. We discussed treatment side effects, likely course, antibiotic misuse, transmission, and signs of developing a serious illness. Will get cxr to exclude LRI and will get udip given low back pain and reported fever. -follow up 2-4 weeks for chronic back issues and regular follow up -of course, we advised to return or notify a doctor immediately if symptoms worsen or persist or new concerns arise.    Patient Instructions  BEFORE YOU LEAVE: -follow up: 2-4 weeks -xray sheet -urine dip and reflex if positive  We have ordered labs or studies at this visit. It can take up to 1-2 weeks for results and processing. IF results require follow up or explanation, we will call you with instructions. Clinically stable results will be released to your [facility]. If you have not heard from [place] or cannot find your results in [facility] in 2 weeks please contact our office at [phone].  If you are not yet signed up for [facility], please consider signing up.   INSTRUCTIONS FOR UPPER RESPIRATORY INFECTION:  -plenty of rest and fluids  -nasal saline wash 2-3 times daily (use prepackaged nasal saline or bottled/distilled water if making your own)   -can use AFRIN nasal spray for drainage and nasal congestion - but do NOT use longer  then 3-4 days  -can use tylenol (in no history of liver disease) or ibuprofen (if no history of kidney disease, bowel bleeding or significant heart disease) as directed for aches and sorethroat  -if you are taking a cough medication - use only as directed, may also try a teaspoon of honey to coat the throat and throat lozenges.   -for sore throat, salt water gargles can help  -follow up if you have fevers, facial pain, tooth pain, difficulty breathing or are worsening or symptoms persist longer then expected  Upper Respiratory Infection, Adult An upper respiratory infection (URI) is also known as the common cold. It is often caused by a type  of germ (virus). Colds are easily spread (contagious). You can pass it to others by kissing, coughing, sneezing, or drinking out of the same glass. Usually, you get better in 1 to 3  weeks.  However, the cough can last for even longer. HOME CARE   Only take medicine as told by your doctor. Follow instructions provided above.  Drink enough water and fluids to keep your pee (urine) clear or pale yellow.  Get plenty of rest.  Return to work when your temperature is < 100 for 24 hours or as told by your doctor. You may use a face mask and wash your hands to stop your cold from spreading. GET HELP RIGHT AWAY IF:   After the first few days, you feel you are getting worse.  You have questions about your medicine.  You have chills, shortness of breath, or red spit (mucus).  You have pain in the face for more then 1-2 days, especially when you bend forward.  You have a fever, puffy (swollen) neck, pain when you swallow, or white spots in the back of your throat.  You have a bad headache, ear pain, sinus pain, or chest pain.  You have a high-pitched whistling sound when you breathe in and out (wheezing).  You cough up blood.  You have sore muscles or a stiff neck. MAKE SURE YOU:   Understand these instructions.  Will watch your  condition.  Will get help right away if you are not doing well or get worse. Document Released: 06/10/2007 Document Revised: 03/16/2011 Document Reviewed: 03/29/2013 [facility] Patient Information 2015 [place], [place]. This information is not intended to replace advice given to you by your health care provider. Make sure you discuss any questions you have with your health care provider.            Colin Benton R., DO

## 2015-08-19 NOTE — Addendum Note (Signed)
Addended by: Agnes Lawrence on: 08/19/2015 04:48 PM   Modules accepted: Orders

## 2015-08-19 NOTE — Patient Instructions (Signed)
BEFORE YOU LEAVE: -follow up: 2-4 weeks -xray sheet -urine dip and reflex if positive  We have ordered labs or studies at this visit. It can take up to 1-2 weeks for results and processing. IF results require follow up or explanation, we will call you with instructions. Clinically stable results will be released to your [facility]. If you have not heard from [place] or cannot find your results in [facility] in 2 weeks please contact our office at [phone].  If you are not yet signed up for [facility], please consider signing up.   INSTRUCTIONS FOR UPPER RESPIRATORY INFECTION:  -plenty of rest and fluids  -nasal saline wash 2-3 times daily (use prepackaged nasal saline or bottled/distilled water if making your own)   -can use AFRIN nasal spray for drainage and nasal congestion - but do NOT use longer then 3-4 days  -can use tylenol (in no history of liver disease) or ibuprofen (if no history of kidney disease, bowel bleeding or significant heart disease) as directed for aches and sorethroat  -if you are taking a cough medication - use only as directed, may also try a teaspoon of honey to coat the throat and throat lozenges.   -for sore throat, salt water gargles can help  -follow up if you have fevers, facial pain, tooth pain, difficulty breathing or are worsening or symptoms persist longer then expected  Upper Respiratory Infection, Adult An upper respiratory infection (URI) is also known as the common cold. It is often caused by a type of germ (virus). Colds are easily spread (contagious). You can pass it to others by kissing, coughing, sneezing, or drinking out of the same glass. Usually, you get better in 1 to 3  weeks.  However, the cough can last for even longer. HOME CARE   Only take medicine as told by your doctor. Follow instructions provided above.  Drink enough water and fluids to keep your pee (urine) clear or pale yellow.  Get plenty of rest.  Return to work when your temperature  is < 100 for 24 hours or as told by your doctor. You may use a face mask and wash your hands to stop your cold from spreading. GET HELP RIGHT AWAY IF:   After the first few days, you feel you are getting worse.  You have questions about your medicine.  You have chills, shortness of breath, or red spit (mucus).  You have pain in the face for more then 1-2 days, especially when you bend forward.  You have a fever, puffy (swollen) neck, pain when you swallow, or white spots in the back of your throat.  You have a bad headache, ear pain, sinus pain, or chest pain.  You have a high-pitched whistling sound when you breathe in and out (wheezing).  You cough up blood.  You have sore muscles or a stiff neck. MAKE SURE YOU:   Understand these instructions.  Will watch your condition.  Will get help right away if you are not doing well or get worse. Document Released: 06/10/2007 Document Revised: 03/16/2011 Document Reviewed: 03/29/2013 [facility] Patient Information 2015 [place], [place]. This information is not intended to replace advice given to you by your health care provider. Make sure you discuss any questions you have with your health care provider.

## 2015-08-26 ENCOUNTER — Ambulatory Visit (INDEPENDENT_AMBULATORY_CARE_PROVIDER_SITE_OTHER): Payer: BC Managed Care – PPO | Admitting: Family Medicine

## 2015-08-26 ENCOUNTER — Encounter: Payer: Self-pay | Admitting: Family Medicine

## 2015-08-26 VITALS — BP 92/62 | HR 78 | Temp 98.5°F | Ht 63.0 in | Wt 122.4 lb

## 2015-08-26 DIAGNOSIS — K121 Other forms of stomatitis: Secondary | ICD-10-CM | POA: Diagnosis not present

## 2015-08-26 DIAGNOSIS — B349 Viral infection, unspecified: Secondary | ICD-10-CM | POA: Diagnosis not present

## 2015-08-26 NOTE — Patient Instructions (Signed)
See the dentist as planned.  1/2 hydrogen peroxide with water - swish and spit twice daily.  Use the dental paste per instructions.  Follow up with ENT if oral lesions persist   Follow up as needed

## 2015-08-26 NOTE — Progress Notes (Signed)
Pre visit review using our clinic review tool, if applicable. No additional management support is needed unless otherwise documented below in the visit note. 

## 2015-08-26 NOTE — Progress Notes (Signed)
HPI:  Acute visit for:  Sore mouth: -had viral symptoms last week -reports developed very sore mouth and throat on the L side and husband saw swelling in her upper L mouth and seen in [facility] 5 days ago and started amox - doing better now -sees dentist tomorrow -denies fevers in the last 3-4 days, dysphagia, persistent or worsening swelling or pain  ROS: See pertinent positives and negatives per HPI.  Past Medical History:  Diagnosis Date  . Allergic rhinitis   . DM (diabetes mellitus) ([place])   . Glaucoma    ? of, but seeing optho and told recently not glaucoma  . High cholesterol   . HTN (hypertension)   . Internal hemorrhoids   . Osteoporosis    in 2010, has refused bisphosphnate treatment or treament other then vit d and calcium  . Panic anxiety syndrome 01/03/2013  . Pure hyperglyceridemia     Past Surgical History:  Procedure Laterality Date  . COLONOSCOPY  05/03/2006   internal hemorrhoids    Family History  Problem Relation Age of Onset  . Coronary artery disease Maternal Uncle   . Lung cancer Sister   . Cancer Sister     lung   . Breast cancer Maternal Aunt   . Colon cancer Mother     deceased age 80  . Cancer Mother 65  . Hypertension Mother   . Diabetes Father   . Hypertension Father   . Diabetes Paternal Aunt     Social History   Social History  . Marital status: Married    Spouse name: N/A  . Number of children: N/A  . Years of education: N/A   Social History Main Topics  . Smoking status: Never Smoker  . Smokeless tobacco: None  . Alcohol use Yes     Comment: occ - very rare  . Drug use: No  . Sexual activity: Not Asked   Other Topics Concern  . 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 [employer] Situation: lives with husband and son - has 4 children, son in Facilities manager school, son animation, daughter is Pension scheme manager      Spiritual Beliefs: Baha'i faith - all religions are one, prayer based, no health care  restrictions      Lifestyle: no CV exercise; diet is good              Current Outpatient Prescriptions:  .  amLODipine (NORVASC) 5 MG tablet, Take 1 tablet (5 mg total) by mouth daily., Disp: 90 tablet, Rfl: 3 .  amoxicillin-clavulanate (AUGMENTIN) 400-57 MG/5ML suspension, , Disp: , Rfl:  .  CINNAMON PO, Take by mouth. Reported on 06/08/2015, Disp: , Rfl:  .  FREESTYLE LITE test strip, , Disp: , Rfl:  .  Lancets (FREESTYLE) lancets, , Disp: , Rfl:  .  losartan (COZAAR) 25 MG tablet, Take 1 tablet (25 mg total) by mouth daily., Disp: 90 tablet, Rfl: 3 .  metFORMIN (GLUCOPHAGE) 1000 MG tablet, Take 1 tablet (1,000 mg total) by mouth 2 (two) times daily with a meal., Disp: 60 tablet, Rfl: 3 .  Omega-3 Fatty Acids (FISH OIL PO), Take by mouth daily. Reported on 06/08/2015, Disp: , Rfl:  .  phenazopyridine (PYRIDIUM) 200 MG tablet, Take 1 tablet (200 mg total) by mouth 3 (three) times daily as needed for pain., Disp: 10 tablet, Rfl: 0 .  rosuvastatin (CRESTOR) 10 MG tablet, TAKE ONE TABLET BY MOUTH  ONCE DAILY, Disp: 30 tablet, Rfl: 3 .  Specialty Vitamins Products (ONE-A-DAY BONE STRENGTH PO), Take 1 tablet by mouth daily. , Disp: , Rfl:   EXAM:  Vitals:   08/26/15 1106  BP: 92/62  Pulse: 78  Temp: 98.5 F (36.9 C)    Body mass index is 21.68 kg/m.  GENERAL: vitals reviewed and listed above, alert, oriented, appears well hydrated and in no acute distress  HEENT: atraumatic, conjunttiva clear, no obvious abnormalities on inspection of external nose and ears; apthous ulcer L upper mouth, L cheek and R tongue  NECK: no obvious masses on inspection  LUNGS: clear to auscultation bilaterally, no wheezes, rales or rhonchi, good air movement  CV: HRRR, no peripheral edema  MS: moves all extremities without noticeable abnormality  PSYCH: pleasant and cooperative, no obvious depression or anxiety  ASSESSMENT AND PLAN:  Discussed the following assessment and plan:  Viral  infection  Mouth ulcer  -suspect all part of viral illness with mouth ulcers, query HFM -she is now improving, seeing dentist tomorrow as has poor dentition -oral paste (rx provided for kenalog paste) and peroxide swish advised for the ulcers -advised ENT eval if lesions persist -Patient advised to return or notify a doctor immediately if symptoms worsen or persist or new concerns arise.  Patient Instructions  See the dentist as planned.  1/2 hydrogen peroxide with water - swish and spit twice daily.  Use the dental paste per instructions.  Follow up with ENT if oral lesions persist   Follow up as needed   Colin Benton R., DO

## 2015-08-27 ENCOUNTER — Ambulatory Visit
Admission: RE | Admit: 2015-08-27 | Discharge: 2015-08-27 | Disposition: A | Discharge status: Home / Self Care | Payer: BC Managed Care – PPO | Source: Ambulatory Visit | Attending: Family Medicine | Admitting: Family Medicine

## 2015-08-27 ENCOUNTER — Encounter: Payer: Self-pay | Admitting: Family Medicine

## 2015-08-27 DIAGNOSIS — M8580 Other specified disorders of bone density and structure, unspecified site: Secondary | ICD-10-CM | POA: Insufficient documentation

## 2015-08-27 DIAGNOSIS — M810 Age-related osteoporosis without current pathological fracture: Secondary | ICD-10-CM

## 2015-09-05 ENCOUNTER — Other Ambulatory Visit: Payer: Self-pay | Admitting: Family Medicine

## 2015-10-02 NOTE — Progress Notes (Signed)
HPI:  Follow up:  Diabetes Mellitus: -meds: 500mg  bid metformin XR, cinnamon -home BS: good -diet and exercise: doing much better - going to the club almost daily, working on diet -last eye exam: Goes yearly -denies: foot lesions, vision changes, polyuria, polydipsia, hypglycemia  HLD: -meds: crestor advised, fish oil -stable -denies: cog impairment, leg cramps  HTN: -meds: norvasc 5 mg and losartan 25mg  daily -denies: CP, SOB, DOE, HA  R breast discomfort: -only when lies on this breast -for a long time -no mass, discharge, change sin skin, rash   Chronic low back pain: -> 1 year, intermittent -symptoms: completely resolved since doing yoga -denies:back pain, leg pain, fevers, malaise  ROS: See pertinent positives and negatives per HPI.  Past Medical History:  Diagnosis Date  . Allergic rhinitis   . DM (diabetes mellitus) ([place])   . Glaucoma    ? of, but seeing optho and told recently not glaucoma  . High cholesterol   . HTN (hypertension)   . Internal hemorrhoids   . Osteoporosis    in 2010, has refused bisphosphnate treatment or treament other then vit d and calcium  . Panic anxiety syndrome 01/03/2013  . Pure hyperglyceridemia     Past Surgical History:  Procedure Laterality Date  . COLONOSCOPY  05/03/2006   internal hemorrhoids    Family History  Problem Relation Age of Onset  . Coronary artery disease Maternal Uncle   . Lung cancer Sister   . Cancer Sister     lung   . Breast cancer Maternal Aunt   . Colon cancer Mother     deceased age 31  . Cancer Mother 51  . Hypertension Mother   . Diabetes Father   . Hypertension Father   . Diabetes Paternal Aunt     Social History   Social History  . Marital status: Married    Spouse name: N/A  . Number of children: N/A  . Years of education: N/A   Social History Main Topics  . Smoking status: Never Smoker  . Smokeless tobacco: None  . Alcohol use Yes     Comment: occ - very rare  .  Drug use: No  . Sexual activity: Not Asked   Other Topics Concern  . 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 [employer] Situation: lives with husband and son - has 4 children, son in Facilities manager school, son animation, daughter is Pension scheme manager      Spiritual Beliefs: Baha'i faith - all religions are one, prayer based, no health care restrictions      Lifestyle: no CV exercise; diet is good              Current Outpatient Prescriptions:  .  amLODipine (NORVASC) 5 MG tablet, Take 1 tablet (5 mg total) by mouth daily., Disp: 90 tablet, Rfl: 3 .  FREESTYLE LITE test strip, , Disp: , Rfl:  .  Lancets (FREESTYLE) lancets, , Disp: , Rfl:  .  losartan (COZAAR) 25 MG tablet, Take 1 tablet (25 mg total) by mouth daily., Disp: 90 tablet, Rfl: 3 .  metFORMIN (GLUCOPHAGE) 1000 MG tablet, TAKE ONE TABLET BY MOUTH TWICE DAILY WITH A MEAL, Disp: 60 tablet, Rfl: 3 .  rosuvastatin (CRESTOR) 10 MG tablet, TAKE ONE TABLET BY MOUTH ONCE DAILY, Disp: 30 tablet, Rfl: 3 .  Specialty Vitamins Products (ONE-A-DAY BONE STRENGTH PO), Take 1 tablet by mouth daily. , Disp: , Rfl:  EXAM:  Vitals:   10/03/15 0909  BP: 140/80  Pulse: 66  Temp: 98.1 F (36.7 C)    Body mass index is 21.51 kg/m.  GENERAL: vitals reviewed and listed above, alert, oriented, appears well hydrated and in no acute distress  HEENT: atraumatic, conjunttiva clear, no obvious abnormalities on inspection of external nose and ears  NECK: no obvious masses on inspection  LUNGS: clear to auscultation bilaterally, no wheezes, rales or rhonchi, good air movement  BREAST: normal appearance and exam  CV: HRRR, no peripheral edema  MS: moves all extremities without noticeable abnormality  PSYCH: pleasant and cooperative, no obvious depression or anxiety  ASSESSMENT AND PLAN:  Discussed the following assessment and plan:  Type 2 diabetes mellitus without complication, without long-term current use  of insulin (HCC) - Plan: Hemoglobin A1c  Essential hypertension, benign - Plan: Basic metabolic panel, CBC (no diff)  Hyperlipemia - Plan: Lipid Panel  Breast pain  Need for hepatitis C screening test - Plan: Hepatitis C antibody -labs: hgba1c, BMP, cbc, lipids -eye exam advised -flu vaccine offered/advised -breast exam normal, offered exam at breast center though seems from her lying on this breast only and no abnormal findings - she declined and plans to do monthly breast exams and CPE in 3-4 months -CPE in 3-4 months -Patient advised to return or notify a doctor immediately if symptoms worsen or persist or new concerns arise.  Patient Instructions  BEFORE YOU LEAVE: -follow up: Physical in 3-4 months; does not have to come fasting unless convenient for her -flu shot -letter  We have ordered labs or studies at this visit. It can take up to 1-2 weeks for results and processing. IF results require follow up or explanation, we will call you with instructions. Clinically stable results will be released to your [facility]. If you have not heard from [place] or cannot find your results in [facility] in 2 weeks please contact our office at [phone].  If you are not yet signed up for [facility], please consider signing up.   We recommend the following healthy lifestyle for LIFE: 1) Small portions.   Tip: eat off of a salad plate instead of a dinner plate.  Tip: It is ok to feel hungry after a meal - that likely means you ate an appropriate portion.  Tip: if you need more or a snack choose fruits, veggies and/or a handful of nuts or seeds.  2) Eat a healthy clean diet.  * Tip: Avoid (less then 1 serving per week): processed foods, sweets, sweetened drinks, white starches (rice, flour, bread, potatoes, pasta, etc), red meat, fast foods, butter  *Tip: CHOOSE instead   * 5-9 servings per day of fresh or frozen fruits and vegetables (but not corn, potatoes, bananas, canned or dried fruit)   *nuts and  seeds, beans   *olives and olive oil   *small portions of lean meats such as fish and white chicken    *small portions of whole grains  3)Get at least 150 minutes of sweaty aerobic exercise per week.  4)Reduce stress - consider counseling, meditation and relaxation to balance other aspects of your life.           Colin Benton R., DO

## 2015-10-03 ENCOUNTER — Encounter: Payer: Self-pay | Admitting: *Deleted

## 2015-10-03 ENCOUNTER — Ambulatory Visit (INDEPENDENT_AMBULATORY_CARE_PROVIDER_SITE_OTHER): Payer: BC Managed Care – PPO | Admitting: Family Medicine

## 2015-10-03 ENCOUNTER — Encounter: Payer: Self-pay | Admitting: Family Medicine

## 2015-10-03 VITALS — BP 140/80 | HR 66 | Temp 98.1°F | Ht 63.0 in | Wt 121.4 lb

## 2015-10-03 DIAGNOSIS — E119 Type 2 diabetes mellitus without complications: Secondary | ICD-10-CM

## 2015-10-03 DIAGNOSIS — Z23 Encounter for immunization: Secondary | ICD-10-CM

## 2015-10-03 DIAGNOSIS — N644 Mastodynia: Secondary | ICD-10-CM

## 2015-10-03 DIAGNOSIS — E785 Hyperlipidemia, unspecified: Secondary | ICD-10-CM | POA: Diagnosis not present

## 2015-10-03 DIAGNOSIS — I10 Essential (primary) hypertension: Secondary | ICD-10-CM

## 2015-10-03 DIAGNOSIS — Z1159 Encounter for screening for other viral diseases: Secondary | ICD-10-CM

## 2015-10-03 LAB — CBC
HEMATOCRIT: 39.1 % (ref 36.0–46.0)
Hemoglobin: 13.1 g/dL (ref 12.0–15.0)
MCHC: 33.4 g/dL (ref 30.0–36.0)
MCV: 85.7 fl (ref 78.0–100.0)
Platelets: 257 10*3/uL (ref 150.0–400.0)
RBC: 4.56 Mil/uL (ref 3.87–5.11)
RDW: 14.6 % (ref 11.5–15.5)
WBC: 4.3 10*3/uL (ref 4.0–10.5)

## 2015-10-03 LAB — BASIC METABOLIC PANEL
BUN: 11 mg/dL (ref 6–23)
CALCIUM: 9.3 mg/dL (ref 8.4–10.5)
CHLORIDE: 106 meq/L (ref 96–112)
CO2: 29 mEq/L (ref 19–32)
Creatinine, Ser: 0.66 mg/dL (ref 0.40–1.20)
GFR: 96.51 mL/min (ref >60.00)
Glucose, Bld: 114 mg/dL — ABNORMAL HIGH (ref 70–99)
Potassium: 4.3 mEq/L (ref 3.5–5.1)
Sodium: 142 mEq/L (ref 135–145)

## 2015-10-03 LAB — HEMOGLOBIN A1C: Hgb A1c MFr Bld: 6.3 % (ref 4.6–6.5)

## 2015-10-03 LAB — LIPID PANEL
CHOLESTEROL: 149 mg/dL (ref 0–200)
HDL: 75.2 mg/dL (ref >39.00)
LDL Cholesterol: 54 mg/dL (ref 0–99)
NONHDL: 74.17
Total CHOL/HDL Ratio: 2
Triglycerides: 103 mg/dL (ref 0.0–149.0)
VLDL: 20.6 mg/dL (ref 0.0–40.0)

## 2015-10-03 NOTE — Patient Instructions (Addendum)
BEFORE YOU LEAVE: -follow up: Physical in 3-4 months; does not have to come fasting unless convenient for her -flu shot -letter  Get your eye exam  We have ordered labs or studies at this visit. It can take up to 1-2 weeks for results and processing. IF results require follow up or explanation, we will call you with instructions. Clinically stable results will be released to your [facility]. If you have not heard from [place] or cannot find your results in [place] in 2 weeks please contact our office at [phone].  If you are not yet signed up for [facility], please consider signing up.   We recommend the following healthy lifestyle for LIFE: 1) Small portions.   Tip: eat off of a salad plate instead of a dinner plate.  Tip: It is ok to feel hungry after a meal - that likely means you ate an appropriate portion.  Tip: if you need more or a snack choose fruits, veggies and/or a handful of nuts or seeds.  2) Eat a healthy clean diet.  * Tip: Avoid (less then 1 serving per week): processed foods, sweets, sweetened drinks, white starches (rice, flour, bread, potatoes, pasta, etc), red meat, fast foods, butter  *Tip: CHOOSE instead   * 5-9 servings per day of fresh or frozen fruits and vegetables (but not corn, potatoes, bananas, canned or dried fruit)   *nuts and seeds, beans   *olives and olive oil   *small portions of lean meats such as fish and white chicken    *small portions of whole grains  3)Get at least 150 minutes of sweaty aerobic exercise per week.  4)Reduce stress - consider counseling, meditation and relaxation to balance other aspects of your life.

## 2015-10-03 NOTE — Progress Notes (Signed)
Pre visit review using our clinic review tool, if applicable. No additional management support is needed unless otherwise documented below in the visit note. 

## 2015-10-04 LAB — HEPATITIS C ANTIBODY: HCV AB: NEGATIVE

## 2015-10-09 ENCOUNTER — Ambulatory Visit (INDEPENDENT_AMBULATORY_CARE_PROVIDER_SITE_OTHER): Payer: BC Managed Care – PPO | Admitting: *Deleted

## 2015-10-09 DIAGNOSIS — Z111 Encounter for screening for respiratory tuberculosis: Secondary | ICD-10-CM | POA: Diagnosis not present

## 2015-10-11 ENCOUNTER — Ambulatory Visit (INDEPENDENT_AMBULATORY_CARE_PROVIDER_SITE_OTHER)
Admission: RE | Admit: 2015-10-11 | Discharge: 2015-10-11 | Disposition: A | Discharge status: Home / Self Care | Payer: BC Managed Care – PPO | Source: Ambulatory Visit | Attending: Family Medicine | Admitting: Family Medicine

## 2015-10-11 ENCOUNTER — Other Ambulatory Visit: Payer: Self-pay | Admitting: *Deleted

## 2015-10-11 ENCOUNTER — Ambulatory Visit (INDEPENDENT_AMBULATORY_CARE_PROVIDER_SITE_OTHER): Payer: BC Managed Care – PPO | Admitting: Family Medicine

## 2015-10-11 DIAGNOSIS — R7611 Nonspecific reaction to tuberculin skin test without active tuberculosis: Secondary | ICD-10-CM

## 2015-10-11 LAB — TB SKIN TEST
INDURATION: 15 mm
TB SKIN TEST: POSITIVE

## 2015-10-11 NOTE — Progress Notes (Signed)
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  Past Medical History:  Diagnosis Date  . Allergic rhinitis   . DM (diabetes mellitus) ([place])   . Glaucoma    ? of, but seeing optho and told recently not glaucoma  . High cholesterol   . HTN (hypertension)   . Internal hemorrhoids   . Osteoporosis    in 2010, has refused bisphosphnate treatment or treament other then vit d and calcium  . Panic anxiety syndrome 01/03/2013  . Pure hyperglyceridemia     Past Surgical History:  Procedure Laterality Date  . COLONOSCOPY  05/03/2006   internal hemorrhoids    Family History  Problem Relation Age of Onset  . Coronary artery disease Maternal Uncle   . Lung cancer Sister   . Cancer Sister     lung   . Breast cancer Maternal Aunt   . Colon cancer Mother     deceased age 66  . Cancer Mother 56  . Hypertension Mother   . Diabetes Father   . Hypertension Father   . Diabetes Paternal Aunt     Social History   Social History  . Marital status: Married    Spouse name: N/A  . Number of children: N/A  . Years of education: N/A   Social History Main Topics  . Smoking status: Never Smoker  . Smokeless tobacco: Not on file  . Alcohol use Yes     Comment: occ - very rare  . Drug use: No  . Sexual activity: Not on file   Other Topics Concern  . Not on file   Social History Narrative   Work or School: 2nd grade at [employer] Situation: lives with husband and son - has 4 children, son in Facilities manager school, son animation, daughter is Pension scheme manager      Spiritual Beliefs: Baha'i faith - all religions are one, prayer based, no health care restrictions      Lifestyle: no CV  exercise; diet is good              Current Outpatient Prescriptions:  .  amLODipine (NORVASC) 5 MG tablet, Take 1 tablet (5 mg total) by mouth daily., Disp: 90 tablet, Rfl: 3 .  FREESTYLE LITE test strip, , Disp: , Rfl:  .  Lancets (FREESTYLE) lancets, , Disp: , Rfl:  .  losartan (COZAAR) 25 MG tablet, Take 1 tablet (25 mg total) by mouth daily., Disp: 90 tablet, Rfl: 3 .  metFORMIN (GLUCOPHAGE) 1000 MG tablet, TAKE ONE TABLET BY MOUTH TWICE DAILY WITH A MEAL, Disp: 60 tablet, Rfl: 3 .  rosuvastatin (CRESTOR) 10 MG tablet, TAKE ONE TABLET BY MOUTH ONCE DAILY, Disp: 30 tablet, Rfl: 3 .  Specialty Vitamins Products (ONE-A-DAY BONE STRENGTH PO), Take 1 tablet by mouth daily. , Disp: , Rfl:   EXAM:  There were no vitals filed for this visit.  There is no height or weight on file to calculate BMI.  GENERAL: vitals reviewed and listed above, alert, oriented, appears well hydrated and in no acute distress  HEENT: atraumatic, conjunttiva clear, no obvious abnormalities on inspection of external nose and ears  NECK: no obvious masses on  inspection  LUNGS: clear to auscultation bilaterally, no wheezes, rales or rhonchi, good air movement  CV: HRRR, no peripheral edema  SKIN: tb skin test with 21mm induration  MS: moves all extremities without noticeable abnormality  PSYCH: pleasant and cooperative, no obvious depression or anxiety  ASSESSMENT AND PLAN:  Discussed the following assessment and plan:  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  There are no Patient Instructions on file for this visit.  Colin Benton R., DO

## 2015-10-15 ENCOUNTER — Other Ambulatory Visit: Payer: Self-pay | Admitting: Family Medicine

## 2015-10-16 ENCOUNTER — Other Ambulatory Visit (INDEPENDENT_AMBULATORY_CARE_PROVIDER_SITE_OTHER): Payer: BC Managed Care – PPO

## 2015-10-16 DIAGNOSIS — R7611 Nonspecific reaction to tuberculin skin test without active tuberculosis: Secondary | ICD-10-CM

## 2015-10-19 LAB — QUANTIFERON TB GOLD ASSAY (BLOOD)
INTERFERON GAMMA RELEASE ASSAY: NEGATIVE
MITOGEN-NIL SO: 7.54 [IU]/mL
QUANTIFERON NIL VALUE: 0.04 [IU]/mL

## 2015-10-24 ENCOUNTER — Other Ambulatory Visit: Payer: Self-pay | Admitting: Family Medicine

## 2015-11-05 ENCOUNTER — Emergency Department (HOSPITAL_COMMUNITY)
Admission: EM | Admit: 2015-11-05 | Discharge: 2015-11-06 | Disposition: A | Discharge status: Home / Self Care | Payer: No Typology Code available for payment source | Attending: Emergency Medicine | Admitting: Emergency Medicine

## 2015-11-05 ENCOUNTER — Emergency Department (HOSPITAL_COMMUNITY): Payer: No Typology Code available for payment source

## 2015-11-05 ENCOUNTER — Encounter (HOSPITAL_COMMUNITY): Payer: Self-pay | Admitting: Emergency Medicine

## 2015-11-05 DIAGNOSIS — Z7984 Long term (current) use of oral hypoglycemic drugs: Secondary | ICD-10-CM | POA: Insufficient documentation

## 2015-11-05 DIAGNOSIS — Y92410 Unspecified street and highway as the place of occurrence of the external cause: Secondary | ICD-10-CM | POA: Diagnosis not present

## 2015-11-05 DIAGNOSIS — I10 Essential (primary) hypertension: Secondary | ICD-10-CM | POA: Diagnosis not present

## 2015-11-05 DIAGNOSIS — S20212A Contusion of left front wall of thorax, initial encounter: Secondary | ICD-10-CM

## 2015-11-05 DIAGNOSIS — E119 Type 2 diabetes mellitus without complications: Secondary | ICD-10-CM | POA: Insufficient documentation

## 2015-11-05 DIAGNOSIS — Y999 Unspecified external cause status: Secondary | ICD-10-CM | POA: Diagnosis not present

## 2015-11-05 DIAGNOSIS — Y939 Activity, unspecified: Secondary | ICD-10-CM | POA: Diagnosis not present

## 2015-11-05 DIAGNOSIS — S299XXA Unspecified injury of thorax, initial encounter: Secondary | ICD-10-CM | POA: Diagnosis present

## 2015-11-05 MED ORDER — OXYCODONE-ACETAMINOPHEN 5-325 MG PO TABS
1.0000 | ORAL_TABLET | Freq: Once | ORAL | Status: AC
  Administered 2015-11-06: 1 via ORAL
  Filled 2015-11-05: qty 1

## 2015-11-05 MED ORDER — KETOROLAC TROMETHAMINE 15 MG/ML IJ SOLN
15.0000 mg | Freq: Once | INTRAMUSCULAR | Status: AC
  Administered 2015-11-05: 15 mg via INTRAMUSCULAR
  Filled 2015-11-05: qty 1

## 2015-11-05 MED ORDER — FENTANYL CITRATE (PF) 100 MCG/2ML IJ SOLN
50.0000 ug | INTRAMUSCULAR | Status: DC | PRN
  Administered 2015-11-05: 50 ug via INTRAVENOUS
  Filled 2015-11-05: qty 2
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
## 2015-11-06 MED ORDER — NAPROXEN 500 MG PO TABS: 500.0000 mg | ORAL_TABLET | Freq: Two times a day (BID) | ORAL | 0 refills | Status: DC

## 2015-11-06 MED ORDER — PROPOFOL 10 MG/ML IV BOLUS: 100.0000 mg | Freq: Once | INTRAVENOUS | Status: DC

## 2015-11-06 MED ORDER — HYDROCODONE-ACETAMINOPHEN 5-325 MG PO TABS: 1.0000 | ORAL_TABLET | Freq: Four times a day (QID) | ORAL | 0 refills | Status: DC | PRN
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
## 2015-11-14 ENCOUNTER — Encounter (HOSPITAL_COMMUNITY): Payer: Self-pay | Admitting: Emergency Medicine

## 2015-11-14 ENCOUNTER — Ambulatory Visit (HOSPITAL_COMMUNITY)
Admission: EM | Admit: 2015-11-14 | Discharge: 2015-11-14 | Disposition: A | Discharge status: Home / Self Care | Payer: BC Managed Care – PPO | Attending: Family Medicine | Admitting: Family Medicine

## 2015-11-14 ENCOUNTER — Ambulatory Visit: Payer: Self-pay | Admitting: Family Medicine

## 2015-11-14 DIAGNOSIS — M791 Myalgia: Secondary | ICD-10-CM | POA: Diagnosis not present

## 2015-11-14 DIAGNOSIS — R52 Pain, unspecified: Secondary | ICD-10-CM

## 2015-11-14 DIAGNOSIS — M7918 Myalgia, other site: Secondary | ICD-10-CM

## 2015-11-14 MED ORDER — METHOCARBAMOL 500 MG PO TABS: 500.0000 mg | ORAL_TABLET | Freq: Every evening | ORAL | 0 refills | Status: DC | PRN
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
## 2015-11-18 ENCOUNTER — Encounter: Payer: Self-pay | Admitting: *Deleted

## 2015-11-18 ENCOUNTER — Encounter: Payer: Self-pay | Admitting: Family Medicine

## 2015-11-18 ENCOUNTER — Ambulatory Visit (INDEPENDENT_AMBULATORY_CARE_PROVIDER_SITE_OTHER): Payer: BC Managed Care – PPO | Admitting: Family Medicine

## 2015-11-18 VITALS — BP 108/60 | HR 71 | Temp 98.2°F | Ht 63.0 in | Wt 125.0 lb

## 2015-11-18 DIAGNOSIS — M8580 Other specified disorders of bone density and structure, unspecified site: Secondary | ICD-10-CM

## 2015-11-18 DIAGNOSIS — M545 Low back pain, unspecified: Secondary | ICD-10-CM

## 2015-11-18 DIAGNOSIS — R0789 Other chest pain: Secondary | ICD-10-CM
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
## 2015-11-18 NOTE — Progress Notes (Signed)
Pre visit review using our clinic review tool, if applicable. No additional management support is needed unless otherwise documented below in the visit note. 

## 2015-11-18 NOTE — Patient Instructions (Addendum)
BEFORE YOU LEAVE: -follow up: in 3 months and as needed -xray sheet -low back exercises -work note seen today and we are referring her to a specialist for further evaluation and management.  Work restriction for no repetitive bending with twisting or heavy lifting, pushing or pulling over 20 lbs until evaluated by specialist or 1 week.  Get the xray for your back.  -We placed a referral for you as discussed to the specialist per your request. It usually takes about 1-2 weeks to process and schedule this referral. If you have not heard from [place] regarding this appointment in 2 weeks please contact our office.  Will defer to specialist to determine if further work limitations needed.

## 2015-11-22 ENCOUNTER — Ambulatory Visit (INDEPENDENT_AMBULATORY_CARE_PROVIDER_SITE_OTHER)
Admission: RE | Admit: 2015-11-22 | Discharge: 2015-11-22 | Disposition: A | Discharge status: Home / Self Care | Payer: BC Managed Care – PPO | Source: Ambulatory Visit | Attending: Family Medicine | Admitting: Family Medicine

## 2015-11-22 DIAGNOSIS — M545 Low back pain, unspecified: Secondary | ICD-10-CM

## 2015-12-10 ENCOUNTER — Encounter: Payer: Self-pay | Admitting: Family Medicine

## 2015-12-10 ENCOUNTER — Ambulatory Visit (INDEPENDENT_AMBULATORY_CARE_PROVIDER_SITE_OTHER): Payer: BC Managed Care – PPO | Admitting: Family Medicine

## 2015-12-10 DIAGNOSIS — M549 Dorsalgia, unspecified: Secondary | ICD-10-CM | POA: Insufficient documentation

## 2015-12-10 DIAGNOSIS — M545 Low back pain, unspecified: Secondary | ICD-10-CM

## 2015-12-10 DIAGNOSIS — R0781 Pleurodynia: Secondary | ICD-10-CM | POA: Diagnosis not present

## 2015-12-10 MED ORDER — METHOCARBAMOL 500 MG PO TABS: 500.0000 mg | ORAL_TABLET | Freq: Two times a day (BID) | ORAL | 0 refills | Status: DC | PRN
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
## 2016-01-16 ENCOUNTER — Other Ambulatory Visit: Payer: Self-pay | Admitting: *Deleted

## 2016-01-16 DIAGNOSIS — M545 Low back pain, unspecified: Secondary | ICD-10-CM

## 2016-01-19 ENCOUNTER — Other Ambulatory Visit: Payer: Self-pay | Admitting: Family Medicine

## 2016-01-22 ENCOUNTER — Ambulatory Visit: Payer: BC Managed Care – PPO | Admitting: Physical Therapy

## 2016-01-28 ENCOUNTER — Ambulatory Visit: Payer: BC Managed Care – PPO | Attending: Family Medicine | Admitting: Physical Therapy

## 2016-01-28 DIAGNOSIS — G8929 Other chronic pain: Secondary | ICD-10-CM | POA: Diagnosis present

## 2016-01-28 DIAGNOSIS — M25652 Stiffness of left hip, not elsewhere classified: Secondary | ICD-10-CM | POA: Diagnosis present

## 2016-01-28 DIAGNOSIS — M545 Low back pain: Secondary | ICD-10-CM | POA: Insufficient documentation

## 2016-01-28 DIAGNOSIS — M6281 Muscle weakness (generalized): Secondary | ICD-10-CM

## 2016-01-28 NOTE — Patient Instructions (Signed)
     Reducing Load   Copyright  VHI. All rights reserved.  BODY MECHANICS Tips Good body mechanics are important during activities of daily living. The practice of good body mechanics will: -help distribute weight throughout the skeleton in a more anatomically correct manner thus stimulating more normal forces on the bones, and encouraging stronger, healthier, denser bones. -reduce unnatural forces on bones, ligaments, joints and muscles and reduce risk of fracture, other injury or back pain. A WORD ON BODY POSITIONING: Sitting is the hardest position for the back. Lying on the back is the easiest. Standing, in good body alignment, is somewhere between. A good motto is: Sit less, stand more, and, when you can't do that, lie down on your back and exercise to strengthen it.  Copyright  VHI. All rights reserved.       Supine to Sit (Active)   Lie on back, left leg bent. Roll to other side. From side-lying, sit up on side of bed. Complete ___ sets of ___ repetitions. Perform ___ sessions per day.  Copyright  VHI. All rights reserved.    Housework - Reaching Down   If you are unable to bend your knees or squat, use a lazy Susan to keep items within easy reach. Store only light, unbreakable items on the lowest shelves, and use a reacher to pick them up.  Copyright  VHI. All rights reserved.  Low Shelf   Squat down, and bring item close to lift.   Copyright  VHI. All rights reserved.  Lifting Principles .Maintain proper posture and head alignment. .Slide object as close as possible before lifting. .Move obstacles out of the way. .Test before lifting; ask for help if too heavy. .Tighten stomach muscles without holding breath. .Use smooth movements; do not jerk. .Use legs to do the work, and pivot with feet. .Distribute the work load symmetrically and close to the center of trunk. .Push instead of pull whenever possible.  Copyright  VHI. All rights reserved.    Posture - Standing   Good posture is important. Avoid slouching and forward head thrust. Maintain curve in low back and align ears over shoul- ders, hips over ankles.   Copyright  VHI. All rights reserved.   Posture - Sitting   Sit upright, head facing forward. Try using a roll to support lower back. Keep shoulders relaxed, and avoid rounded back. Keep hips level with knees. Avoid crossing legs for long periods.   Copyright  VHI. All rights reserved.  Ideal Posture Use with figures on 3 (2 of 2): 1.Head erect 2.Chin in 3.Chest and navel aligned 4.Spinal curves maintained 5.Knees relaxed 6.Shoulders and hips aligned 7.Feet slightly apart 8.Toes and arches active 9.Abdomen taut (breathe with diaphragm) 10.Arms at sides Ideal posture is: -pain free. -achieved with practice, mindful interest, and body awareness.  Copyright  VHI. All rights reserved.     Move heavy items one at a time, or move portions of the contents.   Posture Awareness     Stand and check posture: Jut chin, pull back to comfortable position. Tilt pelvis forward, back; be sure back is not swayed. Roll from heels to balls of feet, then distribute your weight evenly. Picture a line through spine pulling you erect. Focus on breathing. Good Posture = Better Breathing. Check ____ times per day.  http://gt2.exer.us/873   Copyright  VHI. All rights reserved.   
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
## 2016-02-04 ENCOUNTER — Ambulatory Visit: Payer: BC Managed Care – PPO | Admitting: Physical Therapy

## 2016-02-04 DIAGNOSIS — M25652 Stiffness of left hip, not elsewhere classified: Secondary | ICD-10-CM

## 2016-02-04 DIAGNOSIS — G8929 Other chronic pain: Secondary | ICD-10-CM

## 2016-02-04 DIAGNOSIS — M545 Low back pain: Principal | ICD-10-CM

## 2016-02-04 DIAGNOSIS — M6281 Muscle weakness (generalized): Secondary | ICD-10-CM

## 2016-02-04 NOTE — Therapy (Signed)
[place], [place], [address] Phone: [phone]   Fax:  [phone]  Physical Therapy Treatment  Patient Details  Name: Victoria Neal MRN: [id number] Date of Birth: 06-Oct-1955 Referring Provider: Dr. Clearance Coots  Encounter Date: 02/04/2016      PT End of Session - 02/04/16 0945    Visit Number 2   Number of Visits 16   Date for PT Re-Evaluation 03/24/16   PT Start Time 0937   PT Stop Time 1030   PT Time Calculation (min) 53 min   Activity Tolerance Patient tolerated treatment well   Behavior During Therapy [facility] for tasks assessed/performed      Past Medical History:  Diagnosis Date  . Allergic rhinitis   . DM (diabetes mellitus) ([place])   . Glaucoma    ? of, but seeing optho and told recently not glaucoma  . High cholesterol   . HTN (hypertension)   . Internal hemorrhoids   . Osteoporosis    in 2010, has refused bisphosphnate treatment or treament other then vit d and calcium  . Panic anxiety syndrome 01/03/2013  . Pure hyperglyceridemia     Past Surgical History:  Procedure Laterality Date  . COLONOSCOPY  05/03/2006   internal hemorrhoids    There were no vitals filed for this visit.      Subjective Assessment - 02/04/16 0939    Subjective Did the exercises you gave me.  I walked.  Pain is always there.     Currently in Pain? Yes   Pain Score 4    Pain Location Back   Pain Orientation Lower   Pain Descriptors / Indicators Aching;Sore;Tightness   Pain Type Chronic pain   Pain Radiating Towards none    Pain Onset More than a month ago   Pain Frequency Constant   Aggravating Factors  laying down, bending    Pain Relieving Factors no leg pain in prone            [facility] Adult PT Treatment/Exercise - 02/04/16 0943      Lumbar Exercises: Stretches   Active Hamstring Stretch 2 reps;30 seconds   Prone on Elbows Stretch 1 rep;60 seconds   Piriformis Stretch 2 reps;30 seconds     Lumbar Exercises: Prone   Straight Leg Raise 10 reps   Other Prone Lumbar Exercises prone knee bend  x 10    Other Prone Lumbar Exercises prone Tr A set x 10      Lumbar Exercises: Quadruped   Opposite Arm/Leg Raise Right arm/Left leg;Left arm/Right leg;5 reps   Opposite Arm/Leg Raise Limitations cues for maintaining neutral    Other Quadruped Lumbar Exercises rocking with flat back x 8      Electrical Stimulation   Electrical Stimulation Location lumbar bilat.    Electrical Stimulation Action IFC   Electrical Stimulation Parameters 10    Electrical Stimulation Goals Pain     Manual Therapy   Manual Therapy Soft tissue mobilization;Myofascial release   Soft tissue mobilization lumbar paraspinals    Myofascial Release low back Rt hip                 PT Education - 02/04/16 0945    Education provided Yes   Education Details prone progression HEP    Person(s) Educated Patient   Methods Explanation;Handout   Comprehension Verbalized understanding;Returned demonstration;Need further instruction          PT Short Term Goals - 02/04/16 1023  PT SHORT TERM GOAL #1   Title Pt will be I with initial HEP for LE strength and flexibility to reduce LBP.    Status Partially Met     PT SHORT TERM GOAL #2   Title Pt will be able to lie supine at home and in the clinic without increasing low back pain.    Status On-going     PT SHORT TERM GOAL #3   Title Pt will be able to understand posture, lifting and precautions related to her bone density and demo in clinic.    Status On-going           PT Long Term Goals - 01/29/16 0752      PT LONG TERM GOAL #1   Title Pt will begin walking program for continued fitness, health and wellness.    Time 8   Period Weeks   Status New     PT LONG TERM GOAL #2   Title Pt will demo 4+/5 in LE strength to facilitate mobility, safety and gait stability.    Time 8   Period Weeks   Status New     PT LONG TERM GOAL #3    Title Pt will be able to report no pain increase with ADLs, housework and care for grandson.    Time 8   Period Weeks   Status New     PT LONG TERM GOAL #4   Title Pt will be I with more advanced HEP and concepts of posture.    Time 8   Period Weeks   Status New               Plan - 02/04/16 0946    Clinical Impression Statement Patient reports less leg pain.  Has modified her body mechanics for bed mobility and transfers. No supine today due to increase in pain.  No goals met further.    PT Next Visit Plan long axis traction to L hip, prone progression    PT Home Exercise Plan prone press up, hip ext , prone pelvic press , seated ham and piriformis   Consulted and Agree with Plan of Care Patient      Patient will benefit from skilled therapeutic intervention in order to improve the following deficits and impairments:  Postural dysfunction, Decreased strength, Decreased mobility, Improper body mechanics, Impaired flexibility, Pain, Dizziness, Increased fascial restricitons, Difficulty walking, Decreased range of motion  Visit Diagnosis: Chronic bilateral low back pain, with sciatica presence unspecified  Muscle weakness (generalized)  Stiffness of left hip, not elsewhere classified     Problem List Patient Active Problem List   Diagnosis Date Noted  . Back pain 12/10/2015  . Rib pain 12/10/2015  . Osteopenia 08/27/2015  . Type 2 diabetes mellitus without complication ([place]) 15/40/0867  . Essential hypertension, benign 09/27/2012  . Hyperlipemia 09/02/2009    Victoria Neal 02/04/2016, 10:26 AM  [place], [place], [address] Phone: [phone]   Fax:  [phone]  Name: Victoria Neal MRN: [id number] Date of Birth: May 14, 1955  Raeford Razor, PT 02/04/16 10:27 AM Phone: [phone] Fax: [phone]

## 2016-02-04 NOTE — Patient Instructions (Addendum)
      HIP: Hamstrings - Short Sitting    Rest leg on raised surface. Keep knee straight. Lift chest. Hold _30__ seconds. __3_ reps per set, _1-2 sets per day, __5_ days per week  Copyright  VHI. All rights reserved.     Figure 4: seated, cross ankle over opposite knee.  Keep hips down and back straight, lean forward.   Hold 30 sec x 3 each side, 2 times /day.

## 2016-02-07 ENCOUNTER — Ambulatory Visit: Payer: BC Managed Care – PPO | Admitting: Physical Therapy

## 2016-02-11 ENCOUNTER — Ambulatory Visit: Payer: BC Managed Care – PPO | Attending: Family Medicine | Admitting: Physical Therapy

## 2016-02-11 DIAGNOSIS — M545 Low back pain: Secondary | ICD-10-CM | POA: Insufficient documentation

## 2016-02-11 DIAGNOSIS — G8929 Other chronic pain: Secondary | ICD-10-CM | POA: Diagnosis present

## 2016-02-11 DIAGNOSIS — M6281 Muscle weakness (generalized): Secondary | ICD-10-CM | POA: Insufficient documentation

## 2016-02-11 DIAGNOSIS — M25652 Stiffness of left hip, not elsewhere classified: Secondary | ICD-10-CM | POA: Diagnosis present

## 2016-02-11 NOTE — Therapy (Signed)
[place], [place], [address] Phone: [phone]   Fax:  [phone]  Physical Therapy Treatment  Patient Details  Name: Victoria Neal MRN: [id number] Date of Birth: 1955-08-07 Referring Provider: Dr. Clearance Coots  Encounter Date: 02/11/2016      PT End of Session - 02/11/16 0948    Visit Number 3   Number of Visits 16   Date for PT Re-Evaluation 03/24/16   PT Start Time 0933   PT Stop Time 1019   PT Time Calculation (min) 46 min   Activity Tolerance Patient tolerated treatment well   Behavior During Therapy [facility] for tasks assessed/performed      Past Medical History:  Diagnosis Date  . Allergic rhinitis   . DM (diabetes mellitus) ([place])   . Glaucoma    ? of, but seeing optho and told recently not glaucoma  . High cholesterol   . HTN (hypertension)   . Internal hemorrhoids   . Osteoporosis    in 2010, has refused bisphosphnate treatment or treament other then vit d and calcium  . Panic anxiety syndrome 01/03/2013  . Pure hyperglyceridemia     Past Surgical History:  Procedure Laterality Date  . COLONOSCOPY  05/03/2006   internal hemorrhoids    There were no vitals filed for this visit.      Subjective Assessment - 02/11/16 0935    Subjective This is helping.  Pain is more intermittent and pain is more tolerable.  Sometimes 2 hours after I leave pain is really bad.  LLE pain is more intermittent.  Standing Lt LE gets tired.    Currently in Pain? Yes   Pain Score 4    Pain Location Back   Pain Orientation Lower   Pain Descriptors / Indicators Burning;Aching;Sore   Pain Type Chronic pain   Pain Onset More than a month ago   Pain Frequency Intermittent   Aggravating Factors  laying down with back flat, bending (flexion)    Pain Relieving Factors prone                         [facility] Adult PT Treatment/Exercise - 02/11/16 0940      Lumbar Exercises: Stretches    Active Hamstring Stretch 2 reps;30 seconds   Pelvic Tilt --   Pelvic Tilt Limitations --   Press Ups 5 reps;10 seconds   Piriformis Stretch 2 reps;30 seconds   Piriformis Stretch Limitations in supine only able to cross ankle but not full figure 4      Lumbar Exercises: Prone   Straight Leg Raise 10 reps   Other Prone Lumbar Exercises prone knee bend  x 10    Other Prone Lumbar Exercises prone Tr A set x 10      Lumbar Exercises: Quadruped   Plank sidelying L trunk stretch for 2 min with manual A    Other Quadruped Lumbar Exercises child pose, rocking   added lateral stretching each side      Cryotherapy   Number Minutes Cryotherapy 15 Minutes   Cryotherapy Location Lumbar Spine   Type of Cryotherapy Ice pack     Electrical Stimulation   Electrical Stimulation Location lumbar bilat.    Electrical Stimulation Action [employer]   Electrical Stimulation Parameters 15   Electrical Stimulation Goals Pain                PT Education - 02/11/16 [phone]    Education provided Yes  Education Details stretch vs Pain    Person(s) Educated Patient   Methods Explanation   Comprehension Verbalized understanding;Returned demonstration          PT Short Term Goals - 02/04/16 1023      PT SHORT TERM GOAL #1   Title Pt will be I with initial HEP for LE strength and flexibility to reduce LBP.    Status Partially Met     PT SHORT TERM GOAL #2   Title Pt will be able to lie supine at home and in the clinic without increasing low back pain.    Status On-going     PT SHORT TERM GOAL #3   Title Pt will be able to understand posture, lifting and precautions related to her bone density and demo in clinic.    Status On-going           PT Long Term Goals - 01/29/16 0752      PT LONG TERM GOAL #1   Title Pt will begin walking program for continued fitness, health and wellness.    Time 8   Period Weeks   Status New     PT LONG TERM GOAL #2   Title Pt will demo 4+/5 in LE strength  to facilitate mobility, safety and gait stability.    Time 8   Period Weeks   Status New     PT LONG TERM GOAL #3   Title Pt will be able to report no pain increase with ADLs, housework and care for grandson.    Time 8   Period Weeks   Status New     PT LONG TERM GOAL #4   Title Pt will be I with more advanced HEP and concepts of posture.    Time 8   Period Weeks   Status New               Plan - 02/11/16 4514    Clinical Impression Statement Patient can lie in supine with legs extended but pain increases with hookying.  Arches back to avoid with hamstring stretch. I with HEP for prone stab   PT Next Visit Plan Quadruped, prone for stab.  manual, modalities L trunk/QL   PT Home Exercise Plan prone press up, hip ext , prone pelvic press , seated ham and piriformis   Consulted and Agree with Plan of Care Patient      Patient will benefit from skilled therapeutic intervention in order to improve the following deficits and impairments:  Postural dysfunction, Decreased strength, Decreased mobility, Improper body mechanics, Impaired flexibility, Pain, Dizziness, Increased fascial restricitons, Difficulty walking, Decreased range of motion  Visit Diagnosis: Chronic bilateral low back pain, with sciatica presence unspecified  Muscle weakness (generalized)  Stiffness of left hip, not elsewhere classified     Problem List Patient Active Problem List   Diagnosis Date Noted  . Back pain 12/10/2015  . Rib pain 12/10/2015  . Osteopenia 08/27/2015  . Type 2 diabetes mellitus without complication ([place]) 60/47/9987  . Essential hypertension, benign 09/27/2012  . Hyperlipemia 09/02/2009    Victoria Neal 02/11/2016, 10:09 AM  [facility] [address] [place], [place], [address] Phone: [phone]   Fax:  [phone]  Name: Victoria Neal MRN: [id number] Date of Birth: October 21, 1955  Raeford Razor, PT 02/11/16 10:09  AM Phone: [phone] Fax: [phone]

## 2016-02-14 ENCOUNTER — Ambulatory Visit: Payer: BC Managed Care – PPO | Admitting: Physical Therapy

## 2016-02-14 DIAGNOSIS — G8929 Other chronic pain: Secondary | ICD-10-CM

## 2016-02-14 DIAGNOSIS — M6281 Muscle weakness (generalized): Secondary | ICD-10-CM

## 2016-02-14 DIAGNOSIS — M545 Low back pain: Principal | ICD-10-CM

## 2016-02-14 DIAGNOSIS — M25652 Stiffness of left hip, not elsewhere classified: Secondary | ICD-10-CM

## 2016-02-14 NOTE — Patient Instructions (Signed)
Angry Cat, All Fours   Kneel on hands and knees. Tuck chin and tighten stomach. Exhale and round back upward. Inhale and arch back downward. Hold each position _10__ seconds. Repeat _5-10__ times per session. Do _1_ sessions per day.  Copyright  VHI. All rights reserved.    Lumbar Side-Bend (All-Fours)   Tilt head and shoulder to right side, rotate same side hip toward head. Repeat _10___ times per set. Do __1__ sets per session. Do _1___ sessions per day.  http://orth.exer.us/244   Copyright  VHI. All rights reserved.       Flexion   Sitting on knees, fold body over legs and relax head and arms on floor. Extend arms as far forward as possible. Hold _30___ seconds. Repeat __2__ times. Do __2__ sessions per day.  Bring arms to the side to get a stretch along your side   Copyright  VHI. All rights reserved.   Isometric Hold (Quadruped)   On hands and knees, slowly inhale, and then exhale. Pull navel toward spine and Hold for _5__ seconds. Continue to breathe in and out during hold. Rest for __5_ seconds. Repeat _10__ times. Do _2__ times a day.   Copyright  VHI. All rights reserved.  Bracing With Arm Raise (Quadruped)   On hands and knees find neutral spine. Tighten pelvic floor and abdominals and hold. Alternately lift arm to shoulder level. Repeat __5-10_ times. Do _1__ times a day.    Bracing With Leg Raise (Quadruped)   On hands and knees find neutral spine. Tighten pelvic floor and abdominals and hold. Alternating legs, straighten and lift to hip level. Repeat _10__ times. Do __2_ times a day.   Copyright  VHI. All rights reserved.      Copyright  VHI. All rights reserved.  Bracing With Arm / Leg Raise (Quadruped)   On hands and knees find neutral spine. Tighten pelvic floor and abdominals and hold. Alternating, lift arm to shoulder level and opposite leg to hip level. Repeat _10__ times. Do _2__ times a day.   Copyright  VHI. All rights  reserved.

## 2016-02-14 NOTE — Therapy (Signed)
[place], [place], [address] Phone: [phone]   Fax:  [phone]  Physical Therapy Treatment  Patient Details  Name: Victoria Neal MRN: [id number] Date of Birth: 1955/02/24 Referring Provider: Dr. Clearance Coots  Encounter Date: 02/14/2016      PT End of Session - 02/14/16 1015    Visit Number 4   Number of Visits 16   Date for PT Re-Evaluation 03/24/16   PT Start Time 0934   PT Stop Time 1025   PT Time Calculation (min) 51 min   Activity Tolerance Patient tolerated treatment well   Behavior During Therapy [facility] for tasks assessed/performed      Past Medical History:  Diagnosis Date  . Allergic rhinitis   . DM (diabetes mellitus) ([place])   . Glaucoma    ? of, but seeing optho and told recently not glaucoma  . High cholesterol   . HTN (hypertension)   . Internal hemorrhoids   . Osteoporosis    in 2010, has refused bisphosphnate treatment or treament other then vit d and calcium  . Panic anxiety syndrome 01/03/2013  . Pure hyperglyceridemia     Past Surgical History:  Procedure Laterality Date  . COLONOSCOPY  05/03/2006   internal hemorrhoids    There were no vitals filed for this visit.      Subjective Assessment - 02/14/16 0935    Subjective I notice my L side is tighter in my low back.  I had to go home and put ice on my back.  Pt has been using the rowing machine at the gym.     Currently in Pain? Yes   Pain Score 3    Pain Location Back   Pain Orientation Lower   Pain Descriptors / Indicators Tightness   Pain Type Chronic pain   Pain Onset More than a month ago   Pain Frequency Intermittent             [facility] Adult PT Treatment/Exercise - 02/14/16 0950      Lumbar Exercises: Aerobic   Stationary Bike NuStep L5 and UE and LE for 8 min      Lumbar Exercises: Quadruped   [place] Horse 10 reps   Single Arm Raise Right;Left;5 reps   Straight Leg Raise 5 reps   Opposite Arm/Leg Raise Right arm/Left leg;Left arm/Right leg;10 reps   Plank sideplank on elbow and legs down (modiified)    Other Quadruped Lumbar Exercises childs pose forward and lateral      Moist Heat Therapy   Number Minutes Moist Heat 10 Minutes   Moist Heat Location Lumbar Spine     Manual Therapy   Manual Therapy Soft tissue mobilization;Myofascial release   Soft tissue mobilization lumbar paraspinals    Myofascial Release low back Lt hip                 PT Education - 02/14/16 1015    Education provided Yes   Education Details avoid rowing machine (flex and rotation) and quadruped stab ex    Person(s) Educated Patient   Methods Explanation;Demonstration;Verbal cues;Tactile cues;Handout   Comprehension Verbalized understanding;Returned demonstration          PT Short Term Goals - 02/14/16 1017      PT SHORT TERM GOAL #1   Title Pt will be I with initial HEP for LE strength and flexibility to reduce LBP.    Status Achieved     PT SHORT TERM GOAL #2  Title Pt will be able to lie supine at home and in the clinic without increasing low back pain.    Baseline better    Status Partially Met     PT SHORT TERM GOAL #3   Title Pt will be able to understand posture, lifting and precautions related to her bone density and demo in clinic.    Status On-going           PT Long Term Goals - 02/14/16 1018      PT LONG TERM GOAL #1   Title Pt will begin walking program for continued fitness, health and wellness.    Status On-going     PT LONG TERM GOAL #2   Title Pt will demo 4+/5 in LE strength to facilitate mobility, safety and gait stability.    Status Unable to assess     PT LONG TERM GOAL #3   Title Pt will be able to report no pain increase with ADLs, housework and care for grandson.    Status On-going     PT LONG TERM GOAL #4   Title Pt will be I with more advanced HEP and concepts of posture.    Status On-going               Plan -  02/14/16 1016    Clinical Impression Statement Pt is improving with ability to walk and change positions with less pain.  She can manage post session pain with ice.  She was urged to do HEP only and work upper body at gym, do cardio with back support.  Eager for new exercises.  Lt side still painful but not as tender as last session.    PT Next Visit Plan Quadruped, prone for stab.  manual, modalities L trunk/QL   PT Home Exercise Plan prone press up, hip ext , prone pelvic press , seated ham and piriformis, quad stab    Consulted and Agree with Plan of Care Patient      Patient will benefit from skilled therapeutic intervention in order to improve the following deficits and impairments:  Postural dysfunction, Decreased strength, Decreased mobility, Improper body mechanics, Impaired flexibility, Pain, Dizziness, Increased fascial restricitons, Difficulty walking, Decreased range of motion  Visit Diagnosis: Chronic bilateral low back pain, with sciatica presence unspecified  Muscle weakness (generalized)  Stiffness of left hip, not elsewhere classified     Problem List Patient Active Problem List   Diagnosis Date Noted  . Back pain 12/10/2015  . Rib pain 12/10/2015  . Osteopenia 08/27/2015  . Type 2 diabetes mellitus without complication ([place]) 09/08/147  . Essential hypertension, benign 09/27/2012  . Hyperlipemia 09/02/2009    Glenford Garis 02/14/2016, 10:19 AM  [place], [place], [address] Phone: [phone]   Fax:  [phone]  Name: Victoria Neal MRN: [id number] Date of Birth: 1955-08-03  Raeford Razor, PT 02/14/16 10:20 AM Phone: [phone] Fax: [phone]

## 2016-02-18 ENCOUNTER — Ambulatory Visit: Payer: BC Managed Care – PPO | Admitting: Physical Therapy

## 2016-02-18 DIAGNOSIS — M25652 Stiffness of left hip, not elsewhere classified: Secondary | ICD-10-CM

## 2016-02-18 DIAGNOSIS — M545 Low back pain: Secondary | ICD-10-CM

## 2016-02-18 DIAGNOSIS — G8929 Other chronic pain: Secondary | ICD-10-CM

## 2016-02-18 DIAGNOSIS — M6281 Muscle weakness (generalized): Secondary | ICD-10-CM

## 2016-02-18 NOTE — Therapy (Signed)
[place], [place], [address] Phone: [phone]   Fax:  [phone]  Physical Therapy Treatment  Patient Details  Name: Victoria Neal MRN: [id number] Date of Birth: 1955/04/17 Referring Provider: Dr. Clearance Coots  Encounter Date: 02/18/2016      PT End of Session - 02/18/16 0955    Visit Number 5   Number of Visits 16   Date for PT Re-Evaluation 03/24/16   PT Start Time 0930   PT Stop Time 1025   PT Time Calculation (min) 55 min   Activity Tolerance Patient tolerated treatment well   Behavior During Therapy [facility] for tasks assessed/performed      Past Medical History:  Diagnosis Date  . Allergic rhinitis   . DM (diabetes mellitus) ([place])   . Glaucoma    ? of, but seeing optho and told recently not glaucoma  . High cholesterol   . HTN (hypertension)   . Internal hemorrhoids   . Osteoporosis    in 2010, has refused bisphosphnate treatment or treament other then vit d and calcium  . Panic anxiety syndrome 01/03/2013  . Pure hyperglyceridemia     Past Surgical History:  Procedure Laterality Date  . COLONOSCOPY  05/03/2006   internal hemorrhoids    There were no vitals filed for this visit.      Subjective Assessment - 02/18/16 0930    Subjective I'm always OK in the AM. Pain was uncomfortable last night, rated 5/10-6/10.     Currently in Pain? Yes   Pain Score 2    Pain Location Back   Pain Orientation Lower   Pain Descriptors / Indicators Discomfort   Pain Type Chronic pain   Pain Radiating Towards goes into my Lt leg at night   Pain Onset More than a month ago   Pain Frequency Intermittent   Aggravating Factors  laying down at night    Pain Relieving Factors heat, stretching                         [facility] Adult PT Treatment/Exercise - 02/18/16 0937      Lumbar Exercises: Stretches   Active Hamstring Stretch 2 reps;30 seconds   Lower Trunk Rotation Limitations  x 10      Lumbar Exercises: Aerobic   Stationary Bike NuStep L5 and UE and LE for 8 min      Lumbar Exercises: Supine   Bridge 10 reps     Lumbar Exercises: Sidelying   Hip Abduction 10 reps   Hip Abduction Weights (lbs) 2 sets    Other Sidelying Lumbar Exercises sidelying QL, L trunk and L hip stretch with manual      Lumbar Exercises: Quadruped   Madcat/Old Horse 10 reps   Single Arm Raise Right;Left;5 reps   Straight Leg Raise 5 reps   Opposite Arm/Leg Raise Right arm/Left leg;Left arm/Right leg;10 reps   Plank sideplank on elbow and legs down (modiified)    Other Quadruped Lumbar Exercises childs pose forward and lateral      Manual Therapy   Manual Therapy Soft tissue mobilization;Myofascial release   Soft tissue mobilization lumbar paraspinals    Myofascial Release low back Lt hip                 PT Education - 02/18/16 0951    Education provided Yes   Education Details general exercises and fatigue , need to raise HR for cardio benefit ,  HEP tips   Person(s) Educated Patient   Methods Explanation;Demonstration;Verbal cues   Comprehension Verbalized understanding;Verbal cues required          PT Short Term Goals - 02/18/16 0946      PT SHORT TERM GOAL #1   Title Pt will be I with initial HEP for LE strength and flexibility to reduce LBP.    Status Achieved     PT SHORT TERM GOAL #2   Title Pt will be able to lie supine at home and in the clinic without increasing low back pain.    Baseline much better but still hurts at night    Status Partially Met     PT SHORT TERM GOAL #3   Title Pt will be able to understand posture, lifting and precautions related to her bone density and demo in clinic.    Status On-going           PT Long Term Goals - 02/18/16 0943      PT LONG TERM GOAL #1   Title Pt will begin walking program for continued fitness, health and wellness.    Baseline gets tired, no pain with walking    Status Achieved     PT LONG  TERM GOAL #2   Title Pt will demo 4+/5 in LE strength to facilitate mobility, safety and gait stability.    Status On-going     PT LONG TERM GOAL #3   Title Pt will be able to report no pain increase with ADLs, housework and care for grandson.    Baseline when lifting anf carrying grandson , has him get on her lap before she lifts and stands up with him    Status Partially Met     PT LONG TERM GOAL #4   Title Pt will be I with more advanced HEP and concepts of posture.    Status On-going               Plan - 02/18/16 0955    Clinical Impression Statement Making progress towards goals.  Does not like vigorous exercise but tires easily. Work towards strengthening hips and core, building endurance.    PT Next Visit Plan Conditioning,  try TM, Quadruped, prone for stab.  manual, modalities L trunk/QL   PT Home Exercise Plan prone press up, hip ext , prone pelvic press , seated ham and piriformis, quad stab    Consulted and Agree with Plan of Care Patient      Patient will benefit from skilled therapeutic intervention in order to improve the following deficits and impairments:  Postural dysfunction, Decreased strength, Decreased mobility, Improper body mechanics, Impaired flexibility, Pain, Dizziness, Increased fascial restricitons, Difficulty walking, Decreased range of motion  Visit Diagnosis: Muscle weakness (generalized)  Chronic bilateral low back pain, with sciatica presence unspecified  Stiffness of left hip, not elsewhere classified     Problem List Patient Active Problem List   Diagnosis Date Noted  . Back pain 12/10/2015  . Rib pain 12/10/2015  . Osteopenia 08/27/2015  . Type 2 diabetes mellitus without complication ([place]) 63/78/5885  . Essential hypertension, benign 09/27/2012  . Hyperlipemia 09/02/2009    PAA,JENNIFER 02/18/2016, 11:34 AM  [facility] [address] [place], [place], [address] Phone:  [phone]   Fax:  [phone]  Name: Victoria Neal MRN: [id number] Date of Birth: Dec 15, 1955  Raeford Razor, PT 02/18/16 11:35 AM Phone: [phone] Fax: [phone]

## 2016-02-18 NOTE — Patient Instructions (Signed)
Gave patient hip Abd in sidelying  from drawer and sidekick series.  Per her request.

## 2016-02-21 ENCOUNTER — Ambulatory Visit: Payer: BC Managed Care – PPO | Admitting: Physical Therapy

## 2016-02-21 DIAGNOSIS — M25652 Stiffness of left hip, not elsewhere classified: Secondary | ICD-10-CM

## 2016-02-21 DIAGNOSIS — G8929 Other chronic pain: Secondary | ICD-10-CM

## 2016-02-21 DIAGNOSIS — M545 Low back pain: Secondary | ICD-10-CM

## 2016-02-21 DIAGNOSIS — M6281 Muscle weakness (generalized): Secondary | ICD-10-CM

## 2016-02-21 NOTE — Patient Instructions (Signed)
Adduction: Hip - Knees Together (Hook-Lying)   Tighten abdominals  Lie with hips and knees bent, towel roll between knees. Push knees together. Hold for __5_ seconds.  Repeat __10_ times. Do _2__ times a day.     External Rotation: Hip - Knees Apart (Hook-Lying)    Lie with hips and knees bent, band tied just above knees. Pull knees apart. Hold for _5__ seconds. Slowly return Repeat _10__ times. Do _2__ times a day.

## 2016-02-21 NOTE — Therapy (Signed)
[place], [place], [address] Phone: [phone]   Fax:  [phone]  Physical Therapy Treatment  Patient Details  Name: Victoria Neal MRN: [id number] Date of Birth: 08-17-1955 Referring Provider: Dr. Clearance Coots  Encounter Date: 02/21/2016      PT End of Session - 02/21/16 0940    Visit Number 6   Number of Visits 16   Date for PT Re-Evaluation 03/24/16   PT Start Time 0934   PT Stop Time 1030   PT Time Calculation (min) 56 min      Past Medical History:  Diagnosis Date  . Allergic rhinitis   . DM (diabetes mellitus) ([place])   . Glaucoma    ? of, but seeing optho and told recently not glaucoma  . High cholesterol   . HTN (hypertension)   . Internal hemorrhoids   . Osteoporosis    in 2010, has refused bisphosphnate treatment or treament other then vit d and calcium  . Panic anxiety syndrome 01/03/2013  . Pure hyperglyceridemia     Past Surgical History:  Procedure Laterality Date  . COLONOSCOPY  05/03/2006   internal hemorrhoids    There were no vitals filed for this visit.      Subjective Assessment - 02/21/16 0937    Subjective I am feeling a little better.    Currently in Pain? Yes   Pain Score 3    Pain Location Back   Pain Orientation Lower                         [facility] Adult PT Treatment/Exercise - 02/21/16 0001      Lumbar Exercises: Stretches   Active Hamstring Stretch 2 reps;30 seconds   Lower Trunk Rotation Limitations x 10      Lumbar Exercises: Aerobic   Stationary Bike NuStep L4 and UE and LE for 5 min   Tread Mill 1.2 mph x 5 minutes  (.09 miles)   SPO2 98% HR 87 bpm     Lumbar Exercises: Supine   Clam 10 reps   Clam Limitations yellow band    Bridge 5 reps   Other Supine Lumbar Exercises ball squeeze x 10 reps      Lumbar Exercises: Quadruped   Madcat/Old Horse 10 reps   Single Arm Raise Right;Left;5 reps   Straight Leg Raise 5 reps    Opposite Arm/Leg Raise Right arm/Left leg;Left arm/Right leg;10 reps     Moist Heat Therapy   Number Minutes Moist Heat 15 Minutes   Moist Heat Location Lumbar Spine                PT Education - 02/21/16 1033    Education provided Yes   Education Details HEP   Person(s) Educated Patient   Methods Explanation;Handout   Comprehension Verbalized understanding          PT Short Term Goals - 02/18/16 0946      PT SHORT TERM GOAL #1   Title Pt will be I with initial HEP for LE strength and flexibility to reduce LBP.    Status Achieved     PT SHORT TERM GOAL #2   Title Pt will be able to lie supine at home and in the clinic without increasing low back pain.    Baseline much better but still hurts at night    Status Partially Met     PT SHORT TERM GOAL #3   Title  Pt will be able to understand posture, lifting and precautions related to her bone density and demo in clinic.    Status On-going           PT Long Term Goals - 02/18/16 0943      PT LONG TERM GOAL #1   Title Pt will begin walking program for continued fitness, health and wellness.    Baseline gets tired, no pain with walking    Status Achieved     PT LONG TERM GOAL #2   Title Pt will demo 4+/5 in LE strength to facilitate mobility, safety and gait stability.    Status On-going     PT LONG TERM GOAL #3   Title Pt will be able to report no pain increase with ADLs, housework and care for grandson.    Baseline when lifting anf carrying grandson , has him get on her lap before she lifts and stands up with him    Status Partially Met     PT LONG TERM GOAL #4   Title Pt will be I with more advanced HEP and concepts of posture.    Status On-going               Plan - 02/21/16 1010    Clinical Impression Statement Trial of T.M for 5 minutes. Pt c/o slight increase in back and left knee pain as well as fatigue. Vitals normal. Left posterior inominate rotation noted. Corrected with MET.  Continued stabilization exercises without increased pain, only fatigue. Pt requested a copy of today's new exercises.    PT Next Visit Plan Conditioning,  try TM, Quadruped, prone for stab.  manual, modalities L trunk/QL   PT Home Exercise Plan prone press up, hip ext , prone pelvic press , seated ham and piriformis, quad stab, ball squeeze, clam with yellow band    Consulted and Agree with Plan of Care Patient      Patient will benefit from skilled therapeutic intervention in order to improve the following deficits and impairments:  Postural dysfunction, Decreased strength, Decreased mobility, Improper body mechanics, Impaired flexibility, Pain, Dizziness, Increased fascial restricitons, Difficulty walking, Decreased range of motion  Visit Diagnosis: Muscle weakness (generalized)  Chronic bilateral low back pain, with sciatica presence unspecified  Stiffness of left hip, not elsewhere classified     Problem List Patient Active Problem List   Diagnosis Date Noted  . Back pain 12/10/2015  . Rib pain 12/10/2015  . Osteopenia 08/27/2015  . Type 2 diabetes mellitus without complication ([place]) 97/41/6384  . Essential hypertension, benign 09/27/2012  . Hyperlipemia 09/02/2009    Dorene Ar , PTA 02/21/2016, 12:11 PM  [facility] [address] [place], [place], [address] Phone: [phone]   Fax:  [phone]  Name: Chaselynn Kepple MRN: [id number] Date of Birth: 1955/05/26

## 2016-02-25 ENCOUNTER — Ambulatory Visit: Payer: BC Managed Care – PPO | Admitting: Physical Therapy

## 2016-02-25 DIAGNOSIS — M545 Low back pain: Secondary | ICD-10-CM | POA: Diagnosis not present

## 2016-02-25 DIAGNOSIS — G8929 Other chronic pain: Secondary | ICD-10-CM

## 2016-02-25 DIAGNOSIS — M6281 Muscle weakness (generalized): Secondary | ICD-10-CM

## 2016-02-25 DIAGNOSIS — M25652 Stiffness of left hip, not elsewhere classified: Secondary | ICD-10-CM

## 2016-02-25 NOTE — Therapy (Addendum)
[place], [place], [address] Phone: [phone]   Fax:  [phone]  Physical Therapy Treatment and Discharge  Patient Details  Name: Victoria Neal MRN: [id number] Date of Birth: 08-16-1955 Referring Provider: Dr. Clearance Coots  Encounter Date: 02/25/2016      PT End of Session - 02/25/16 1116    Visit Number 7   Number of Visits 16   Date for PT Re-Evaluation 03/24/16   PT Start Time 0933   PT Stop Time 1030   PT Time Calculation (min) 57 min      Past Medical History:  Diagnosis Date  . Allergic rhinitis   . DM (diabetes mellitus) ([place])   . Glaucoma    ? of, but seeing optho and told recently not glaucoma  . High cholesterol   . HTN (hypertension)   . Internal hemorrhoids   . Osteoporosis    in 2010, has refused bisphosphnate treatment or treament other then vit d and calcium  . Panic anxiety syndrome 01/03/2013  . Pure hyperglyceridemia     Past Surgical History:  Procedure Laterality Date  . COLONOSCOPY  05/03/2006   internal hemorrhoids    There were no vitals filed for this visit.      Subjective Assessment - 02/25/16 0937    Subjective I have tightness in my lower back today. I felt fine after last treatment and the treadmill was fine.    Currently in Pain? Yes   Pain Score 4    Pain Location Back   Pain Orientation Lower   Pain Descriptors / Indicators Tightness   Aggravating Factors  sitting on couch, prolonged sitting    Pain Relieving Factors heat, stretching                          [facility] Adult PT Treatment/Exercise - 02/25/16 0001      Lumbar Exercises: Stretches   Prone Mid Back Stretch Limitations childs pose 1 minute then laterals      Lumbar Exercises: Aerobic   Stationary Bike NuStep L5 and UE and LE for 5 min    Tread Mill 1.51mh x 7 minutes      Lumbar Exercises: Supine   Clam 20 reps   Clam Limitations red band    Other Supine  Lumbar Exercises ball squeeze x 10 reps      Lumbar Exercises: Quadruped   Madcat/Old Horse 10 reps   Opposite Arm/Leg Raise 5 reps;5 seconds     Moist Heat Therapy   Number Minutes Moist Heat 15 Minutes   Moist Heat Location Lumbar Spine  prone     Manual Therapy   Soft tissue mobilization lumbar paraspinals   pain with palaption to L-5 Transverse processes                  PT Short Term Goals - 02/18/16 0946      PT SHORT TERM GOAL #1   Title Pt will be I with initial HEP for LE strength and flexibility to reduce LBP.    Status Achieved     PT SHORT TERM GOAL #2   Title Pt will be able to lie supine at home and in the clinic without increasing low back pain.    Baseline much better but still hurts at night    Status Partially Met     PT SHORT TERM GOAL #3   Title Pt will be able to understand  posture, lifting and precautions related to her bone density and demo in clinic.    Status On-going           PT Long Term Goals - 02/18/16 0943      PT LONG TERM GOAL #1   Title Pt will begin walking program for continued fitness, health and wellness.    Baseline gets tired, no pain with walking    Status Achieved     PT LONG TERM GOAL #2   Title Pt will demo 4+/5 in LE strength to facilitate mobility, safety and gait stability.    Status On-going     PT LONG TERM GOAL #3   Title Pt will be able to report no pain increase with ADLs, housework and care for grandson.    Baseline when lifting anf carrying grandson , has him get on her lap before she lifts and stands up with him    Status Partially Met     PT LONG TERM GOAL #4   Title Pt will be I with more advanced HEP and concepts of posture.    Status On-going               Plan - 02/25/16 4098    Clinical Impression Statement Pt c/o increased lumbar tightness today. She is consistent with HEP. She reports inability to lie supine without pain unless feet are elvevated. She is tender to palpation at  L5 transverse processes and feels these bones are "rubbing"; she may benfit from lumbar mobs. Soft tissue work to lumbar paraspinals followed by HMP to decrease tightness. She tolerated increased time on the treatmill today.    PT Next Visit Plan Try mobs to lower lumbar (pt tender to L5 transverse processes. Conditioning,  try TM, Quadruped, prone for stab.  manual, modalities L trunk/QL   PT Home Exercise Plan prone press up, hip ext , prone pelvic press , seated ham and piriformis, quad stab, ball squeeze, clam with yellow band    Consulted and Agree with Plan of Care Patient      Patient will benefit from skilled therapeutic intervention in order to improve the following deficits and impairments:  Postural dysfunction, Decreased strength, Decreased mobility, Improper body mechanics, Impaired flexibility, Pain, Dizziness, Increased fascial restricitons, Difficulty walking, Decreased range of motion  Visit Diagnosis: Muscle weakness (generalized)  Chronic bilateral low back pain, with sciatica presence unspecified  Stiffness of left hip, not elsewhere classified     Problem List Patient Active Problem List   Diagnosis Date Noted  . Back pain 12/10/2015  . Rib pain 12/10/2015  . Osteopenia 08/27/2015  . Type 2 diabetes mellitus without complication ([place]) 11/91/4782  . Essential hypertension, benign 09/27/2012  . Hyperlipemia 09/02/2009    Dorene Ar, PTA 02/25/2016, 11:18 AM  [facility] [address] [place], [place], [address] Phone: [phone]   Fax:  [phone]  Name: Victoria Neal MRN: [id number] Date of Birth: June 24, 1955  PHYSICAL THERAPY DISCHARGE SUMMARY  Visits from Start of Care: 7  Current functional level related to goals / functional outcomes: See above for most recent info    Remaining deficits: Unknown, has not returned   Education / Equipment: HEP, posture, body mechanics   Plan: Patient agrees to discharge.  Patient goals were partially met. Patient is being discharged due to not returning since the last visit.  ?????    Raeford Razor, PT 06/17/16 1:15 PM Phone: [phone] Fax: [phone]

## 2016-02-28 ENCOUNTER — Ambulatory Visit: Payer: BC Managed Care – PPO | Admitting: Physical Therapy

## 2016-03-26 ENCOUNTER — Other Ambulatory Visit: Payer: Self-pay | Admitting: Family Medicine

## 2016-03-26 DIAGNOSIS — Z1231 Encounter for screening mammogram for malignant neoplasm of breast: Secondary | ICD-10-CM

## 2016-04-21 ENCOUNTER — Encounter: Payer: Self-pay | Admitting: Family Medicine

## 2016-04-21 ENCOUNTER — Ambulatory Visit (INDEPENDENT_AMBULATORY_CARE_PROVIDER_SITE_OTHER): Payer: BC Managed Care – PPO | Admitting: Family Medicine

## 2016-04-21 VITALS — BP 118/60 | HR 67 | Temp 97.8°F | Ht 63.0 in | Wt 123.3 lb

## 2016-04-21 DIAGNOSIS — E119 Type 2 diabetes mellitus without complications: Secondary | ICD-10-CM | POA: Diagnosis not present

## 2016-04-21 DIAGNOSIS — I10 Essential (primary) hypertension: Secondary | ICD-10-CM

## 2016-04-21 DIAGNOSIS — E785 Hyperlipidemia, unspecified: Secondary | ICD-10-CM | POA: Diagnosis not present

## 2016-04-21 LAB — CBC
HEMATOCRIT: 40.8 % (ref 36.0–46.0)
Hemoglobin: 13.4 g/dL (ref 12.0–15.0)
MCHC: 32.8 g/dL (ref 30.0–36.0)
MCV: 87.8 fl (ref 78.0–100.0)
Platelets: 253 10*3/uL (ref 150.0–400.0)
RBC: 4.64 Mil/uL (ref 3.87–5.11)
RDW: 13.7 % (ref 11.5–15.5)
WBC: 4.3 10*3/uL (ref 4.0–10.5)

## 2016-04-21 LAB — BASIC METABOLIC PANEL
BUN: 14 mg/dL (ref 6–23)
CO2: 28 meq/L (ref 19–32)
Calcium: 9.5 mg/dL (ref 8.4–10.5)
Chloride: 105 mEq/L (ref 96–112)
Creatinine, Ser: 0.66 mg/dL (ref 0.40–1.20)
GFR: 96.34 mL/min (ref >60.00)
GLUCOSE: 110 mg/dL — AB (ref 70–99)
POTASSIUM: 4.1 meq/L (ref 3.5–5.1)
SODIUM: 141 meq/L (ref 135–145)

## 2016-04-21 LAB — HEMOGLOBIN A1C: Hgb A1c MFr Bld: 6.4 % (ref 4.6–6.5)

## 2016-04-21 MED ORDER — ROSUVASTATIN CALCIUM 10 MG PO TABS: 10.0000 mg | ORAL_TABLET | Freq: Every day | ORAL | 3 refills | Status: DC

## 2016-04-21 NOTE — Progress Notes (Signed)
Pre visit review using our clinic review tool, if applicable. No additional management support is needed unless otherwise documented below in the visit note. 

## 2016-04-21 NOTE — Progress Notes (Signed)
HPI:  Victoria Neal is a pleasant 61 y.o. here for follow up. Chronic medical problems summarized below were reviewed for changes and stability and were updated as needed below. These issues and their treatment remain stable for the most part. Reports she is doing great. Feels good. Trying to ear healthy and remain active. Will go to [place] for a few months with her daughter this summer.. Denies CP, SOB, DOE, treatment intolerance or new symptoms. Due for labs, diabetic eye exam, foot exam according to chart. Report eye exam done.  Diabetes Mellitus: -meds: 500mg  bid metformin XR, cinnamon -home BS: good -diet and exercise: -last eye exam: Goes yearly  HLD: -meds: crestor advised, fish oil -stable  HTN: -meds: norvasc 5 mg and losartan 25mg  daily  Chronic low back pain: -> 1 year, intermittent -yoga was helpful in the past, seeing specialist and doing PT -no symptoms now  ROS: See pertinent positives and negatives per HPI.  Past Medical History:  Diagnosis Date  . Allergic rhinitis   . DM (diabetes mellitus) ([place])   . Glaucoma    ? of, but seeing optho and told recently not glaucoma  . High cholesterol   . HTN (hypertension)   . Internal hemorrhoids   . Osteoporosis    in 2010, has refused bisphosphnate treatment or treament other then vit d and calcium  . Panic anxiety syndrome 01/03/2013  . Pure hyperglyceridemia     Past Surgical History:  Procedure Laterality Date  . COLONOSCOPY  05/03/2006   internal hemorrhoids    Family History  Problem Relation Age of Onset  . Coronary artery disease Maternal Uncle   . Lung cancer Sister   . Cancer Sister     lung   . Breast cancer Maternal Aunt   . Colon cancer Mother     deceased age 83  . Cancer Mother 45  . Hypertension Mother   . Diabetes Father   . Hypertension Father   . Diabetes Paternal Aunt     Social History   Social History  . Marital status: Married    Spouse name: N/A  . Number of  children: N/A  . Years of education: N/A   Social History Main Topics  . Smoking status: Never Smoker  . Smokeless tobacco: Never Used  . Alcohol use Yes     Comment: occ - very rare  . Drug use: No  . Sexual activity: Not Asked   Other Topics Concern  . 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 [employer] Situation: lives with husband and son - has 4 children, son in Facilities manager school, son animation, daughter is Pension scheme manager      Spiritual Beliefs: Baha'i faith - all religions are one, prayer based, no health care restrictions      Lifestyle: no CV exercise; diet is good              Current Outpatient Prescriptions:  .  amLODipine (NORVASC) 5 MG tablet, TAKE ONE TABLET BY MOUTH ONCE DAILY, Disp: 90 tablet, Rfl: 3 .  FREESTYLE LITE test strip, , Disp: , Rfl:  .  Lancets (FREESTYLE) lancets, , Disp: , Rfl:  .  losartan (COZAAR) 25 MG tablet, TAKE ONE TABLET BY MOUTH ONCE DAILY, Disp: 90 tablet, Rfl: 3 .  metFORMIN (GLUCOPHAGE) 1000 MG tablet, TAKE ONE TABLET BY MOUTH TWICE DAILY WITH A MEAL, Disp: 180 tablet, Rfl: 1 .  rosuvastatin (CRESTOR)  10 MG tablet, Take 1 tablet (10 mg total) by mouth daily., Disp: 90 tablet, Rfl: 3 .  Specialty Vitamins Products (ONE-A-DAY BONE STRENGTH PO), Take 1 tablet by mouth daily. , Disp: , Rfl:   EXAM:  Vitals:   04/21/16 0940  BP: 118/60  Pulse: 67  Temp: 97.8 F (36.6 C)    Body mass index is 21.84 kg/m.  GENERAL: vitals reviewed and listed above, alert, oriented, appears well hydrated and in no acute distress  HEENT: atraumatic, conjunttiva clear, no obvious abnormalities on inspection of external nose and ears  NECK: no obvious masses on inspection  LUNGS: clear to auscultation bilaterally, no wheezes, rales or rhonchi, good air movement  CV: HRRR, no peripheral edema  MS: moves all extremities without noticeable abnormality  PSYCH: pleasant and cooperative, no obvious depression or  anxiety  ASSESSMENT AND PLAN:  Discussed the following assessment and plan:  Type 2 diabetes mellitus without complication, without long-term current use of insulin (HCC) - Plan: Hemoglobin A1c  Essential hypertension, benign - Plan: Basic metabolic panel, CBC  Hyperlipidemia, unspecified hyperlipidemia type  -foot exam done -assistant advised to obtain eye report -lifestyle recs -labs today -Patient advised to return or notify a doctor immediately if symptoms worsen or persist or new concerns arise.  Patient Instructions  BEFORE YOU LEAVE: -follow up: 3-4 months -labs  We have ordered labs or studies at this visit. It can take up to 1-2 weeks for results and processing. IF results require follow up or explanation, we will call you with instructions. Clinically stable results will be released to your [facility]. If you have not heard from [place] or cannot find your results in [facility] in 2 weeks please contact our office at [phone].  If you are not yet signed up for [facility], please consider signing up.   We recommend the following healthy lifestyle for LIFE:   Eat a healthy clean diet.  * Tip: Avoid (less then 1 serving per week): processed foods, sweets, sweetened drinks, white starches (rice, flour, bread, potatoes, pasta, etc), red meat, fast foods, butter  *Tip: CHOOSE instead   * 5-9 servings per day of fresh or frozen fruits and vegetables (but not corn, potatoes, bananas, canned or dried fruit)   *nuts and seeds, beans   *olives and olive oil   *small portions of lean meats such as fish and white chicken    *small portions of whole grains  Get at least 150 minutes of sweaty aerobic exercise per week.  Reduce stress - consider counseling, meditation and relaxation to balance other aspects of your life.  WE NOW OFFER    [facility] FAST TRACK!!!  SAME DAY Appointments for ACUTE CARE  Such as: Sprains, Injuries, cuts, abrasions, rashes, muscle pain, joint  pain, back pain Colds, flu, sore throats, headache, allergies, cough, fever  Ear pain, sinus and eye infections Abdominal pain, nausea, vomiting, diarrhea, upset stomach Animal/insect bites  3 Easy Ways to Schedule: Walk-In Scheduling Call in scheduling Mychart Sign-up: [email or web address]                Colin Benton R., DO

## 2016-04-21 NOTE — Patient Instructions (Signed)
BEFORE YOU LEAVE: -follow up: 3-4 months -labs  We have ordered labs or studies at this visit. It can take up to 1-2 weeks for results and processing. IF results require follow up or explanation, we will call you with instructions. Clinically stable results will be released to your [facility]. If you have not heard from [place] or cannot find your results in [facility] in 2 weeks please contact our office at [phone].  If you are not yet signed up for [facility], please consider signing up.   We recommend the following healthy lifestyle for LIFE:   Eat a healthy clean diet.  * Tip: Avoid (less then 1 serving per week): processed foods, sweets, sweetened drinks, white starches (rice, flour, bread, potatoes, pasta, etc), red meat, fast foods, butter  *Tip: CHOOSE instead   * 5-9 servings per day of fresh or frozen fruits and vegetables (but not corn, potatoes, bananas, canned or dried fruit)   *nuts and seeds, beans   *olives and olive oil   *small portions of lean meats such as fish and white chicken    *small portions of whole grains  Get at least 150 minutes of sweaty aerobic exercise per week.  Reduce stress - consider counseling, meditation and relaxation to balance other aspects of your life.  WE NOW OFFER   [facility] FAST TRACK!!!  SAME DAY Appointments for ACUTE CARE  Such as: Sprains, Injuries, cuts, abrasions, rashes, muscle pain, joint pain, back pain Colds, flu, sore throats, headache, allergies, cough, fever  Ear pain, sinus and eye infections Abdominal pain, nausea, vomiting, diarrhea, upset stomach Animal/insect bites  3 Easy Ways to Schedule: Walk-In Scheduling Call in scheduling Mychart Sign-up: [email or web address]

## 2016-04-28 LAB — HM DIABETES EYE EXAM

## 2016-04-29 ENCOUNTER — Ambulatory Visit
Admission: RE | Admit: 2016-04-29 | Discharge: 2016-04-29 | Disposition: A | Discharge status: Home / Self Care | Payer: BC Managed Care – PPO | Source: Ambulatory Visit | Attending: Family Medicine | Admitting: Family Medicine

## 2016-04-29 DIAGNOSIS — Z1231 Encounter for screening mammogram for malignant neoplasm of breast: Secondary | ICD-10-CM

## 2016-05-01 ENCOUNTER — Encounter: Payer: Self-pay | Admitting: Family Medicine

## 2016-06-02 ENCOUNTER — Encounter: Payer: Self-pay | Admitting: Internal Medicine

## 2016-07-28 ENCOUNTER — Other Ambulatory Visit: Payer: Self-pay | Admitting: Family Medicine

## 2016-08-06 NOTE — Progress Notes (Deleted)
HPI:  Victoria Neal is a pleasant 60 y.o. here for follow up. Chronic medical problems summarized below were reviewed for changes. ***. Denies CP, SOB, DOE, treatment intolerance or new symptoms. Due for labs, CPE next visit  Diabetes Mellitus: -meds: 500mg  bid metformin XR, cinnamon -home BS: good -diet and exercise: -last eye exam: Goes yearly  HLD: -meds: crestor advised, fish oil -stable  HTN: -meds: norvasc 5 mg and losartan 25mg  daily  Chronic low back pain: -> 1 year, intermittent -yoga was helpful in the past, seeing specialist and doing PT -no symptoms now   ROS: See pertinent positives and negatives per HPI.  Past Medical History:  Diagnosis Date  . Allergic rhinitis   . DM (diabetes mellitus) ([place])   . Glaucoma    ? of, but seeing optho and told recently not glaucoma  . High cholesterol   . HTN (hypertension)   . Internal hemorrhoids   . Osteoporosis    in 2010, has refused bisphosphnate treatment or treament other then vit d and calcium  . Panic anxiety syndrome 01/03/2013  . Pure hyperglyceridemia     Past Surgical History:  Procedure Laterality Date  . COLONOSCOPY  05/03/2006   internal hemorrhoids    Family History  Problem Relation Age of Onset  . Coronary artery disease Maternal Uncle   . Lung cancer Sister   . Cancer Sister        lung   . Breast cancer Maternal Aunt   . Colon cancer Mother        deceased age 76  . Cancer Mother 27  . Hypertension Mother   . Diabetes Father   . Hypertension Father   . Diabetes Paternal Aunt     Social History   Social History  . Marital status: Married    Spouse name: N/A  . Number of children: N/A  . Years of education: N/A   Social History Main Topics  . Smoking status: Never Smoker  . Smokeless tobacco: Never Used  . Alcohol use Yes     Comment: occ - very rare  . Drug use: No  . Sexual activity: Not on file   Other Topics Concern  . Not on file   Social History Narrative    Work or School: 2nd grade at [employer] Situation: lives with husband and son - has 4 children, son in Facilities manager school, son animation, daughter is Pension scheme manager      Spiritual Beliefs: Baha'i faith - all religions are one, prayer based, no health care restrictions      Lifestyle: no CV exercise; diet is good              Current Outpatient Prescriptions:  .  amLODipine (NORVASC) 5 MG tablet, TAKE ONE TABLET BY MOUTH ONCE DAILY, Disp: 90 tablet, Rfl: 3 .  FREESTYLE LITE test strip, , Disp: , Rfl:  .  Lancets (FREESTYLE) lancets, , Disp: , Rfl:  .  losartan (COZAAR) 25 MG tablet, TAKE ONE TABLET BY MOUTH ONCE DAILY, Disp: 90 tablet, Rfl: 3 .  metFORMIN (GLUCOPHAGE) 1000 MG tablet, TAKE ONE TABLET BY MOUTH TWICE DAILY WITH A MEAL, Disp: 180 tablet, Rfl: 0 .  rosuvastatin (CRESTOR) 10 MG tablet, Take 1 tablet (10 mg total) by mouth daily., Disp: 90 tablet, Rfl: 3 .  Specialty Vitamins Products (ONE-A-DAY BONE STRENGTH PO), Take 1 tablet by mouth daily. , Disp: , Rfl:   EXAM:  There were no vitals  filed for this visit.  There is no height or weight on file to calculate BMI.  GENERAL: vitals reviewed and listed above, alert, oriented, appears well hydrated and in no acute distress  HEENT: atraumatic, conjunttiva clear, no obvious abnormalities on inspection of external nose and ears  NECK: no obvious masses on inspection  LUNGS: clear to auscultation bilaterally, no wheezes, rales or rhonchi, good air movement  CV: HRRR, no peripheral edema  MS: moves all extremities without noticeable abnormality  PSYCH: pleasant and cooperative, no obvious depression or anxiety  ASSESSMENT AND PLAN:  Discussed the following assessment and plan:  No diagnosis found.  -Patient advised to return or notify a doctor immediately if symptoms worsen or persist or new concerns arise.  There are no Patient Instructions on file for this visit.  Colin Benton R., DO

## 2016-08-07 ENCOUNTER — Encounter: Payer: Self-pay | Admitting: Family Medicine

## 2016-08-07 ENCOUNTER — Ambulatory Visit: Payer: BC Managed Care – PPO | Admitting: Family Medicine

## 2016-08-07 ENCOUNTER — Other Ambulatory Visit: Payer: Self-pay | Admitting: *Deleted

## 2016-08-07 ENCOUNTER — Ambulatory Visit (INDEPENDENT_AMBULATORY_CARE_PROVIDER_SITE_OTHER): Payer: BC Managed Care – PPO | Admitting: Family Medicine

## 2016-08-07 DIAGNOSIS — E785 Hyperlipidemia, unspecified: Secondary | ICD-10-CM

## 2016-08-07 DIAGNOSIS — E119 Type 2 diabetes mellitus without complications: Secondary | ICD-10-CM

## 2016-08-07 DIAGNOSIS — I10 Essential (primary) hypertension: Secondary | ICD-10-CM

## 2016-08-07 LAB — BASIC METABOLIC PANEL
BUN: 9 mg/dL (ref 6–23)
CHLORIDE: 102 meq/L (ref 96–112)
CO2: 32 meq/L (ref 19–32)
CREATININE: 0.75 mg/dL (ref 0.40–1.20)
Calcium: 9.5 mg/dL (ref 8.4–10.5)
GFR: 83.04 mL/min (ref >60.00)
GLUCOSE: 103 mg/dL — AB (ref 70–99)
POTASSIUM: 4.1 meq/L (ref 3.5–5.1)
Sodium: 142 mEq/L (ref 135–145)

## 2016-08-07 LAB — CBC WITH DIFFERENTIAL/PLATELET
BASOS PCT: 0.9 % (ref 0.0–3.0)
Basophils Absolute: 0 10*3/uL (ref 0.0–0.1)
EOS PCT: 2 % (ref 0.0–5.0)
Eosinophils Absolute: 0.1 10*3/uL (ref 0.0–0.7)
HEMATOCRIT: 40.4 % (ref 36.0–46.0)
HEMOGLOBIN: 13.6 g/dL (ref 12.0–15.0)
LYMPHS PCT: 40.5 % (ref 12.0–46.0)
Lymphs Abs: 1.7 10*3/uL (ref 0.7–4.0)
MCHC: 33.6 g/dL (ref 30.0–36.0)
MCV: 87 fl (ref 78.0–100.0)
MONOS PCT: 7.6 % (ref 3.0–12.0)
Monocytes Absolute: 0.3 10*3/uL (ref 0.1–1.0)
Neutro Abs: 2.1 10*3/uL (ref 1.4–7.7)
Neutrophils Relative %: 49 % (ref 43.0–77.0)
Platelets: 250 10*3/uL (ref 150.0–400.0)
RBC: 4.64 Mil/uL (ref 3.87–5.11)
RDW: 13.4 % (ref 11.5–15.5)
WBC: 4.3 10*3/uL (ref 4.0–10.5)

## 2016-08-07 LAB — LIPID PANEL
CHOLESTEROL: 139 mg/dL (ref 0–200)
HDL: 71.5 mg/dL (ref >39.00)
LDL Cholesterol: 35 mg/dL (ref 0–99)
NONHDL: 67.21
Total CHOL/HDL Ratio: 2
Triglycerides: 161 mg/dL — ABNORMAL HIGH (ref 0.0–149.0)
VLDL: 32.2 mg/dL (ref 0.0–40.0)

## 2016-08-07 LAB — HEMOGLOBIN A1C: HEMOGLOBIN A1C: 6.4 % (ref 4.6–6.5)

## 2016-08-07 MED ORDER — METFORMIN HCL 500 MG PO TABS
1000.0000 mg | ORAL_TABLET | Freq: Two times a day (BID) | ORAL | 3 refills | Status: DC
Start: 2016-08-07 — End: 2017-11-30

## 2016-08-07 NOTE — Patient Instructions (Signed)
BEFORE YOU LEAVE: -follow up: 3-4 months  Decrease Metformin to 500mg  twice daily  Eat a healthy diet and get regular exercise - try to decrease sugary fruits and sugar in diet.   We recommend the following healthy lifestyle for LIFE: 1) Small portions.   Tip: eat off of a salad plate instead of a dinner plate.  Tip: It is ok to feel hungry after a meal - that likely means you ate an appropriate portion.  Tip: if you need more or a snack choose veggies and/or a handful of nuts or seeds.  2) Eat a healthy clean diet.   TRY TO EAT: -at least 5-7 servings of low sugar vegetables per day (not corn, potatoes or bananas.) -berries are the best choice if you wish to eat fruit.   -lean meets (fish, chicken or [place] breasts) -vegan proteins for some meals - beans or tofu, whole grains, nuts and seeds -Replace bad fats with good fats - good fats include: fish, nuts and seeds, canola oil, olive oil -small amounts of low fat or non fat dairy -small amounts of100 % whole grains - check the lables  AVOID: -SUGAR, sweets, anything with added sugar, corn syrup or sweeteners -if you must have a sweetener, small amounts of stevia may be best -sweetened beverages -simple starches (rice, bread, potatoes, pasta, chips, etc - small amounts of 100% whole grains are ok) -red meat, pork, butter -fried foods, fast food, processed food, excessive dairy, eggs and coconut.  3)Get at least 150 minutes of sweaty aerobic exercise per week.  4)Reduce stress - consider counseling, meditation and relaxation to balance other aspects of your life.

## 2016-08-07 NOTE — Progress Notes (Signed)
HPI:  Follow up DM, HTN, HLD. Reports is doing well for the most part but feel the increased dose of metformin is upsetting her stomach and wants to go back down to 500 mg twice daily. She would like to work on lifestyle rather than adding another medication. She had her labs earlier today and these actually looked okay with a hemoglobin A1c of 6.4. Her lipid panel looked okay except for her triglycerides were very mildly elevated. BMP and CBC were also okay. She denies chest pain, fevers, low blood sugars or high blood sugars.  ROS: See pertinent positives and negatives per HPI.  Past Medical History:  Diagnosis Date  . Allergic rhinitis   . DM (diabetes mellitus) ([place])   . Glaucoma    ? of, but seeing optho and told recently not glaucoma  . High cholesterol   . HTN (hypertension)   . Internal hemorrhoids   . Osteoporosis    in 2010, has refused bisphosphnate treatment or treament other then vit d and calcium  . Panic anxiety syndrome 01/03/2013  . Pure hyperglyceridemia     Past Surgical History:  Procedure Laterality Date  . COLONOSCOPY  05/03/2006   internal hemorrhoids    Family History  Problem Relation Age of Onset  . Coronary artery disease Maternal Uncle   . Lung cancer Sister   . Cancer Sister        lung   . Breast cancer Maternal Aunt   . Colon cancer Mother        deceased age 75  . Cancer Mother 42  . Hypertension Mother   . Diabetes Father   . Hypertension Father   . Diabetes Paternal Aunt     Social History   Social History  . Marital status: Married    Spouse name: N/A  . Number of children: N/A  . Years of education: N/A   Social History Main Topics  . Smoking status: Never Smoker  . Smokeless tobacco: Never Used  . Alcohol use Yes     Comment: occ - very rare  . Drug use: No  . Sexual activity: Not Asked   Other Topics Concern  . 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 [employer] Situation:  lives with husband and son - has 4 children, son in Facilities manager school, son animation, daughter is Pension scheme manager      Spiritual Beliefs: Baha'i faith - all religions are one, prayer based, no health care restrictions      Lifestyle: no CV exercise; diet is good              Current Outpatient Prescriptions:  .  amLODipine (NORVASC) 5 MG tablet, TAKE ONE TABLET BY MOUTH ONCE DAILY, Disp: 90 tablet, Rfl: 3 .  FREESTYLE LITE test strip, , Disp: , Rfl:  .  Lancets (FREESTYLE) lancets, , Disp: , Rfl:  .  losartan (COZAAR) 25 MG tablet, TAKE ONE TABLET BY MOUTH ONCE DAILY, Disp: 90 tablet, Rfl: 3 .  metFORMIN (GLUCOPHAGE) 500 MG tablet, Take 2 tablets (1,000 mg total) by mouth 2 (two) times daily with a meal., Disp: 180 tablet, Rfl: 3 .  rosuvastatin (CRESTOR) 10 MG tablet, Take 1 tablet (10 mg total) by mouth daily., Disp: 90 tablet, Rfl: 3 .  Specialty Vitamins Products (ONE-A-DAY BONE STRENGTH PO), Take 1 tablet by mouth daily. , Disp: , Rfl:   EXAM:  Vitals:   08/07/16 1552  BP: 102/80  Pulse: 75  Temp: 98.4 F (36.9 C)    Body mass index is 21.7 kg/m.  GENERAL: vitals reviewed and listed above, alert, oriented, appears well hydrated and in no acute distress  HEENT: atraumatic, conjunttiva clear, no obvious abnormalities on inspection of external nose and ears  NECK: no obvious masses on inspection  LUNGS: clear to auscultation bilaterally, no wheezes, rales or rhonchi, good air movement  CV: HRRR, no peripheral edema  MS: moves all extremities without noticeable abnormality  PSYCH: pleasant and cooperative, no obvious depression or anxiety  ASSESSMENT AND PLAN:  Discussed the following assessment and plan:  Type 2 diabetes mellitus without complication, without long-term current use of insulin (HCC) - Plan: Hemoglobin A1c, metFORMIN (GLUCOPHAGE) 500 MG tablet  Hyperlipidemia, unspecified hyperlipidemia type - Plan: Lipid panel  Essential hypertension, benign - Plan: CBC with  Differential/Platelet, Basic Metabolic Panel  -Per her preference we will try cutting back on the metformin to 500 mg twice a day -In the interim she will work on eating a lower sugar diet and exercise, it seems she has been eating a lot of melons and bananas recently and cutting back on these may be enough to balance out the decrease in metformin -We will have her follow-up in about 3-4 months and we'll recheck her labs then -Patient advised to return or notify a doctor immediately if symptoms worsen or persist or new concerns arise.  Patient Instructions  BEFORE YOU LEAVE: -follow up: 3-4 months  Decrease Metformin to 500mg  twice daily  Eat a healthy diet and get regular exercise - try to decrease sugary fruits and sugar in diet.   We recommend the following healthy lifestyle for LIFE: 1) Small portions.   Tip: eat off of a salad plate instead of a dinner plate.  Tip: It is ok to feel hungry after a meal - that likely means you ate an appropriate portion.  Tip: if you need more or a snack choose veggies and/or a handful of nuts or seeds.  2) Eat a healthy clean diet.   TRY TO EAT: -at least 5-7 servings of low sugar vegetables per day (not corn, potatoes or bananas.) -berries are the best choice if you wish to eat fruit.   -lean meets (fish, chicken or [place] breasts) -vegan proteins for some meals - beans or tofu, whole grains, nuts and seeds -Replace bad fats with good fats - good fats include: fish, nuts and seeds, canola oil, olive oil -small amounts of low fat or non fat dairy -small amounts of100 % whole grains - check the lables  AVOID: -SUGAR, sweets, anything with added sugar, corn syrup or sweeteners -if you must have a sweetener, small amounts of stevia may be best -sweetened beverages -simple starches (rice, bread, potatoes, pasta, chips, etc - small amounts of 100% whole grains are ok) -red meat, pork, butter -fried foods, fast food, processed food, excessive  dairy, eggs and coconut.  3)Get at least 150 minutes of sweaty aerobic exercise per week.  4)Reduce stress - consider counseling, meditation and relaxation to balance other aspects of your life.     Colin Benton R., DO

## 2016-09-08 ENCOUNTER — Encounter: Payer: Self-pay | Admitting: Internal Medicine

## 2016-09-24 ENCOUNTER — Encounter: Payer: Self-pay | Admitting: Family Medicine

## 2016-10-12 ENCOUNTER — Other Ambulatory Visit: Payer: Self-pay | Admitting: Family Medicine

## 2016-11-13 ENCOUNTER — Encounter: Payer: BC Managed Care – PPO | Admitting: Internal Medicine

## 2016-11-27 ENCOUNTER — Other Ambulatory Visit: Payer: Self-pay | Admitting: Family Medicine

## 2016-11-29 NOTE — Progress Notes (Signed)
HPI:  Victoria Neal is a pleasant 61 y.o. here for follow up. Chronic medical problems summarized below were reviewed for changes.  Doing well for the most part.  No concerns, except a itchy rash on her trunk.  This started over a week ago.  She cannot think of any new changes in her regimen.  She did try a different soap and thinks this may be caused it, but now has switched back to her other soap which is asked.  No new medications or supplements.  No fevers, malaise, headache, feeling sick or any other symptoms.  Denies CP, SOB, DOE, treatment intolerance or new symptoms.  Due for labs, flu shot Refuses flu shot HTN: -meds: amlodipine 5, losartan 25  DM: -meds:metformin  HLD: -meds: crestor 10mg   ROS: See pertinent positives and negatives per HPI.  Past Medical History:  Diagnosis Date  . Allergic rhinitis   . DM (diabetes mellitus) ([name])   . Glaucoma    ? of, but seeing optho and told recently not glaucoma  . High cholesterol   . HTN (hypertension)   . Internal hemorrhoids   . Osteoporosis    in 2010, has refused bisphosphnate treatment or treament other then vit d and calcium  . Panic anxiety syndrome 01/03/2013  . Pure hyperglyceridemia     Past Surgical History:  Procedure Laterality Date  . COLONOSCOPY  05/03/2006   internal hemorrhoids    Family History  Problem Relation Age of Onset  . Coronary artery disease Maternal Uncle   . Lung cancer Sister   . Cancer Sister        lung   . Breast cancer Maternal Aunt   . Colon cancer Mother        deceased age 34  . Cancer Mother 6  . Hypertension Mother   . Diabetes Father   . Hypertension Father   . Diabetes Paternal Aunt     Social History   Socioeconomic History  . Marital status: Married    Spouse name: None  . Number of children: None  . Years of education: None  . Highest education level: None  Social Needs  . Financial resource strain: None  . Food insecurity - worry: None  . Food  insecurity - inability: None  . Transportation needs - medical: None  . Transportation needs - non-medical: None  Occupational History  . None  Tobacco Use  . Smoking status: Never Smoker  . Smokeless tobacco: Never Used  Substance and Sexual Activity  . Alcohol use: Yes    Comment: occ - very rare  . Drug use: No  . Sexual activity: None  Other Topics Concern  . None  Social History Narrative   Work or School: 2nd grade at [employer] Situation: lives with husband and son - has 4 children, son in Facilities manager school, son animation, daughter is Pension scheme manager      Spiritual Beliefs: Baha'i faith - all religions are one, prayer based, no health care restrictions      Lifestyle: no CV exercise; diet is good              Current Outpatient Medications:  .  amLODipine (NORVASC) 5 MG tablet, TAKE ONE TABLET BY MOUTH ONCE DAILY., Disp: 90 tablet, Rfl: 1 .  FREESTYLE LITE test strip, , Disp: , Rfl:  .  Lancets (FREESTYLE) lancets, , Disp: , Rfl:  .  losartan (COZAAR) 25 MG tablet, TAKE ONE TABLET BY MOUTH  ONCE DAILY, Disp: 90 tablet, Rfl: 1 .  metFORMIN (GLUCOPHAGE) 500 MG tablet, Take 2 tablets (1,000 mg total) by mouth 2 (two) times daily with a meal., Disp: 180 tablet, Rfl: 3 .  rosuvastatin (CRESTOR) 10 MG tablet, Take 1 tablet (10 mg total) by mouth daily., Disp: 90 tablet, Rfl: 3 .  Specialty Vitamins Products (ONE-A-DAY BONE STRENGTH PO), Take 1 tablet by mouth daily. , Disp: , Rfl:  .  triamcinolone cream (KENALOG) 0.1 %, Apply 1 application topically 2 (two) times daily., Disp: 30 g, Rfl: 0  EXAM:  Vitals:   12/01/16 0918  BP: 120/70  Pulse: 62  Temp: 98.2 F (36.8 C)    Body mass index is 21.82 kg/m.  GENERAL: vitals reviewed and listed above, alert, oriented, appears well hydrated and in no acute distress  HEENT: atraumatic, conjunttiva clear, no obvious abnormalities on inspection of external nose and ears  NECK: no obvious masses on inspection  LUNGS:  clear to auscultation bilaterally, no wheezes, rales or rhonchi, good air movement  CV: HRRR, no peripheral edema  MS: moves all extremities without noticeable abnormality  Skin: Fine edematous papular rash on the back and a small amount on the stomach, excoriations  PSYCH: pleasant and cooperative, no obvious depression or anxiety  ASSESSMENT AND PLAN:  Discussed the following assessment and plan:  Dermatitis  Hypertension associated with diabetes ([place]) - Plan: CBC  Type 2 diabetes mellitus without complication, without long-term current use of insulin (HCC) - Plan: Comprehensive metabolic panel, Hemoglobin A1c  Hyperlipidemia associated with type 2 diabetes mellitus (HCC)  -Skin rash sounds like a contact dermatitis or eczema, we will try treating with an antihistamine, hypoallergenic skin regimen, good emollient and topical steroid -Husband is very worried about any liver or kidney problems causing this we will check her labs today, but this does not look like that type of rash and she has no other symptoms -Labs for chronic disease today -refused flu shot -Follow-up 3-4 weeks about the rash -Patient advised to return or notify a doctor immediately if symptoms worsen or persist or new concerns arise.  Patient Instructions   BEFORE YOU LEAVE: -Labs  -follow up: Follow-up in 1 month for the rash   Use a hypoallergenic body wash, detergent and any products on the skin. Combined Aquaphor with a small amount of the steroid cream (triamcinolone) and applied twice daily to the itchy areas. Zyrtec once nightly for 1 month.  We have ordered labs or studies at this visit. It can take up to 1-2 weeks for results and processing. IF results require follow up or explanation, we will call you with instructions. Clinically stable results will be released to your [facility]. If you have not heard from [place] or cannot find your results in [facility] in 2 weeks please contact our office at  [phone].  If you are not yet signed up for [facility], please consider signing up.   We recommend the following healthy lifestyle for LIFE: 1) Small portions. But, make sure to get regular (at least 3 per day), healthy meals and small healthy snacks if needed.  2) Eat a healthy clean diet.   TRY TO EAT: -at least 5-7 servings of low sugar, colorful, and nutrient rich vegetables per day (not corn, potatoes or bananas.) -berries are the best choice if you wish to eat fruit (only eat small amounts if trying to reduce weight)  -lean meets (fish, white meat of chicken or [place]) -vegan proteins for some meals -  beans or tofu, whole grains, nuts and seeds -Replace bad fats with good fats - good fats include: fish, nuts and seeds, canola oil, olive oil -small amounts of low fat or non fat dairy -small amounts of100 % whole grains - check the lables -drink plenty of water  AVOID: -SUGAR, sweets, anything with added sugar, corn syrup or sweeteners - must read labels as even foods advertised as "healthy" often are loaded with sugar -if you must have a sweetener, small amounts of stevia may be best -sweetened beverages and artificially sweetened beverages -simple starches (rice, bread, potatoes, pasta, chips, etc - small amounts of 100% whole grains are ok) -red meat, pork, butter -fried foods, fast food, processed food, excessive dairy, eggs and coconut.  3)Get at least 150 minutes of sweaty aerobic exercise per week.  4)Reduce stress - consider counseling, meditation and relaxation to balance other aspects of your life.    WE NOW OFFER   [facility] FAST TRACK!!!  SAME DAY Appointments for ACUTE CARE  Such as: Sprains, Injuries, cuts, abrasions, rashes, muscle pain, joint pain, back pain Colds, flu, sore throats, headache, allergies, cough, fever  Ear pain, sinus and eye infections Abdominal pain, nausea, vomiting, diarrhea, upset stomach Animal/insect bites  3 Easy  Ways to Schedule: Walk-In Scheduling Call in scheduling Mychart Sign-up: [email or web address]             Colin Benton R., DO

## 2016-12-01 ENCOUNTER — Encounter: Payer: Self-pay | Admitting: Family Medicine

## 2016-12-01 ENCOUNTER — Ambulatory Visit: Payer: BC Managed Care – PPO | Admitting: Family Medicine

## 2016-12-01 VITALS — BP 120/70 | HR 62 | Temp 98.2°F | Ht 63.0 in | Wt 123.2 lb

## 2016-12-01 DIAGNOSIS — E785 Hyperlipidemia, unspecified: Secondary | ICD-10-CM

## 2016-12-01 DIAGNOSIS — I10 Essential (primary) hypertension: Secondary | ICD-10-CM | POA: Diagnosis not present

## 2016-12-01 DIAGNOSIS — E1169 Type 2 diabetes mellitus with other specified complication: Secondary | ICD-10-CM

## 2016-12-01 DIAGNOSIS — E119 Type 2 diabetes mellitus without complications: Secondary | ICD-10-CM

## 2016-12-01 DIAGNOSIS — E1159 Type 2 diabetes mellitus with other circulatory complications: Secondary | ICD-10-CM | POA: Diagnosis not present

## 2016-12-01 DIAGNOSIS — L309 Dermatitis, unspecified: Secondary | ICD-10-CM

## 2016-12-01 DIAGNOSIS — I152 Hypertension secondary to endocrine disorders: Secondary | ICD-10-CM

## 2016-12-01 LAB — CBC
HCT: 40.9 % (ref 36.0–46.0)
Hemoglobin: 13.5 g/dL (ref 12.0–15.0)
MCHC: 33 g/dL (ref 30.0–36.0)
MCV: 88.9 fl (ref 78.0–100.0)
PLATELETS: 256 10*3/uL (ref 150.0–400.0)
RBC: 4.6 Mil/uL (ref 3.87–5.11)
RDW: 13.6 % (ref 11.5–15.5)
WBC: 4 10*3/uL (ref 4.0–10.5)

## 2016-12-01 LAB — COMPREHENSIVE METABOLIC PANEL
ALT: 16 U/L (ref 0–35)
AST: 19 U/L (ref 0–37)
Albumin: 4.5 g/dL (ref 3.5–5.2)
Alkaline Phosphatase: 68 U/L (ref 39–117)
BILIRUBIN TOTAL: 0.5 mg/dL (ref 0.2–1.2)
BUN: 14 mg/dL (ref 6–23)
CALCIUM: 9.9 mg/dL (ref 8.4–10.5)
CO2: 29 meq/L (ref 19–32)
CREATININE: 0.7 mg/dL (ref 0.40–1.20)
Chloride: 105 mEq/L (ref 96–112)
GFR: 89.83 mL/min (ref >60.00)
Glucose, Bld: 119 mg/dL — ABNORMAL HIGH (ref 70–99)
Potassium: 4.4 mEq/L (ref 3.5–5.1)
Sodium: 142 mEq/L (ref 135–145)
Total Protein: 7.1 g/dL (ref 6.0–8.3)

## 2016-12-01 LAB — HEMOGLOBIN A1C: Hgb A1c MFr Bld: 6.4 % (ref 4.6–6.5)

## 2016-12-01 MED ORDER — TRIAMCINOLONE ACETONIDE 0.1 % EX CREA: 1.0000 "application " | TOPICAL_CREAM | Freq: Two times a day (BID) | CUTANEOUS | 0 refills | Status: DC

## 2016-12-01 NOTE — Patient Instructions (Signed)
  BEFORE YOU LEAVE: -Labs  -follow up: Follow-up in 1 month for the rash   Use a hypoallergenic body wash, detergent and any products on the skin. Combined Aquaphor with a small amount of the steroid cream (triamcinolone) and applied twice daily to the itchy areas. Zyrtec once nightly for 1 month.  We have ordered labs or studies at this visit. It can take up to 1-2 weeks for results and processing. IF results require follow up or explanation, we will call you with instructions. Clinically stable results will be released to your [facility]. If you have not heard from [place] or cannot find your results in [facility] in 2 weeks please contact our office at [phone].  If you are not yet signed up for [facility], please consider signing up.   We recommend the following healthy lifestyle for LIFE: 1) Small portions. But, make sure to get regular (at least 3 per day), healthy meals and small healthy snacks if needed.  2) Eat a healthy clean diet.   TRY TO EAT: -at least 5-7 servings of low sugar, colorful, and nutrient rich vegetables per day (not corn, potatoes or bananas.) -berries are the best choice if you wish to eat fruit (only eat small amounts if trying to reduce weight)  -lean meets (fish, white meat of chicken or [place]) -vegan proteins for some meals - beans or tofu, whole grains, nuts and seeds -Replace bad fats with good fats - good fats include: fish, nuts and seeds, canola oil, olive oil -small amounts of low fat or non fat dairy -small amounts of100 % whole grains - check the lables -drink plenty of water  AVOID: -SUGAR, sweets, anything with added sugar, corn syrup or sweeteners - must read labels as even foods advertised as "healthy" often are loaded with sugar -if you must have a sweetener, small amounts of stevia may be best -sweetened beverages and artificially sweetened beverages -simple starches (rice, bread, potatoes, pasta, chips, etc - small amounts of 100% whole grains are  ok) -red meat, pork, butter -fried foods, fast food, processed food, excessive dairy, eggs and coconut.  3)Get at least 150 minutes of sweaty aerobic exercise per week.  4)Reduce stress - consider counseling, meditation and relaxation to balance other aspects of your life.    WE NOW OFFER   [facility] FAST TRACK!!!  SAME DAY Appointments for ACUTE CARE  Such as: Sprains, Injuries, cuts, abrasions, rashes, muscle pain, joint pain, back pain Colds, flu, sore throats, headache, allergies, cough, fever  Ear pain, sinus and eye infections Abdominal pain, nausea, vomiting, diarrhea, upset stomach Animal/insect bites  3 Easy Ways to Schedule: Walk-In Scheduling Call in scheduling Mychart Sign-up: [email or web address]

## 2017-01-07 ENCOUNTER — Other Ambulatory Visit: Payer: Self-pay | Admitting: Family Medicine

## 2017-01-10 NOTE — Telephone Encounter (Signed)
This pt needs follow up I next 1 month. Ok to refill for 1 month only. Thanks.

## 2017-01-14 ENCOUNTER — Encounter: Payer: Self-pay | Admitting: Family Medicine

## 2017-02-05 ENCOUNTER — Encounter: Payer: Self-pay | Admitting: Internal Medicine

## 2017-03-11 ENCOUNTER — Encounter: Payer: Self-pay | Admitting: Family Medicine

## 2017-03-11 ENCOUNTER — Ambulatory Visit: Payer: BC Managed Care – PPO | Admitting: Family Medicine

## 2017-03-11 ENCOUNTER — Encounter: Payer: Self-pay | Admitting: Internal Medicine

## 2017-03-11 VITALS — BP 120/80 | HR 70 | Temp 98.2°F | Ht 63.0 in | Wt 128.5 lb

## 2017-03-11 DIAGNOSIS — R1310 Dysphagia, unspecified: Secondary | ICD-10-CM

## 2017-03-11 DIAGNOSIS — E119 Type 2 diabetes mellitus without complications: Secondary | ICD-10-CM | POA: Diagnosis not present

## 2017-03-11 DIAGNOSIS — N644 Mastodynia: Secondary | ICD-10-CM

## 2017-03-11 DIAGNOSIS — I10 Essential (primary) hypertension: Secondary | ICD-10-CM | POA: Diagnosis not present

## 2017-03-11 DIAGNOSIS — K219 Gastro-esophageal reflux disease without esophagitis: Secondary | ICD-10-CM | POA: Diagnosis not present

## 2017-03-11 LAB — CBC
HCT: 42.7 % (ref 36.0–46.0)
Hemoglobin: 14 g/dL (ref 12.0–15.0)
MCHC: 32.7 g/dL (ref 30.0–36.0)
MCV: 88.1 fl (ref 78.0–100.0)
PLATELETS: 248 10*3/uL (ref 150.0–400.0)
RBC: 4.84 Mil/uL (ref 3.87–5.11)
RDW: 14 % (ref 11.5–15.5)
WBC: 4.7 10*3/uL (ref 4.0–10.5)

## 2017-03-11 LAB — BASIC METABOLIC PANEL
BUN: 12 mg/dL (ref 6–23)
CALCIUM: 9.8 mg/dL (ref 8.4–10.5)
CO2: 29 meq/L (ref 19–32)
Chloride: 101 mEq/L (ref 96–112)
Creatinine, Ser: 0.69 mg/dL (ref 0.40–1.20)
GFR: 91.26 mL/min (ref >60.00)
Glucose, Bld: 109 mg/dL — ABNORMAL HIGH (ref 70–99)
Potassium: 4.4 mEq/L (ref 3.5–5.1)
SODIUM: 137 meq/L (ref 135–145)

## 2017-03-11 LAB — HEMOGLOBIN A1C: HEMOGLOBIN A1C: 6.6 % — AB (ref 4.6–6.5)

## 2017-03-11 NOTE — Progress Notes (Signed)
HPI:  Using dictation device. Unfortunately this device frequently misinterprets words/phrases.  Victoria Neal is a very pleasant 62 year old here for an acute visit for several issues:  GERD: -several months -nightly symptoms -belching, heartburn, cough at night, difficulty swallowing solids, feels gets stuck and then coughs -no fevers, vomiting, diarrhea, bowel changes, wt loss -hx of gerd  Breast discomfort R breast: -for a week or so -? Itchy, prickly R nipple area -no discharge, lump, redness -had normal mammogram  She also wants to go ahead and get her labs for her regular follow-up of her diabetes and blood pressure.  She has questions about the losartan recall.  She continues to take her losartan, Crestor and metformin.  ROS: See pertinent positives and negatives per HPI.  Past Medical History:  Diagnosis Date  . Allergic rhinitis   . DM (diabetes mellitus) ([place])   . Glaucoma    ? of, but seeing optho and told recently not glaucoma  . High cholesterol   . HTN (hypertension)   . Internal hemorrhoids   . Osteoporosis    in 2010, has refused bisphosphnate treatment or treament other then vit d and calcium  . Panic anxiety syndrome 01/03/2013  . Pure hyperglyceridemia     Past Surgical History:  Procedure Laterality Date  . COLONOSCOPY  05/03/2006   internal hemorrhoids    Family History  Problem Relation Age of Onset  . Coronary artery disease Maternal Uncle   . Lung cancer Sister   . Cancer Sister        lung   . Breast cancer Maternal Aunt   . Colon cancer Mother        deceased age 84  . Cancer Mother 30  . Hypertension Mother   . Diabetes Father   . Hypertension Father   . Diabetes Paternal Aunt     Social History   Socioeconomic History  . Marital status: Married    Spouse name: None  . Number of children: None  . Years of education: None  . Highest education level: None  Social Needs  . Financial resource strain: None  . Food  insecurity - worry: None  . Food insecurity - inability: None  . Transportation needs - medical: None  . Transportation needs - non-medical: None  Occupational History  . None  Tobacco Use  . Smoking status: Never Smoker  . Smokeless tobacco: Never Used  Substance and Sexual Activity  . Alcohol use: Yes    Comment: occ - very rare  . Drug use: No  . Sexual activity: None  Other Topics Concern  . None  Social History Narrative   Work or School: 2nd grade at [employer] Situation: lives with husband and son - has 4 children, son in Facilities manager school, son animation, daughter is Pension scheme manager      Spiritual Beliefs: Baha'i faith - all religions are one, prayer based, no health care restrictions      Lifestyle: no CV exercise; diet is good              Current Outpatient Medications:  .  amLODipine (NORVASC) 5 MG tablet, TAKE ONE TABLET BY MOUTH ONCE DAILY., Disp: 90 tablet, Rfl: 1 .  FREESTYLE LITE test strip, , Disp: , Rfl:  .  Lancets (FREESTYLE) lancets, , Disp: , Rfl:  .  losartan (COZAAR) 25 MG tablet, TAKE ONE TABLET BY MOUTH ONCE DAILY, Disp: 90 tablet, Rfl: 1 .  metFORMIN (GLUCOPHAGE) 500 MG  tablet, Take 2 tablets (1,000 mg total) by mouth 2 (two) times daily with a meal., Disp: 180 tablet, Rfl: 3 .  rosuvastatin (CRESTOR) 10 MG tablet, Take 1 tablet (10 mg total) by mouth daily., Disp: 90 tablet, Rfl: 3 .  Specialty Vitamins Products (ONE-A-DAY BONE STRENGTH PO), Take 1 tablet by mouth daily. , Disp: , Rfl:  .  triamcinolone cream (KENALOG) 0.1 %, Apply 1 application topically 2 (two) times daily., Disp: 30 g, Rfl: 0  EXAM:  Vitals:   03/11/17 0948  BP: 120/80  Pulse: 70  Temp: 98.2 F (36.8 C)  SpO2: 98%    Body mass index is 22.76 kg/m.  GENERAL: vitals reviewed and listed above, alert, oriented, appears well hydrated and in no acute distress  HEENT: atraumatic, conjunttiva clear, no obvious abnormalities on inspection of external nose and ears,  normal appearance of ear canals and TMs, clear nasal congestion, mild post oropharyngeal erythema with PND, no tonsillar edema or exudate, no sinus TTP  NECK: no obvious masses on inspection  LUNGS: clear to auscultation bilaterally, no wheezes, rales or rhonchi, good air movement  CV: HRRR, no peripheral edema  BREAST: normal appearance of the breasts of concern except for some dry skin on the nipple area, on palpation of reast and axillary region no suspicious lesions appreciated today  MS: moves all extremities without noticeable abnormality  PSYCH: pleasant and cooperative, no obvious depression or anxiety  ASSESSMENT AND PLAN:  Discussed the following assessment and plan:   Gastroesophageal reflux disease, esophagitis presence not specified - Plan: Ambulatory referral to Gastroenterology Dysphagia, unspecified type --we discussed possible serious and likely etiologies, workup and treatment, treatment risks and return precautions -after this discussion, Victoria Neal opted for trial Nexium, lifestyle changes and referral to the gastroenterologist regarding the swallowing issues -follow up advised in 1 month -of course, we advised Victoria Neal  to return or notify a doctor immediately if symptoms worsen or persist or new concerns arise.  Essential hypertension, benign - Plan: Basic metabolic panel, CBC Type 2 diabetes mellitus without complication, without long-term current use of insulin (HCC) - Plan: Hemoglobin A1c -Labs per orders -Advised that she check with her pharmacy about the recalls to see if her medication was impacted  Nipple pain -Exam normal without any palpable masses, did have some dry skin on the nipple -Opted to try a yeast cream with close follow-up in 1 month -she is to follow-up sooner if any concerns -If symptoms persist will plan to see breast center for evaluation   Patient Instructions  BEFORE YOU LEAVE: -labs -follow up: 1 month  Start nexium over the counter  dose and take once daily.  I sent a referral to the Gastroenterologist.It usually takes about 1-2 weeks to process and schedule this referral. If you have not heard from [place] regarding this appointment in 2 weeks please contact our office.  We have ordered labs or studies at this visit. It can take up to 1-2 weeks for results and processing. IF results require follow up or explanation, we will call you with instructions. Clinically stable results will be released to your [facility]. If you have not heard from [place] or cannot find your results in [facility] in 2 weeks please contact our office at [phone].  If you are not yet signed up for [facility], please consider signing up.   Food Choices for Gastroesophageal Reflux Disease, Adult When you have gastroesophageal reflux disease (GERD), the foods you eat and your eating habits are very important.  Choosing the right foods can help ease your discomfort. What guidelines do I need to follow?  Choose fruits, vegetables, whole grains, and low-fat dairy products.  Choose low-fat meat, fish, and poultry.  Limit fats such as oils, salad dressings, butter, nuts, and avocado.  Keep a food diary. This helps you identify foods that cause symptoms.  Avoid foods that cause symptoms. These may be different for everyone.  Eat small meals often instead of 3 large meals a day.  Eat your meals slowly, in a place where you are relaxed.  Limit fried foods.  Cook foods using methods other than frying.  Avoid drinking alcohol.  Avoid drinking large amounts of liquids with your meals.  Avoid bending over or lying down until 2-3 hours after eating. What foods are not recommended? These are some foods and drinks that may make your symptoms worse: Vegetables Tomatoes. Tomato juice. Tomato and spaghetti sauce. Chili peppers. Onion and garlic. Horseradish. Fruits Oranges, grapefruit, and lemon (fruit and juice). Meats High-fat meats, fish, and poultry. This  includes hot dogs, ribs, ham, sausage, salami, and bacon. Dairy Whole milk and chocolate milk. Sour cream. Cream. Butter. Ice cream. Cream cheese. Drinks Coffee and tea. Bubbly (carbonated) drinks or energy drinks. Condiments Hot sauce. Barbecue sauce. Sweets/Desserts Chocolate and cocoa. Donuts. Peppermint and spearmint. Fats and Oils High-fat foods. This includes [place] fries and potato chips. Other Vinegar. Strong spices. This includes black pepper, white pepper, red pepper, cayenne, curry powder, cloves, ginger, and chili powder. The items listed above may not be a complete list of foods and drinks to avoid. Contact your dietitian for more information. This information is not intended to replace advice given to you by your health care provider. Make sure you discuss any questions you have with your health care provider. Document Released: 06/23/2011 Document Revised: 05/30/2015 Document Reviewed: 10/26/2012 Elsevier Interactive Patient Education  2017 [place], DO

## 2017-03-11 NOTE — Patient Instructions (Addendum)
BEFORE YOU LEAVE: -labs -follow up: 1 month  Start nexium over the counter dose and take once daily.  I sent a referral to the Gastroenterologist.It usually takes about 1-2 weeks to process and schedule this referral. If you have not heard from [place] regarding this appointment in 2 weeks please contact our office.  We have ordered labs or studies at this visit. It can take up to 1-2 weeks for results and processing. IF results require follow up or explanation, we will call you with instructions. Clinically stable results will be released to your [facility]. If you have not heard from [place] or cannot find your results in [facility] in 2 weeks please contact our office at [phone].  If you are not yet signed up for [facility], please consider signing up.   Food Choices for Gastroesophageal Reflux Disease, Adult When you have gastroesophageal reflux disease (GERD), the foods you eat and your eating habits are very important. Choosing the right foods can help ease your discomfort. What guidelines do I need to follow?  Choose fruits, vegetables, whole grains, and low-fat dairy products.  Choose low-fat meat, fish, and poultry.  Limit fats such as oils, salad dressings, butter, nuts, and avocado.  Keep a food diary. This helps you identify foods that cause symptoms.  Avoid foods that cause symptoms. These may be different for everyone.  Eat small meals often instead of 3 large meals a day.  Eat your meals slowly, in a place where you are relaxed.  Limit fried foods.  Cook foods using methods other than frying.  Avoid drinking alcohol.  Avoid drinking large amounts of liquids with your meals.  Avoid bending over or lying down until 2-3 hours after eating. What foods are not recommended? These are some foods and drinks that may make your symptoms worse: Vegetables Tomatoes. Tomato juice. Tomato and spaghetti sauce. Chili peppers. Onion and garlic. Horseradish. Fruits Oranges, grapefruit, and  lemon (fruit and juice). Meats High-fat meats, fish, and poultry. This includes hot dogs, ribs, ham, sausage, salami, and bacon. Dairy Whole milk and chocolate milk. Sour cream. Cream. Butter. Ice cream. Cream cheese. Drinks Coffee and tea. Bubbly (carbonated) drinks or energy drinks. Condiments Hot sauce. Barbecue sauce. Sweets/Desserts Chocolate and cocoa. Donuts. Peppermint and spearmint. Fats and Oils High-fat foods. This includes [place] fries and potato chips. Other Vinegar. Strong spices. This includes black pepper, white pepper, red pepper, cayenne, curry powder, cloves, ginger, and chili powder. The items listed above may not be a complete list of foods and drinks to avoid. Contact your dietitian for more information. This information is not intended to replace advice given to you by your health care provider. Make sure you discuss any questions you have with your health care provider. Document Released: 06/23/2011 Document Revised: 05/30/2015 Document Reviewed: 10/26/2012 Elsevier Interactive Patient Education  2017 [employer].

## 2017-03-15 ENCOUNTER — Ambulatory Visit: Payer: BC Managed Care – PPO | Admitting: Internal Medicine

## 2017-03-30 ENCOUNTER — Other Ambulatory Visit: Payer: Self-pay

## 2017-03-30 ENCOUNTER — Other Ambulatory Visit: Payer: Self-pay | Admitting: Family Medicine

## 2017-03-30 ENCOUNTER — Ambulatory Visit: Payer: BC Managed Care – PPO

## 2017-03-30 VITALS — Ht 63.0 in | Wt 127.2 lb

## 2017-03-30 DIAGNOSIS — Z1211 Encounter for screening for malignant neoplasm of colon: Secondary | ICD-10-CM

## 2017-03-30 DIAGNOSIS — Z1231 Encounter for screening mammogram for malignant neoplasm of breast: Secondary | ICD-10-CM

## 2017-03-30 NOTE — Progress Notes (Signed)
Denies allergies to eggs or soy products. Denies complication of anesthesia or sedation. Denies use of weight loss medication. Denies use of O2.   Emmi instructions declined.  

## 2017-03-31 ENCOUNTER — Encounter: Payer: Self-pay | Admitting: Internal Medicine

## 2017-04-08 ENCOUNTER — Other Ambulatory Visit: Payer: Self-pay | Admitting: Family Medicine

## 2017-04-13 ENCOUNTER — Ambulatory Visit: Payer: BC Managed Care – PPO | Admitting: Family Medicine

## 2017-04-14 ENCOUNTER — Encounter: Payer: BC Managed Care – PPO | Admitting: Internal Medicine

## 2017-05-13 ENCOUNTER — Ambulatory Visit
Admission: RE | Admit: 2017-05-13 | Discharge: 2017-05-13 | Disposition: A | Discharge status: Home / Self Care | Payer: BC Managed Care – PPO | Source: Ambulatory Visit | Attending: Family Medicine | Admitting: Family Medicine

## 2017-05-13 DIAGNOSIS — Z1231 Encounter for screening mammogram for malignant neoplasm of breast: Secondary | ICD-10-CM

## 2017-06-22 ENCOUNTER — Encounter: Payer: BC Managed Care – PPO | Admitting: Internal Medicine

## 2017-06-24 ENCOUNTER — Other Ambulatory Visit: Payer: Self-pay | Admitting: Family Medicine

## 2017-06-25 NOTE — Telephone Encounter (Signed)
Left message to schedule follow up appointment.

## 2017-07-03 ENCOUNTER — Other Ambulatory Visit: Payer: Self-pay | Admitting: Family Medicine

## 2017-08-02 NOTE — Progress Notes (Signed)
HPI:  Using dictation device. Unfortunately this device frequently misinterprets words/phrases.  Acute visit for several new concerns and follow up.  Due for labs, eye exam, foot exam - CPE next visit  GERD/chronic cough at night: -Reports this completely resolved with Nexium for 2 weeks -However, she did not know that she can continue taking the Nexium, so she stopped it and her symptoms returned She did not see a gastroenterologist for this since the symptoms resolved with PPI  DM -w HTN and HLD: -meds: norvasc, losartan, metformin, crestor Due for labs, foot exam and eye exam No chest pain, shortness of breath, vision change  Wants whooping cough vaccine booster as will be around grandchild newborn this fall.  ROS: See pertinent positives and negatives per HPI.  Past Medical History:  Diagnosis Date  . Allergic rhinitis   . Allergy   . Arthritis   . Cataract   . DM (diabetes mellitus) ([place])   . GERD (gastroesophageal reflux disease)   . Glaucoma    ? of, but seeing optho and told recently not glaucoma  . High cholesterol   . HTN (hypertension)   . Internal hemorrhoids   . Osteoporosis    in 2010, has refused bisphosphnate treatment or treament other then vit d and calcium  . Panic anxiety syndrome 01/03/2013  . Pure hyperglyceridemia     Past Surgical History:  Procedure Laterality Date  . CESAREAN SECTION    . COLONOSCOPY  05/03/2006   internal hemorrhoids    Family History  Problem Relation Age of Onset  . Coronary artery disease Maternal Uncle   . Lung cancer Sister   . Cancer Sister        lung   . Pancreatic cancer Sister   . Breast cancer Maternal Aunt   . Colon cancer Mother        deceased age 39  . Cancer Mother 27  . Hypertension Mother   . Diabetes Father   . Hypertension Father   . Diabetes Paternal Aunt   . Esophageal cancer Neg Hx   . Liver cancer Neg Hx   . Rectal cancer Neg Hx   . Stomach cancer Neg Hx     SOCIAL HX: See HPI, she  is planning to start running light, trying to eat healthier   Current Outpatient Medications:  .  amLODipine (NORVASC) 5 MG tablet, TAKE 1 TABLET BY MOUTH ONCE DAILY, Disp: 30 tablet, Rfl: 0 .  FREESTYLE LITE test strip, , Disp: , Rfl:  .  Lancets (FREESTYLE) lancets, , Disp: , Rfl:  .  losartan (COZAAR) 25 MG tablet, TAKE ONE TABLET BY MOUTH ONCE DAILY, Disp: 90 tablet, Rfl: 1 .  metFORMIN (GLUCOPHAGE) 500 MG tablet, Take 2 tablets (1,000 mg total) by mouth 2 (two) times daily with a meal. (Patient taking differently: Take 500 mg by mouth 2 (two) times daily with a meal. ), Disp: 180 tablet, Rfl: 3 .  polyethylene glycol powder (GLYCOLAX/MIRALAX) powder, Take 1 Container by mouth once., Disp: , Rfl:  .  rosuvastatin (CRESTOR) 10 MG tablet, TAKE 1 TABLET BY MOUTH ONCE DAILY, Disp: 90 tablet, Rfl: 3 .  Specialty Vitamins Products (ONE-A-DAY BONE STRENGTH PO), Take 1 tablet by mouth daily. , Disp: , Rfl:  .  triamcinolone cream (KENALOG) 0.1 %, Apply 1 application topically 2 (two) times daily., Disp: 30 g, Rfl: 0  EXAM:  Vitals:   08/03/17 1033  BP: 120/78  Pulse: 66  Temp: 98.4 F (36.9 C)  SpO2: 98%    Body mass index is 23.12 kg/m.  GENERAL: vitals reviewed and listed above, alert, oriented, appears well hydrated and in no acute distress  HEENT: atraumatic, conjunttiva clear, no obvious abnormalities on inspection of external nose and ears  NECK: no obvious masses on inspection  LUNGS: clear to auscultation bilaterally, no wheezes, rales or rhonchi, good air movement  CV: HRRR, no peripheral edema  ABD: soft, NEEP  MS: moves all extremities without noticeable abnormality Foot exam done, see epic   PSYCH: pleasant and cooperative, no obvious depression or anxiety  ASSESSMENT AND PLAN:  Discussed the following assessment and plan:  Type 2 diabetes mellitus without complication, without long-term current use of insulin (HCC) - Plan: Hemoglobin A1c  Hypertension  associated with diabetes ([place]) - Plan: Basic metabolic panel, CBC  Hyperlipidemia associated with type 2 diabetes mellitus ([place]) - Plan: Lipid panel  Gastroesophageal reflux disease, esophagitis presence not specified  -foot exam done -labs per orders -advised to schedule eye exam -due for colon ca screening - already referred -Tdap after discussion risks/benefit -nexium daily since resolved symptoms, low dose, discussed risks vs benefits -CPE 3 months -Patient advised to return or notify a doctor immediately if symptoms worsen or persist or new concerns arise.  Patient Instructions  BEFORE YOU LEAVE: -Tdap with pertussis - is going to be around newborn baby -labs -follow up: CPE in 3-4 months  Take the over the counter dose of Nexium daily.  Schedule your Diabetic eye exam.  Eat a healthy low sugar diet and get at least 150 minutes of aerobic exercise per week.  We have ordered labs or studies at this visit. It can take up to 1-2 weeks for results and processing. IF results require follow up or explanation, we will call you with instructions. Clinically stable results will be released to your [facility]. If you have not heard from [place] or cannot find your results in [facility] in 2 weeks please contact our office at [phone].  If you are not yet signed up for [facility], please consider signing up.   Food Choices for Gastroesophageal Reflux Disease, Adult When you have gastroesophageal reflux disease (GERD), the foods you eat and your eating habits are very important. Choosing the right foods can help ease your discomfort. What guidelines do I need to follow?  Choose fruits, vegetables, whole grains, and low-fat dairy products.  Choose low-fat meat, fish, and poultry.  Limit fats such as oils, salad dressings, butter, nuts, and avocado.  Keep a food diary. This helps you identify foods that cause symptoms.  Avoid foods that cause symptoms. These may be different for  everyone.  Eat small meals often instead of 3 large meals a day.  Eat your meals slowly, in a place where you are relaxed.  Limit fried foods.  Cook foods using methods other than frying.  Avoid drinking alcohol.  Avoid drinking large amounts of liquids with your meals.  Avoid bending over or lying down until 2-3 hours after eating. What foods are not recommended? These are some foods and drinks that may make your symptoms worse: Vegetables Tomatoes. Tomato juice. Tomato and spaghetti sauce. Chili peppers. Onion and garlic. Horseradish. Fruits Oranges, grapefruit, and lemon (fruit and juice). Meats High-fat meats, fish, and poultry. This includes hot dogs, ribs, ham, sausage, salami, and bacon. Dairy Whole milk and chocolate milk. Sour cream. Cream. Butter. Ice cream. Cream cheese. Drinks Coffee and tea. Bubbly (carbonated) drinks or energy drinks. Condiments Hot sauce. Barbecue sauce.  Sweets/Desserts Chocolate and cocoa. Donuts. Peppermint and spearmint. Fats and Oils High-fat foods. This includes [place] fries and potato chips. Other Vinegar. Strong spices. This includes black pepper, white pepper, red pepper, cayenne, curry powder, cloves, ginger, and chili powder. The items listed above may not be a complete list of foods and drinks to avoid. Contact your dietitian for more information. This information is not intended to replace advice given to you by your health care provider. Make sure you discuss any questions you have with your health care provider. Document Released: 06/23/2011 Document Revised: 05/30/2015 Document Reviewed: 10/26/2012 Elsevier Interactive Patient Education  2017 [place], DO

## 2017-08-03 ENCOUNTER — Ambulatory Visit: Payer: BC Managed Care – PPO | Admitting: Family Medicine

## 2017-08-03 ENCOUNTER — Encounter: Payer: Self-pay | Admitting: Family Medicine

## 2017-08-03 VITALS — BP 120/78 | HR 66 | Temp 98.4°F | Ht 63.0 in | Wt 130.5 lb

## 2017-08-03 DIAGNOSIS — E1159 Type 2 diabetes mellitus with other circulatory complications: Secondary | ICD-10-CM | POA: Diagnosis not present

## 2017-08-03 DIAGNOSIS — E1169 Type 2 diabetes mellitus with other specified complication: Secondary | ICD-10-CM | POA: Diagnosis not present

## 2017-08-03 DIAGNOSIS — I10 Essential (primary) hypertension: Secondary | ICD-10-CM

## 2017-08-03 DIAGNOSIS — Z23 Encounter for immunization: Secondary | ICD-10-CM | POA: Diagnosis not present

## 2017-08-03 DIAGNOSIS — K219 Gastro-esophageal reflux disease without esophagitis: Secondary | ICD-10-CM | POA: Diagnosis not present

## 2017-08-03 DIAGNOSIS — E785 Hyperlipidemia, unspecified: Secondary | ICD-10-CM

## 2017-08-03 DIAGNOSIS — E119 Type 2 diabetes mellitus without complications: Secondary | ICD-10-CM | POA: Diagnosis not present

## 2017-08-03 LAB — BASIC METABOLIC PANEL
BUN: 12 mg/dL (ref 6–23)
CO2: 29 meq/L (ref 19–32)
Calcium: 9.7 mg/dL (ref 8.4–10.5)
Chloride: 103 mEq/L (ref 96–112)
Creatinine, Ser: 0.69 mg/dL (ref 0.40–1.20)
GFR: 91.14 mL/min (ref >60.00)
Glucose, Bld: 119 mg/dL — ABNORMAL HIGH (ref 70–99)
Potassium: 4.5 mEq/L (ref 3.5–5.1)
Sodium: 140 mEq/L (ref 135–145)

## 2017-08-03 LAB — LIPID PANEL
Cholesterol: 180 mg/dL (ref 0–200)
HDL: 85.6 mg/dL (ref >39.00)
LDL Cholesterol: 65 mg/dL (ref 0–99)
NonHDL: 94.87
TRIGLYCERIDES: 148 mg/dL (ref 0.0–149.0)
Total CHOL/HDL Ratio: 2
VLDL: 29.6 mg/dL (ref 0.0–40.0)

## 2017-08-03 LAB — HEMOGLOBIN A1C: Hgb A1c MFr Bld: 6.8 % — ABNORMAL HIGH (ref 4.6–6.5)

## 2017-08-03 LAB — CBC
HCT: 40.4 % (ref 36.0–46.0)
Hemoglobin: 13.6 g/dL (ref 12.0–15.0)
MCHC: 33.6 g/dL (ref 30.0–36.0)
MCV: 87 fl (ref 78.0–100.0)
Platelets: 270 10*3/uL (ref 150.0–400.0)
RBC: 4.64 Mil/uL (ref 3.87–5.11)
RDW: 13.9 % (ref 11.5–15.5)
WBC: 4.5 10*3/uL (ref 4.0–10.5)

## 2017-08-03 NOTE — Addendum Note (Signed)
Addended by: Agnes Lawrence on: 08/03/2017 11:35 AM   Modules accepted: Orders

## 2017-08-03 NOTE — Patient Instructions (Signed)
BEFORE YOU LEAVE: -Tdap with pertussis - is going to be around newborn baby -labs -follow up: CPE in 3-4 months  Take the over the counter dose of Nexium daily.  Schedule your Diabetic eye exam.  Eat a healthy low sugar diet and get at least 150 minutes of aerobic exercise per week.  We have ordered labs or studies at this visit. It can take up to 1-2 weeks for results and processing. IF results require follow up or explanation, we will call you with instructions. Clinically stable results will be released to your [facility]. If you have not heard from [place] or cannot find your results in [facility] in 2 weeks please contact our office at [phone].  If you are not yet signed up for [facility], please consider signing up.   Food Choices for Gastroesophageal Reflux Disease, Adult When you have gastroesophageal reflux disease (GERD), the foods you eat and your eating habits are very important. Choosing the right foods can help ease your discomfort. What guidelines do I need to follow?  Choose fruits, vegetables, whole grains, and low-fat dairy products.  Choose low-fat meat, fish, and poultry.  Limit fats such as oils, salad dressings, butter, nuts, and avocado.  Keep a food diary. This helps you identify foods that cause symptoms.  Avoid foods that cause symptoms. These may be different for everyone.  Eat small meals often instead of 3 large meals a day.  Eat your meals slowly, in a place where you are relaxed.  Limit fried foods.  Cook foods using methods other than frying.  Avoid drinking alcohol.  Avoid drinking large amounts of liquids with your meals.  Avoid bending over or lying down until 2-3 hours after eating. What foods are not recommended? These are some foods and drinks that may make your symptoms worse: Vegetables Tomatoes. Tomato juice. Tomato and spaghetti sauce. Chili peppers. Onion and garlic. Horseradish. Fruits Oranges, grapefruit, and lemon (fruit and  juice). Meats High-fat meats, fish, and poultry. This includes hot dogs, ribs, ham, sausage, salami, and bacon. Dairy Whole milk and chocolate milk. Sour cream. Cream. Butter. Ice cream. Cream cheese. Drinks Coffee and tea. Bubbly (carbonated) drinks or energy drinks. Condiments Hot sauce. Barbecue sauce. Sweets/Desserts Chocolate and cocoa. Donuts. Peppermint and spearmint. Fats and Oils High-fat foods. This includes [place] fries and potato chips. Other Vinegar. Strong spices. This includes black pepper, white pepper, red pepper, cayenne, curry powder, cloves, ginger, and chili powder. The items listed above may not be a complete list of foods and drinks to avoid. Contact your dietitian for more information. This information is not intended to replace advice given to you by your health care provider. Make sure you discuss any questions you have with your health care provider. Document Released: 06/23/2011 Document Revised: 05/30/2015 Document Reviewed: 10/26/2012 Elsevier Interactive Patient Education  2017 [employer].

## 2017-08-10 ENCOUNTER — Telehealth: Payer: Self-pay | Admitting: Family Medicine

## 2017-08-10 NOTE — Telephone Encounter (Signed)
Pt given results per Dr Maudie Mercury, "Cbc and lipids normalm bmp ok; Diabetes lab unfortunately a little higher. Recommend a health low sugar diet, regular aerobic exercise, avoid white starchy food and sweets and recheck in 3-4 months. Continue metformin."; she verbalizes understanding and says that she set up lab appointment when she sets up her physical appointment; unable to chart in result note because no encounter created.

## 2017-08-18 ENCOUNTER — Other Ambulatory Visit: Payer: Self-pay | Admitting: Family Medicine

## 2017-10-06 ENCOUNTER — Ambulatory Visit (INDEPENDENT_AMBULATORY_CARE_PROVIDER_SITE_OTHER): Payer: BC Managed Care – PPO

## 2017-10-06 DIAGNOSIS — Z23 Encounter for immunization: Secondary | ICD-10-CM | POA: Diagnosis not present

## 2017-10-19 ENCOUNTER — Encounter: Payer: BC Managed Care – PPO | Admitting: Family Medicine

## 2017-10-22 IMAGING — DX DG CHEST 2V
2 series · 2 of 2 positions shown · non-contrast
Comparison: Chest x-ray of 12/17/2012

CLINICAL DATA: Cough, fever, fatigue for 2 days, diabetes

EXAM:
CHEST  2 VIEW

[chest pa]
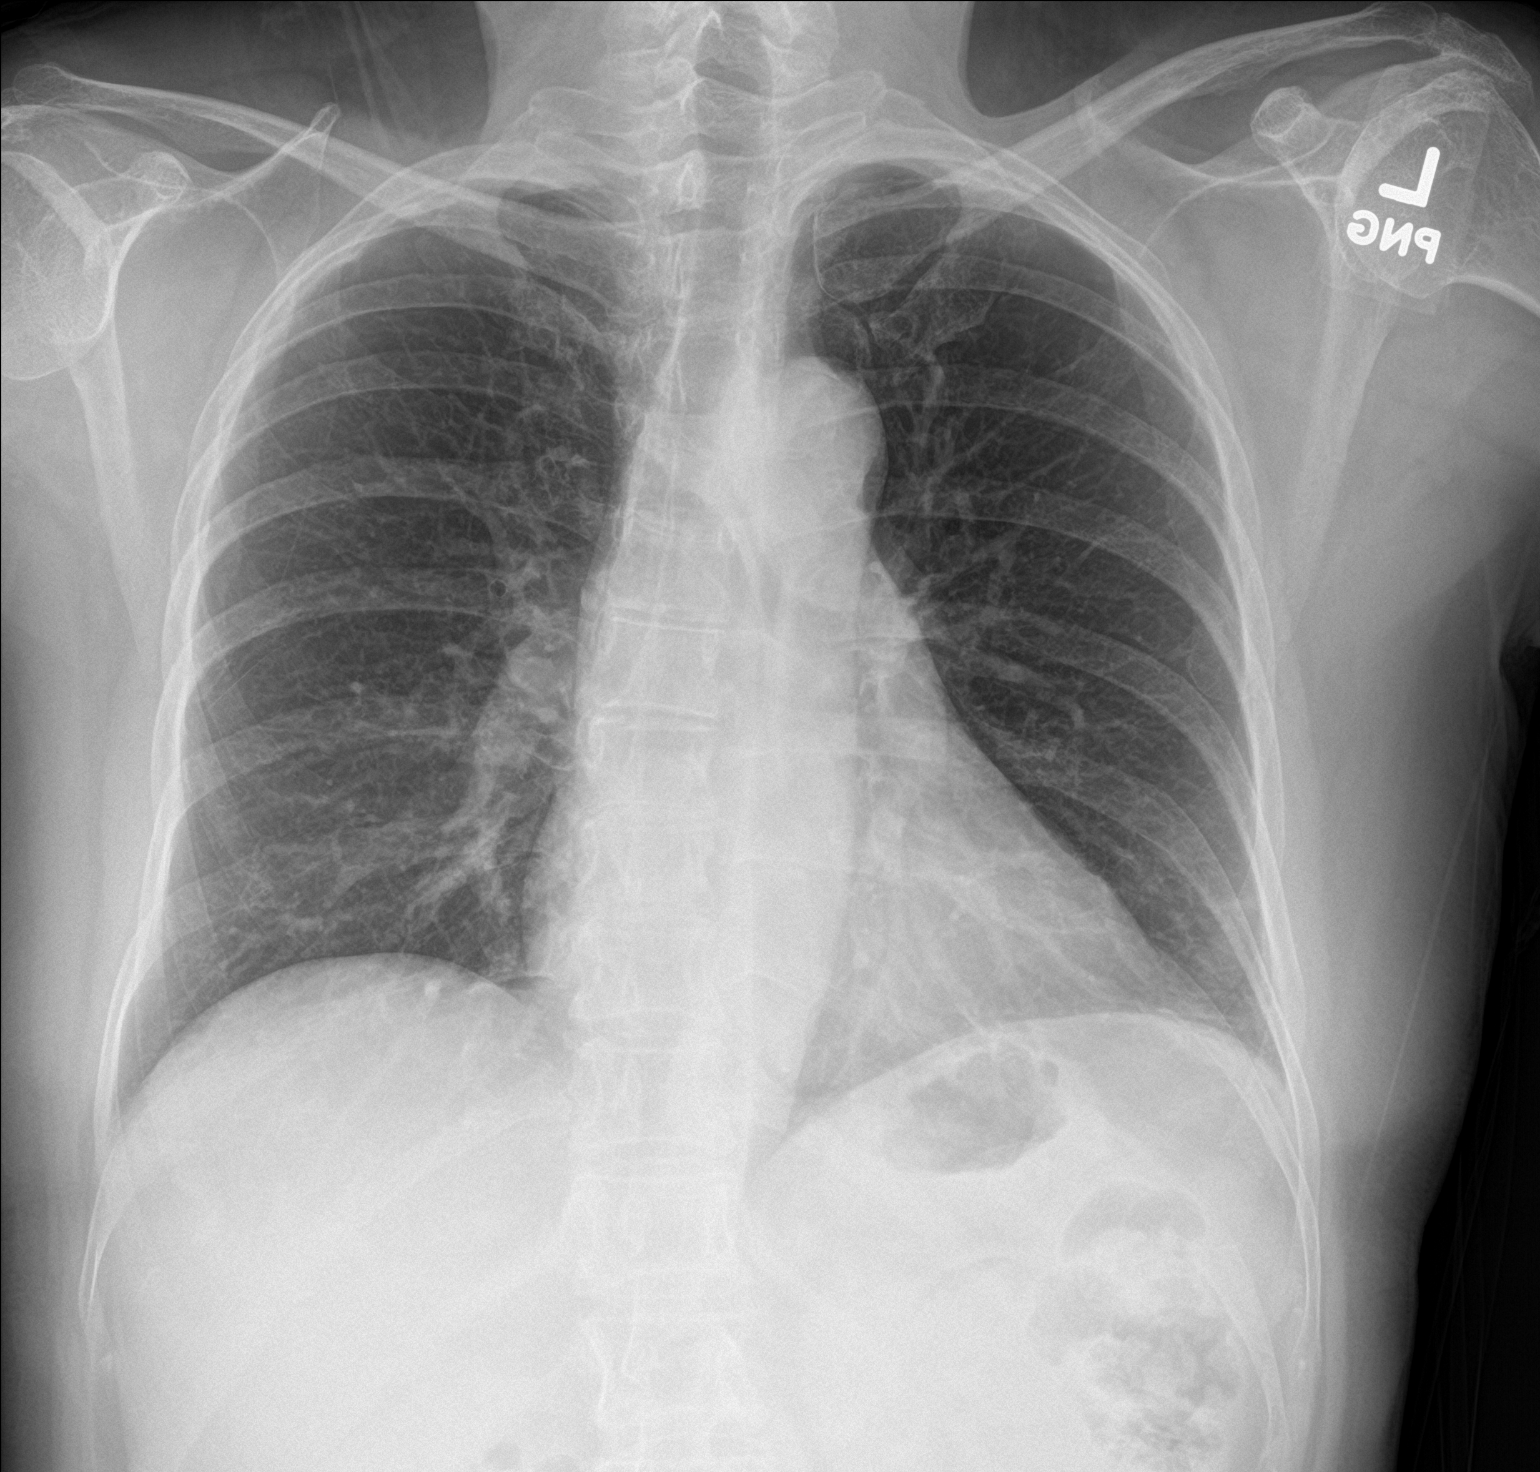

[chest lat]
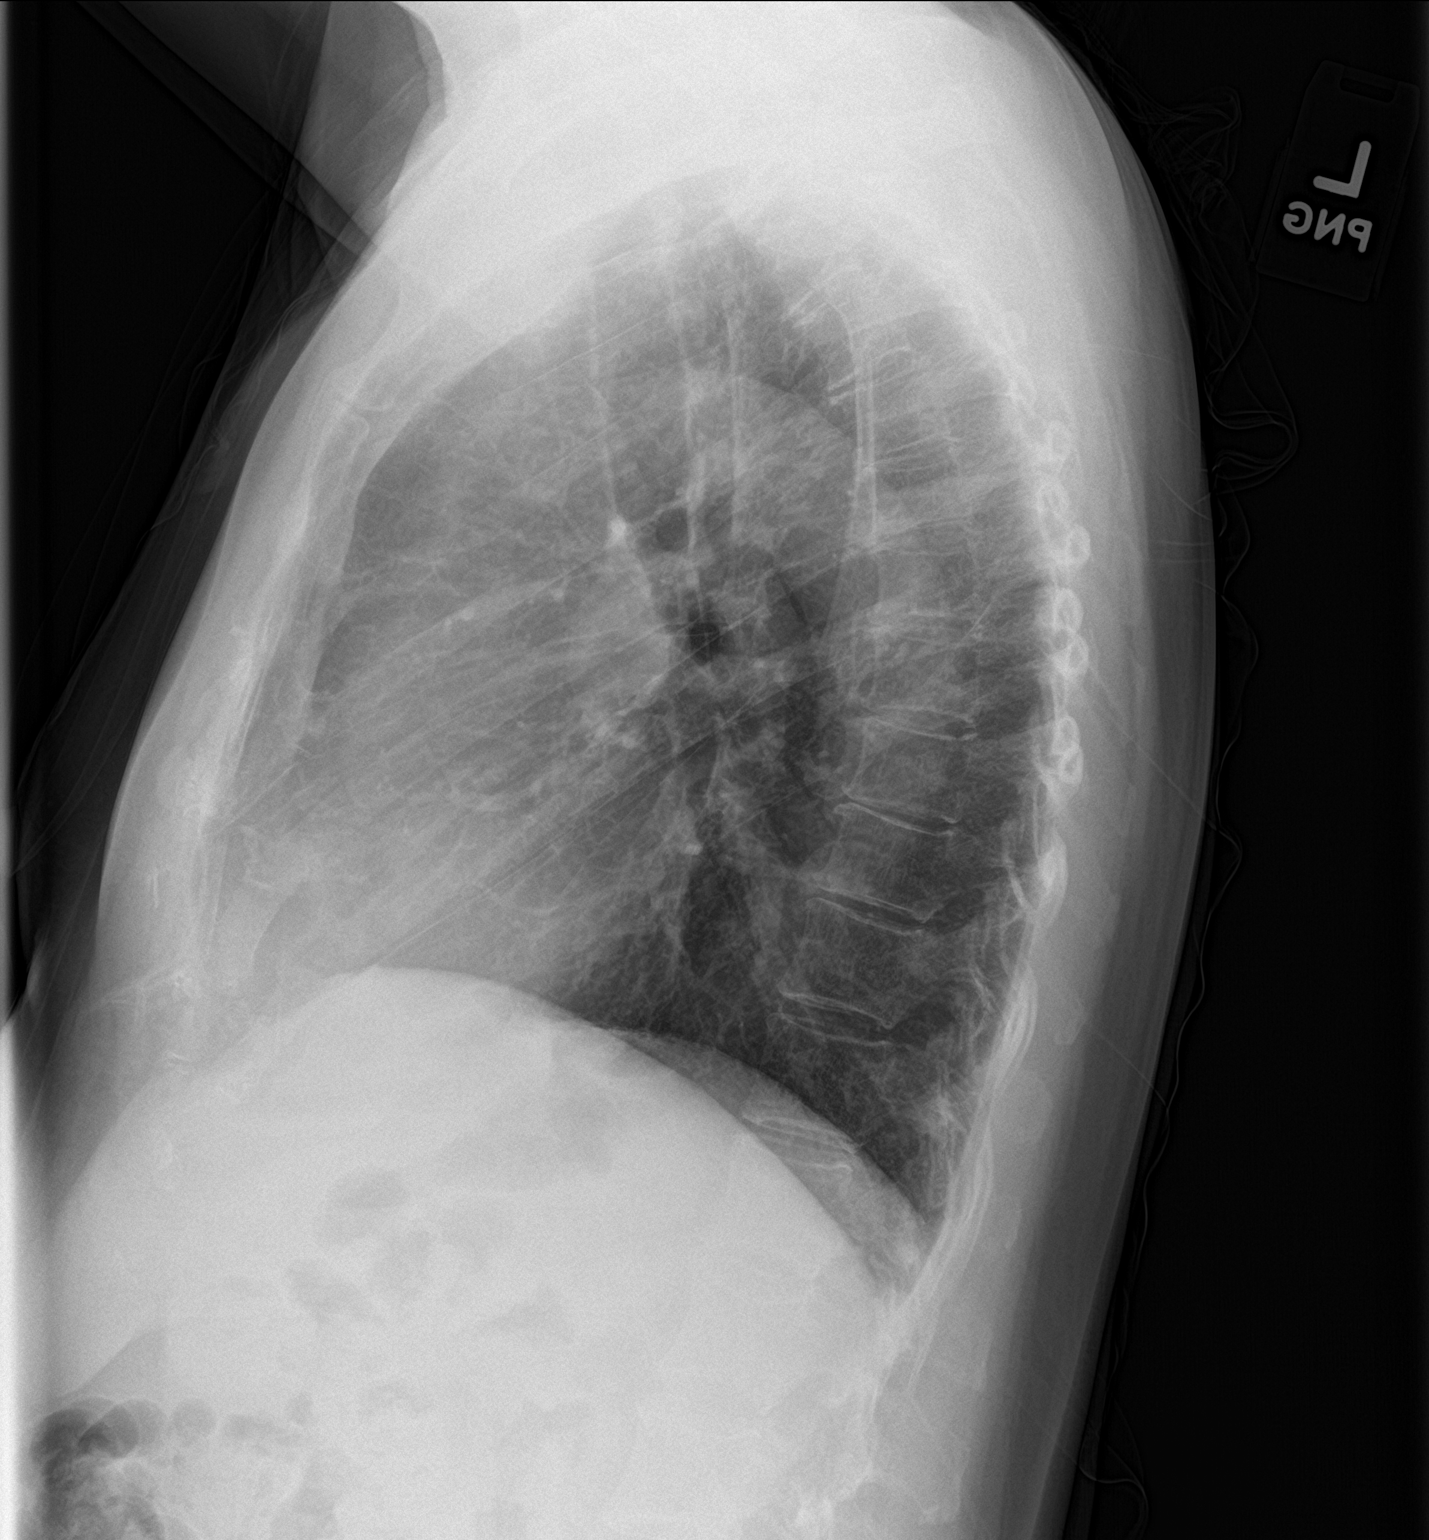

[2 of 2 positions shown; findings below may reference images not displayed]

FINDINGS: No active infiltrate or effusion is seen. Minimal linear atelectasis
or scarring is present at the left lung base. Mediastinal and hilar
contours are unremarkable. The heart is within upper limits of
normal. No acute bony abnormality is seen.
IMPRESSION: No active cardiopulmonary disease. Heart upper normal limits in
size.

## 2017-11-02 ENCOUNTER — Encounter: Payer: BC Managed Care – PPO | Admitting: Family Medicine

## 2017-11-11 ENCOUNTER — Other Ambulatory Visit: Payer: Self-pay | Admitting: Family Medicine

## 2017-11-26 ENCOUNTER — Other Ambulatory Visit: Payer: Self-pay

## 2017-11-26 ENCOUNTER — Ambulatory Visit: Payer: BC Managed Care – PPO | Admitting: Family Medicine

## 2017-11-26 ENCOUNTER — Encounter: Payer: Self-pay | Admitting: Family Medicine

## 2017-11-26 VITALS — BP 114/70 | HR 72 | Temp 98.0°F | Ht 63.0 in | Wt 127.2 lb

## 2017-11-26 DIAGNOSIS — N3000 Acute cystitis without hematuria: Secondary | ICD-10-CM

## 2017-11-26 MED ORDER — NITROFURANTOIN MONOHYD MACRO 100 MG PO CAPS: 100.0000 mg | ORAL_CAPSULE | Freq: Two times a day (BID) | ORAL | 0 refills | Status: DC

## 2017-11-26 NOTE — Patient Instructions (Signed)
Please follow up if symptoms do not improve or as needed.    Urinary Tract Infection, Adult A urinary tract infection (UTI) is an infection of any part of the urinary tract, which includes the kidneys, ureters, bladder, and urethra. These organs make, store, and get rid of urine in the body. UTI can be a bladder infection (cystitis) or kidney infection (pyelonephritis). What are the causes? This infection may be caused by fungi, viruses, or bacteria. Bacteria are the most common cause of UTIs. This condition can also be caused by repeated incomplete emptying of the bladder during urination. What increases the risk? This condition is more likely to develop if:  You ignore your need to urinate or hold urine for long periods of time.  You do not empty your bladder completely during urination.  You wipe back to front after urinating or having a bowel movement, if you are female.  You are uncircumcised, if you are female.  You are constipated.  You have a urinary catheter that stays in place (indwelling).  You have a weak defense (immune) system.  You have a medical condition that affects your bowels, kidneys, or bladder.  You have diabetes.  You take antibiotic medicines frequently or for long periods of time, and the antibiotics no longer work well against certain types of infections (antibiotic resistance).  You take medicines that irritate your urinary tract.  You are exposed to chemicals that irritate your urinary tract.  You are female.  What are the signs or symptoms? Symptoms of this condition include:  Fever.  Frequent urination or passing small amounts of urine frequently.  Needing to urinate urgently.  Pain or burning with urination.  Urine that smells bad or unusual.  Cloudy urine.  Pain in the lower abdomen or back.  Trouble urinating.  Blood in the urine.  Vomiting or being less hungry than normal.  Diarrhea or abdominal pain.  Vaginal discharge, if  you are female.  How is this diagnosed? This condition is diagnosed with a medical history and physical exam. You will also need to provide a urine sample to test your urine. Other tests may be done, including:  Blood tests.  Sexually transmitted disease (STD) testing.  If you have had more than one UTI, a cystoscopy or imaging studies may be done to determine the cause of the infections. How is this treated? Treatment for this condition often includes a combination of two or more of the following:  Antibiotic medicine.  Other medicines to treat less common causes of UTI.  Over-the-counter medicines to treat pain.  Drinking enough water to stay hydrated.  Follow these instructions at home:  Take over-the-counter and prescription medicines only as told by your health care provider.  If you were prescribed an antibiotic, take it as told by your health care provider. Do not stop taking the antibiotic even if you start to feel better.  Avoid alcohol, caffeine, tea, and carbonated beverages. They can irritate your bladder.  Drink enough fluid to keep your urine clear or pale yellow.  Keep all follow-up visits as told by your health care provider. This is important.  Make sure to: ? Empty your bladder often and completely. Do not hold urine for long periods of time. ? Empty your bladder before and after sex. ? Wipe from front to back after a bowel movement if you are female. Use each tissue one time when you wipe. Contact a health care provider if:  You have back pain.  You  have a fever.  You feel nauseous or vomit.  Your symptoms do not get better after 3 days.  Your symptoms go away and then return. Get help right away if:  You have severe back pain or lower abdominal pain.  You are vomiting and cannot keep down any medicines or water. This information is not intended to replace advice given to you by your health care provider. Make sure you discuss any questions you  have with your health care provider. Document Released: 10/01/2004 Document Revised: 06/05/2015 Document Reviewed: 11/12/2014 Elsevier Interactive Patient Education  [employer].

## 2017-11-26 NOTE — Progress Notes (Signed)
Subjective   CC:  Chief Complaint  Patient presents with  . Dysuria    burning with urination x 2 days    HPI: Victoria Neal is a 62 y.o. female who presents to the office today to address the problems listed above in the chief complaint.  Patient reports dysuria and urinary frequency.  She has sensation of increased urinary pressure.  She denies fevers flank pain nausea vomiting or gross hematuria.  Symptoms have been present for several hours to days.  She denies history of interstitial cystitis.  She denies vaginal symptoms including vaginal discharge or pelvic pain. Has been using AZO with some success. UA was therefore not processed.  She has well controlled diabetes. Now eating vegan.  Lab Results  Component Value Date   HGBA1C 6.8 (H) 08/03/2017    Assessment  1. Acute cystitis without hematuria      Plan   Treat empirically due to clinical sxs. Check urine culture. F/u if needed   Follow up: prn  Orders Placed This Encounter  Procedures  . Urine Culture   Meds ordered this encounter  Medications  . nitrofurantoin, macrocrystal-monohydrate, (MACROBID) 100 MG capsule    Sig: Take 1 capsule (100 mg total) by mouth 2 (two) times daily.    Dispense:  10 capsule    Refill:  0      I reviewed the patients updated PMH, FH, and SocHx.    Patient Active Problem List   Diagnosis Date Noted  . Back pain 12/10/2015  . Rib pain 12/10/2015  . Osteopenia 08/27/2015  . Type 2 diabetes mellitus without complication ([place]) 57/84/6962  . Essential hypertension, benign 09/27/2012  . Hyperlipemia 09/02/2009   Current Meds  Medication Sig  . amLODipine (NORVASC) 5 MG tablet TAKE 1 TABLET BY MOUTH ONCE DAILY  . FREESTYLE LITE test strip   . Lancets (FREESTYLE) lancets   . losartan (COZAAR) 25 MG tablet TAKE ONE TABLET BY MOUTH ONCE DAILY  . metFORMIN (GLUCOPHAGE) 500 MG tablet Take 2 tablets (1,000 mg total) by mouth 2 (two) times daily with a meal. (Patient taking  differently: Take 500 mg by mouth 2 (two) times daily with a meal. )  . polyethylene glycol powder (GLYCOLAX/MIRALAX) powder Take 1 Container by mouth once.  . rosuvastatin (CRESTOR) 10 MG tablet TAKE 1 TABLET BY MOUTH ONCE DAILY  . Specialty Vitamins Products (ONE-A-DAY BONE STRENGTH PO) Take 1 tablet by mouth daily.   . [DISCONTINUED] triamcinolone cream (KENALOG) 0.1 % Apply 1 application topically 2 (two) times daily.    Review of Systems: Cardiovascular: negative for chest pain Respiratory: negative for SOB or persistent cough Gastrointestinal: negative for abdominal pain Constitutional: Negative for fever malaise or anorexia  Objective  Vitals: BP 114/70   Pulse 72   Temp 98 F (36.7 C)   Ht 5\' 3"  (1.6 m)   Wt 127 lb 3.2 oz (57.7 kg)   SpO2 98%   BMI 22.53 kg/m  General: no acute distress  Psych:  Alert and oriented, normal mood and affect Cardiovascular:  RRR without murmur or gallop. no peripheral edema Respiratory:  Good breath sounds bilaterally, CTAB with normal respiratory effort Gastrointestinal: soft, flat abdomen, normal active bowel sounds, no palpable masses, no hepatosplenomegaly, no appreciated hernias, NO CVAT, mild suprapubic ttp w/o rebound or guarding Skin:  Warm, no rashes Neurologic:   Mental status is normal. normal gait   Commons side effects, risks, benefits, and alternatives for medications and treatment plan prescribed today were  discussed, and the patient expressed understanding of the given instructions. Patient is instructed to call or message via MyChart if he/she has any questions or concerns regarding our treatment plan. No barriers to understanding were identified. We discussed Red Flag symptoms and signs in detail. Patient expressed understanding regarding what to do in case of urgent or emergency type symptoms.   Medication list was reconciled, printed and provided to the patient in AVS. Patient instructions and summary information was reviewed  with the patient as documented in the AVS. This note was prepared with assistance of Dragon voice recognition software. Occasional wrong-word or sound-a-like substitutions may have occurred due to the inherent limitations of voice recognition software

## 2017-11-28 LAB — URINE CULTURE
MICRO NUMBER:: 91413953
SPECIMEN QUALITY: ADEQUATE

## 2017-11-30 ENCOUNTER — Other Ambulatory Visit: Payer: Self-pay | Admitting: *Deleted

## 2017-11-30 DIAGNOSIS — E119 Type 2 diabetes mellitus without complications: Secondary | ICD-10-CM

## 2017-11-30 MED ORDER — METFORMIN HCL 500 MG PO TABS: 1000.0000 mg | ORAL_TABLET | Freq: Two times a day (BID) | ORAL | 0 refills | Status: DC

## 2017-11-30 NOTE — Telephone Encounter (Signed)
Rx done. 

## 2017-12-14 ENCOUNTER — Encounter: Payer: Self-pay | Admitting: Family Medicine

## 2017-12-14 ENCOUNTER — Ambulatory Visit (INDEPENDENT_AMBULATORY_CARE_PROVIDER_SITE_OTHER): Payer: BC Managed Care – PPO | Admitting: Family Medicine

## 2017-12-14 VITALS — BP 120/70 | HR 74 | Temp 98.7°F | Ht 62.75 in | Wt 125.3 lb

## 2017-12-14 DIAGNOSIS — E119 Type 2 diabetes mellitus without complications: Secondary | ICD-10-CM | POA: Diagnosis not present

## 2017-12-14 DIAGNOSIS — I152 Hypertension secondary to endocrine disorders: Secondary | ICD-10-CM

## 2017-12-14 DIAGNOSIS — E1169 Type 2 diabetes mellitus with other specified complication: Secondary | ICD-10-CM | POA: Diagnosis not present

## 2017-12-14 DIAGNOSIS — E1159 Type 2 diabetes mellitus with other circulatory complications: Secondary | ICD-10-CM

## 2017-12-14 DIAGNOSIS — I10 Essential (primary) hypertension: Secondary | ICD-10-CM | POA: Diagnosis not present

## 2017-12-14 DIAGNOSIS — Z0000 Encounter for general adult medical examination without abnormal findings: Secondary | ICD-10-CM

## 2017-12-14 DIAGNOSIS — E785 Hyperlipidemia, unspecified: Secondary | ICD-10-CM

## 2017-12-14 IMAGING — DX DG CHEST 2V
2 series · 2 of 2 positions shown · non-contrast
Comparison: PA and lateral chest x-ray August 19, 2015 and
December 17, 2012.

CLINICAL DATA: Positive PPD. No current chest complaints. History
of diabetes ease and hyperlipidemia

EXAM:
CHEST  2 VIEW

[chest pa]
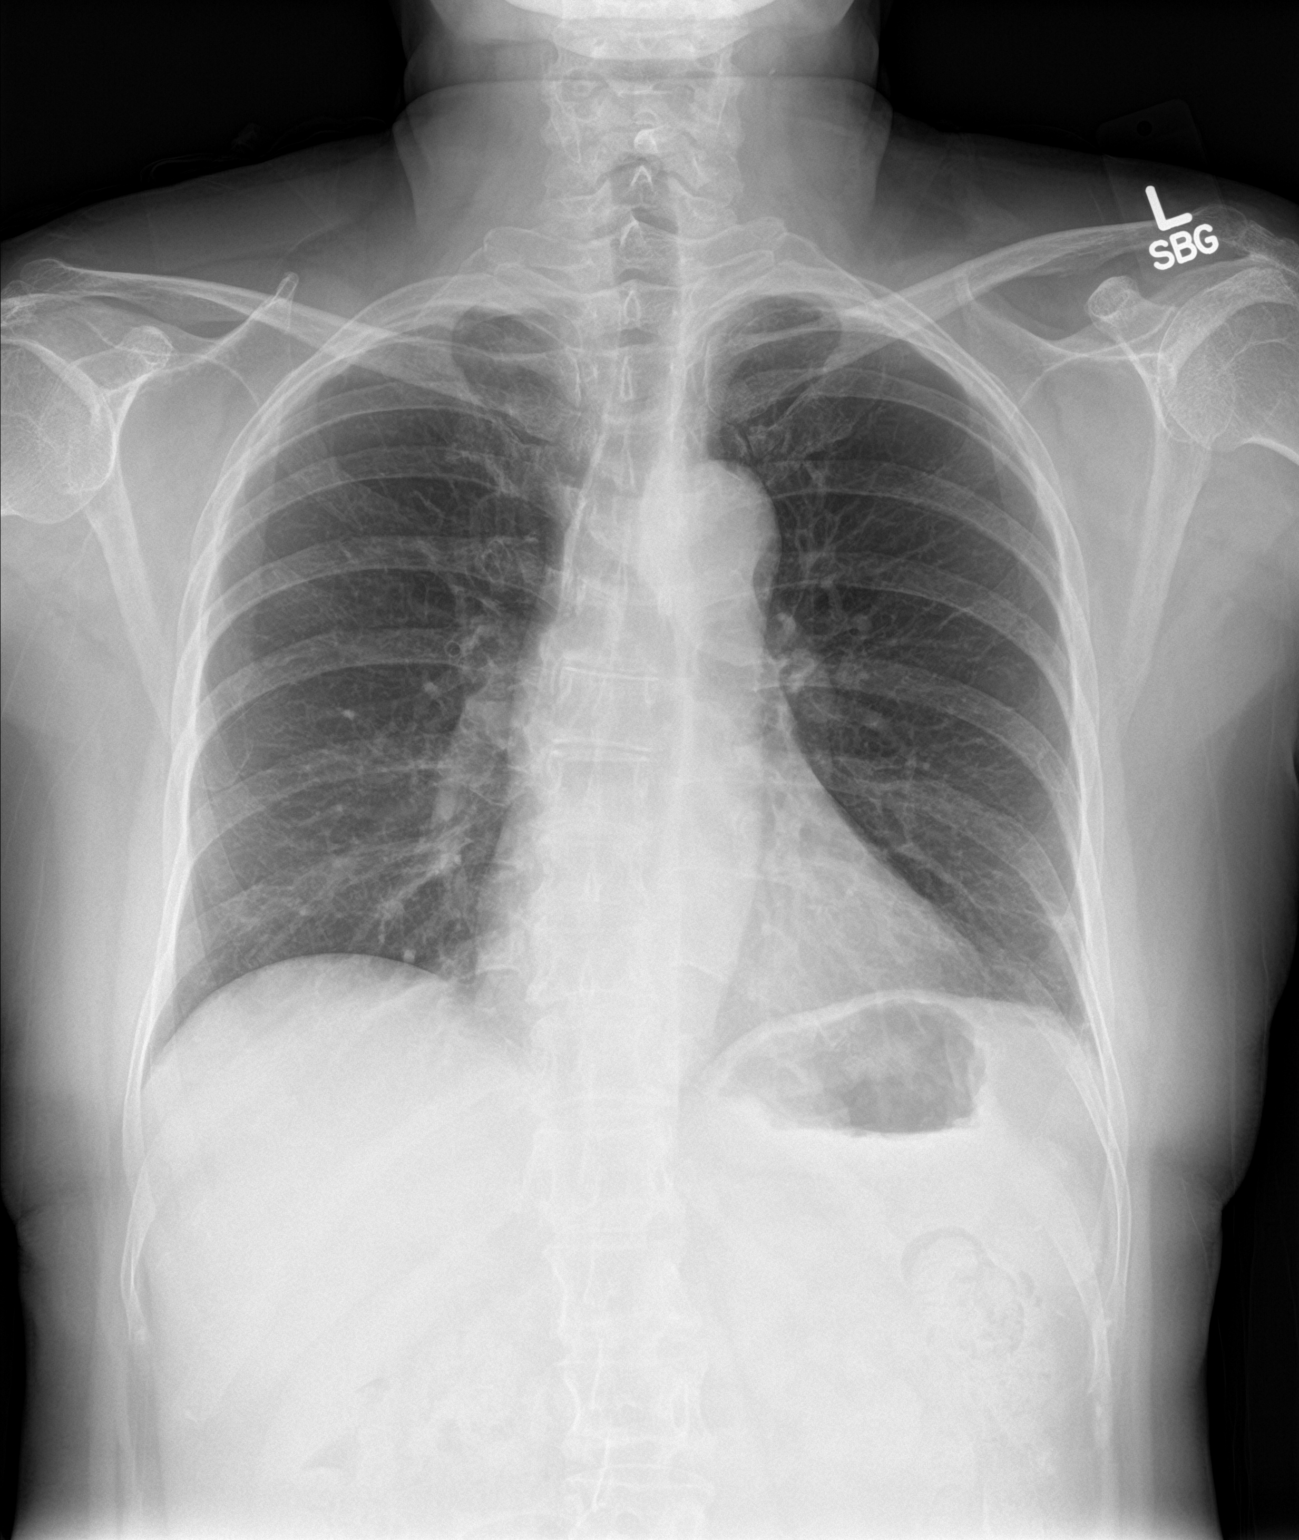

[chest lat]
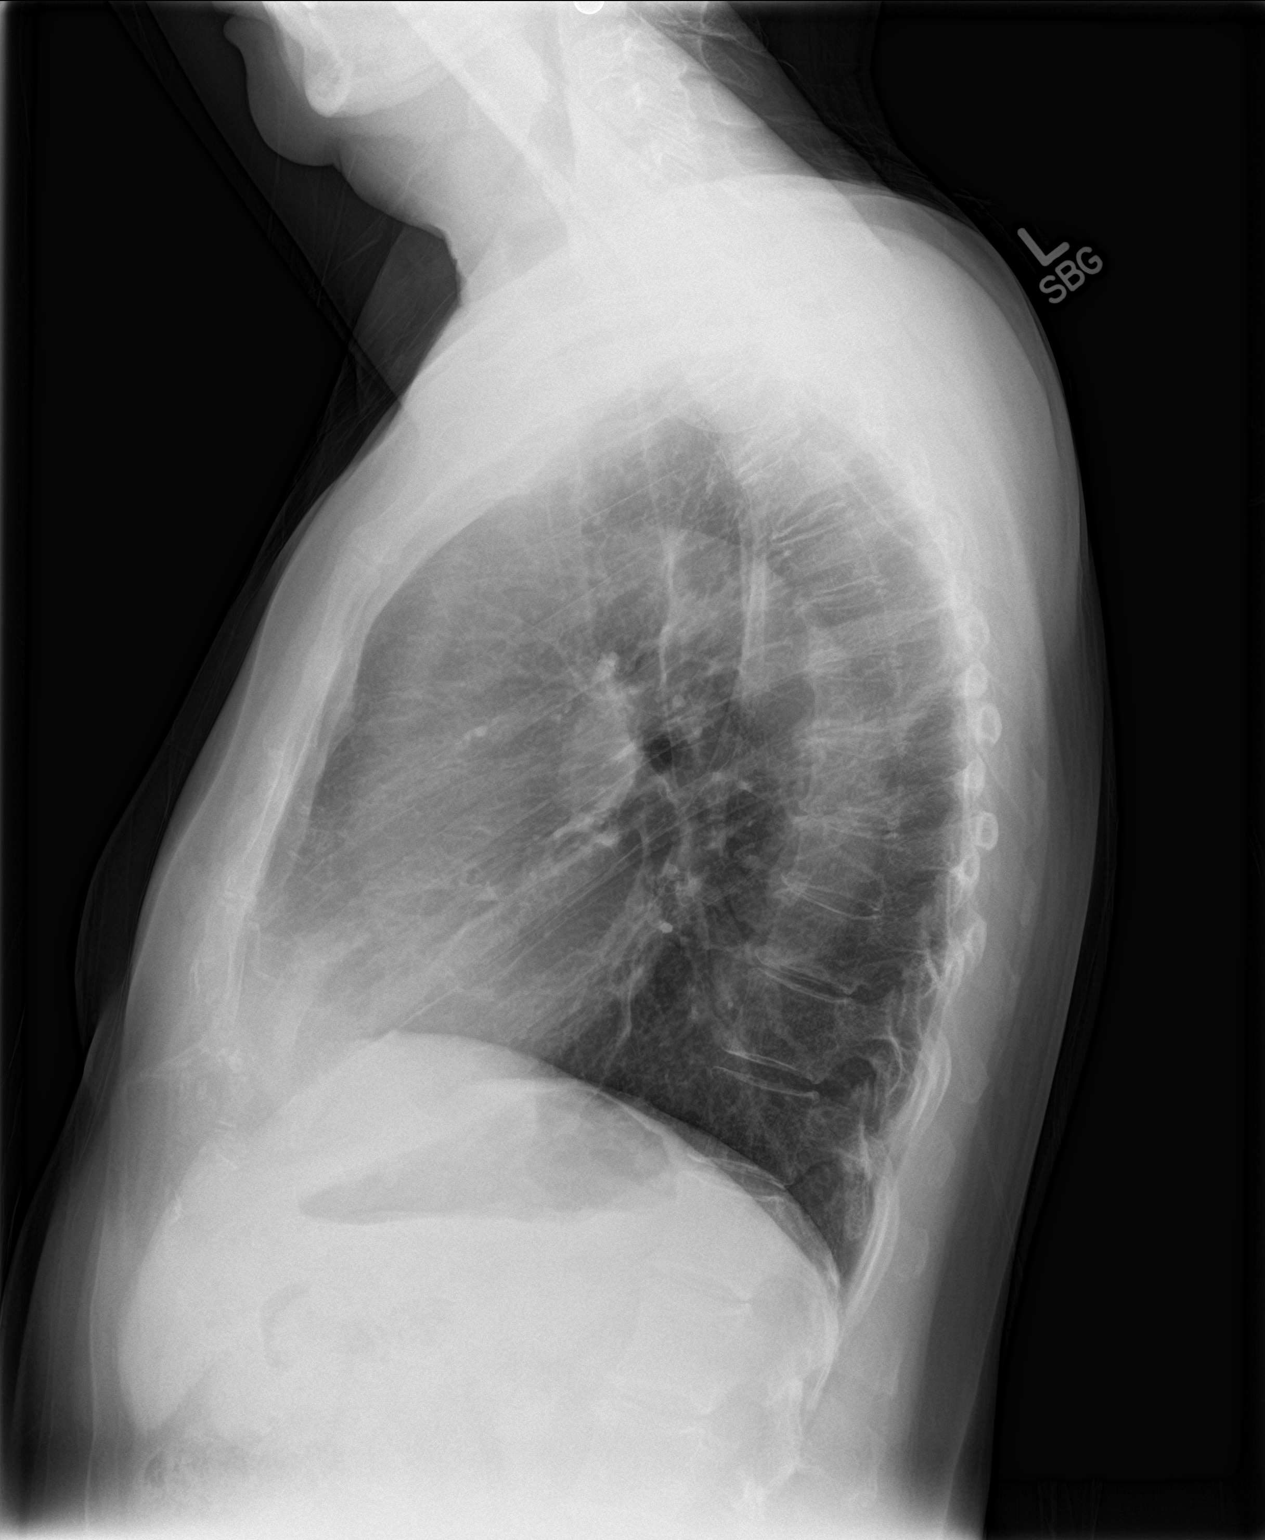

[2 of 2 positions shown; findings below may reference images not displayed]

FINDINGS: The lungs are adequately inflated. There is stable scarring in the
left lower lobe laterally. The heart is top-normal in size and
stable. The pulmonary vascularity is normal. The mediastinum is
normal in width. There is calcification in the wall of the thoracic
aorta. There is no pleural effusion. There is gentle thoracic
dextrocurvature centered at approximately T9.
IMPRESSION: There is no active cardiopulmonary disease. Minimal nonspecific
scarring in the left lower lobe laterally.

Aortic atherosclerosis.

## 2017-12-14 MED ORDER — FREESTYLE LIBRE 14 DAY SENSOR MISC: 3.0000 | 3 refills | Status: DC

## 2017-12-14 MED ORDER — ONETOUCH ULTRASOFT LANCETS MISC: 3 refills | Status: DC

## 2017-12-14 MED ORDER — FREESTYLE LIBRE 14 DAY READER DEVI: 1.0000 | Freq: Once | 0 refills | Status: AC

## 2017-12-14 MED ORDER — ONETOUCH VERIO FLEX SYSTEM W/DEVICE KIT: PACK | 0 refills | Status: AC

## 2017-12-14 MED ORDER — GLUCOSE BLOOD VI STRP: ORAL_STRIP | 3 refills | Status: DC

## 2017-12-14 NOTE — Addendum Note (Signed)
Addended by: Agnes Lawrence on: 12/14/2017 05:20 PM   Modules accepted: Orders

## 2017-12-14 NOTE — Patient Instructions (Signed)
BEFORE YOU LEAVE: -phq9 in epic -labs -follow up: 4 months   CALL your gastroenterologist tomorrow to schedule your colon cancer screening  We have ordered labs or studies at this visit. It can take up to 1-2 weeks for results and processing. IF results require follow up or explanation, we will call you with instructions. Clinically stable results will be released to your [facility]. If you have not heard from [place] or cannot find your results in [facility] in 2 weeks please contact our office at [phone].  If you are not yet signed up for [employer], please consider signing up.    Preventive Care 40-64 Years, Female Preventive care refers to lifestyle choices and visits with your health care provider that can promote health and wellness. What does preventive care include?  A yearly physical exam. This is also called an annual well check.  Dental exams once or twice a year.  Routine eye exams. Ask your health care provider how often you should have your eyes checked.  Personal lifestyle choices, including: ? Daily care of your teeth and gums. ? Regular physical activity. ? Eating a healthy diet. ? Avoiding tobacco and drug use. ? Limiting alcohol use. ? Practicing safe sex. ? Taking low-dose aspirin daily starting at age 27. ? Taking vitamin and mineral supplements as recommended by your health care provider. What happens during an annual well check? The services and screenings done by your health care provider during your annual well check will depend on your age, overall health, lifestyle risk factors, and family history of disease. Counseling Your health care provider may ask you questions about your:  Alcohol use.  Tobacco use.  Drug use.  Emotional well-being.  Home and relationship well-being.  Sexual activity.  Eating habits.  Work and work Statistician.  Method of birth control.  Menstrual cycle.  Pregnancy history.  Screening You may have the following tests  or measurements:  Height, weight, and BMI.  Blood pressure.  Lipid and cholesterol levels. These may be checked every 5 years, or more frequently if you are over 32 years old.  Skin check.  Lung cancer screening. You may have this screening every year starting at age 46 if you have a 30-pack-year history of smoking and currently smoke or have quit within the past 15 years.  Fecal occult blood test (FOBT) of the stool. You may have this test every year starting at age 6.  Flexible sigmoidoscopy or colonoscopy. You may have a sigmoidoscopy every 5 years or a colonoscopy every 10 years starting at age 10.  Hepatitis C blood test.  Hepatitis B blood test.  Sexually transmitted disease (STD) testing.  Diabetes screening. This is done by checking your blood sugar (glucose) after you have not eaten for a while (fasting). You may have this done every 1-3 years.  Mammogram. This may be done every 1-2 years. Talk to your health care provider about when you should start having regular mammograms. This may depend on whether you have a family history of breast cancer.  BRCA-related cancer screening. This may be done if you have a family history of breast, ovarian, tubal, or peritoneal cancers.  Pelvic exam and Pap test. This may be done every 3 years starting at age 89. Starting at age 33, this may be done every 5 years if you have a Pap test in combination with an HPV test.  Bone density scan. This is done to screen for osteoporosis. You may have this scan if you are  at high risk for osteoporosis.  Discuss your test results, treatment options, and if necessary, the need for more tests with your health care provider. Vaccines Your health care provider may recommend certain vaccines, such as:  Influenza vaccine. This is recommended every year.  Tetanus, diphtheria, and acellular pertussis (Tdap, Td) vaccine. You may need a Td booster every 10 years.  Varicella vaccine. You may need this if  you have not been vaccinated.  Zoster vaccine. You may need this after age 4.  Measles, mumps, and rubella (MMR) vaccine. You may need at least one dose of MMR if you were born in 1957 or later. You may also need a second dose.  Pneumococcal 13-valent conjugate (PCV13) vaccine. You may need this if you have certain conditions and were not previously vaccinated.  Pneumococcal polysaccharide (PPSV23) vaccine. You may need one or two doses if you smoke cigarettes or if you have certain conditions.  Meningococcal vaccine. You may need this if you have certain conditions.  Hepatitis A vaccine. You may need this if you have certain conditions or if you travel or work in places where you may be exposed to hepatitis A.  Hepatitis B vaccine. You may need this if you have certain conditions or if you travel or work in places where you may be exposed to hepatitis B.  Haemophilus influenzae type b (Hib) vaccine. You may need this if you have certain conditions.  Talk to your health care provider about which screenings and vaccines you need and how often you need them. This information is not intended to replace advice given to you by your health care provider. Make sure you discuss any questions you have with your health care provider. Document Released: 01/18/2015 Document Revised: 09/11/2015 Document Reviewed: 10/23/2014 Elsevier Interactive Patient Education  [employer].

## 2017-12-14 NOTE — Progress Notes (Addendum)
HPI:  Using dictation device. Unfortunately this device frequently misinterprets words/phrases.  Here for CPE:  -Concerns and/or follow up today:   Victoria Neal is a pleasant 62 year old with a past medical history significant for diabetes, hypertension and hyperlipidemia.  She also has a history of acid reflux.  She reports she recently changed to a much healthier diet and mainly vegan.  Is eating a lot of vegetables, beans, nuts and is avoiding processed foods and a lot of starches and sweets.  She reports she knows she is due for her colonoscopy.  She had 1 in 2008 that was normal.  She reports she has a letter to call and has scheduled it several times and then canceled it.  She agrees to set this up.  Reports she had her diabetic eye exam and was told that her eyes looked great.  -Diet: variety of foods, balance and well rounded, larger portion sizes -Exercise: no regular exercise -Taking folic acid, vitamin D or calcium: no -Diabetes and Dyslipidemia Screening: Diabetes and blood pressure labs today  -Vaccines: see vaccine section EPIC -pap history: Normal Pap with negative HPV 01/2015 -FDLMP: see nursing notes -sexual activity: Not discussed -wants STI testing (Hep C if born [birth year]-65): no -FH breast, colon or ovarian ca: see FH Last mammogram: Normal mammogram 05/2017 Last colon cancer screening: Colonoscopy 04/2006 -reports has letter and has scheduled her colonoscopy several times, but then had to cancel, she agrees to reschedule Breast Ca Risk Assessment: see family history and pt history DEXA (>/= 65): Not applicable  -Alcohol, Tobacco, drug use: see social history  Review of Systems - no fevers, unintentional weight loss, vision loss, hearing loss, chest pain, sob, hemoptysis, melena, hematochezia, hematuria, genital discharge, changing or concerning skin lesions, bleeding, bruising, loc, thoughts of self harm or SI  Past Medical History:  Diagnosis Date  . Allergic  rhinitis   . Allergy   . Arthritis   . Cataract   . DM (diabetes mellitus) ([place])   . GERD (gastroesophageal reflux disease)   . Glaucoma    ? of, but seeing optho and told recently not glaucoma  . High cholesterol   . HTN (hypertension)   . Internal hemorrhoids   . Osteoporosis    in 2010, has refused bisphosphnate treatment or treament other then vit d and calcium  . Panic anxiety syndrome 01/03/2013  . Pure hyperglyceridemia     Past Surgical History:  Procedure Laterality Date  . CESAREAN SECTION    . COLONOSCOPY  05/03/2006   internal hemorrhoids    Family History  Problem Relation Age of Onset  . Coronary artery disease Maternal Uncle   . Lung cancer Sister   . Cancer Sister        lung   . Pancreatic cancer Sister   . Breast cancer Maternal Aunt   . Colon cancer Mother        deceased age 11  . Cancer Mother 41  . Hypertension Mother   . Diabetes Father   . Hypertension Father   . Diabetes Paternal Aunt   . Esophageal cancer Neg Hx   . Liver cancer Neg Hx   . Rectal cancer Neg Hx   . Stomach cancer Neg Hx     Social History   Socioeconomic History  . Marital status: Married    Spouse name: Not on file  . Number of children: Not on file  . Years of education: Not on file  . Highest education level: Not on  file  Occupational History  . Not on file  Social Needs  . Financial resource strain: Not on file  . Food insecurity:    Worry: Not on file    Inability: Not on file  . Transportation needs:    Medical: Not on file    Non-medical: Not on file  Tobacco Use  . Smoking status: Never Smoker  . Smokeless tobacco: Never Used  Substance and Sexual Activity  . Alcohol use: Yes    Comment: occ - very rare  . Drug use: No  . Sexual activity: Not on file  Lifestyle  . Physical activity:    Days per week: Not on file    Minutes per session: Not on file  . Stress: Not on file  Relationships  . Social connections:    Talks on phone: Not on file     Gets together: Not on file    Attends religious service: Not on file    Active member of club or organization: Not on file    Attends meetings of clubs or organizations: Not on file    Relationship status: Not on file  Other Topics Concern  . Not on file  Social History Narrative   Work or School: 2nd grade at [employer] Situation: lives with husband and son - has 4 children, son in Facilities manager school, son animation, daughter is Pension scheme manager      Spiritual Beliefs: Baha'i faith - all religions are one, prayer based, no health care restrictions      Lifestyle: no CV exercise; diet is good              Current Outpatient Medications:  .  amLODipine (NORVASC) 5 MG tablet, TAKE 1 TABLET BY MOUTH ONCE DAILY, Disp: 90 tablet, Rfl: 0 .  FREESTYLE LITE test strip, , Disp: , Rfl:  .  Lancets (FREESTYLE) lancets, , Disp: , Rfl:  .  losartan (COZAAR) 25 MG tablet, TAKE ONE TABLET BY MOUTH ONCE DAILY, Disp: 90 tablet, Rfl: 1 .  metFORMIN (GLUCOPHAGE) 500 MG tablet, Take 2 tablets (1,000 mg total) by mouth 2 (two) times daily with a meal., Disp: 180 tablet, Rfl: 0 .  polyethylene glycol powder (GLYCOLAX/MIRALAX) powder, Take 1 Container by mouth once., Disp: , Rfl:  .  rosuvastatin (CRESTOR) 10 MG tablet, TAKE 1 TABLET BY MOUTH ONCE DAILY, Disp: 90 tablet, Rfl: 3 .  Specialty Vitamins Products (ONE-A-DAY BONE STRENGTH PO), Take 1 tablet by mouth daily. , Disp: , Rfl:   EXAM:  Vitals:   12/14/17 1645  BP: 120/70  Pulse: 74  Temp: 98.7 F (37.1 C)    GENERAL: vitals reviewed and listed below, alert, oriented, appears well hydrated and in no acute distress  HEENT: head atraumatic, PERRLA, normal appearance of eyes, ears, nose and mouth. moist mucus membranes.  NECK: supple, no masses or lymphadenopathy  LUNGS: clear to auscultation bilaterally, no rales, rhonchi or wheeze  CV: HRRR, no peripheral edema or cyanosis, normal pedal pulses  ABDOMEN: bowel sounds normal, soft, non  tender to palpation, no masses, no rebound or guarding  GU/BREAST: Does self breast exams and mammograms, deferred today  SKIN: no rash or abnormal lesions  MS: normal gait, moves all extremities normally  NEURO: normal gait, speech and thought processing grossly intact, muscle tone grossly intact throughout  PSYCH: normal affect, pleasant and cooperative  ASSESSMENT AND PLAN:  Discussed the following assessment and plan:  PREVENTIVE EXAM: -Discussed and advised  all [place] preventive services health task force level A and B recommendations for age, sex and risks. -Advised at least 150 minutes of exercise per week and a healthy diet with avoidance of (less then 1 serving per week) processed foods, white starches, red meat, fast foods and sweets and consisting of: * 5-9 servings of fresh fruits and vegetables (not corn or potatoes) *nuts and seeds, beans *olives and olive oil *lean meats such as fish and white chicken  *whole grains -labs, studies and vaccines per orders this encounter -advised colon cancer screening - she agrees to schedule as she reports is late on doing as canceled several times   Patient Instructions  BEFORE YOU LEAVE: -phq9 in epic -labs -follow up: 4 months   CALL your gastroenterologist tomorrow to schedule your colon cancer screening  We have ordered labs or studies at this visit. It can take up to 1-2 weeks for results and processing. IF results require follow up or explanation, we will call you with instructions. Clinically stable results will be released to your [facility]. If you have not heard from [place] or cannot find your results in [facility] in 2 weeks please contact our office at [phone].  If you are not yet signed up for [facility], please consider signing up.    Preventive Care 40-64 Years, Female Preventive care refers to lifestyle choices and visits with your health care provider that can promote health and wellness. What does preventive care  include?  A yearly physical exam. This is also called an annual well check.  Dental exams once or twice a year.  Routine eye exams. Ask your health care provider how often you should have your eyes checked.  Personal lifestyle choices, including: ? Daily care of your teeth and gums. ? Regular physical activity. ? Eating a healthy diet. ? Avoiding tobacco and drug use. ? Limiting alcohol use. ? Practicing safe sex. ? Taking low-dose aspirin daily starting at age 36. ? Taking vitamin and mineral supplements as recommended by your health care provider. What happens during an annual well check? The services and screenings done by your health care provider during your annual well check will depend on your age, overall health, lifestyle risk factors, and family history of disease. Counseling Your health care provider may ask you questions about your:  Alcohol use.  Tobacco use.  Drug use.  Emotional well-being.  Home and relationship well-being.  Sexual activity.  Eating habits.  Work and work Statistician.  Method of birth control.  Menstrual cycle.  Pregnancy history.  Screening You may have the following tests or measurements:  Height, weight, and BMI.  Blood pressure.  Lipid and cholesterol levels. These may be checked every 5 years, or more frequently if you are over 82 years old.  Skin check.  Lung cancer screening. You may have this screening every year starting at age 57 if you have a 30-pack-year history of smoking and currently smoke or have quit within the past 15 years.  Fecal occult blood test (FOBT) of the stool. You may have this test every year starting at age 42.  Flexible sigmoidoscopy or colonoscopy. You may have a sigmoidoscopy every 5 years or a colonoscopy every 10 years starting at age 5.  Hepatitis C blood test.  Hepatitis B blood test.  Sexually transmitted disease (STD) testing.  Diabetes screening. This is done by checking your  blood sugar (glucose) after you have not eaten for a while (fasting). You may have this done every 1-3  years.  Mammogram. This may be done every 1-2 years. Talk to your health care provider about when you should start having regular mammograms. This may depend on whether you have a family history of breast cancer.  BRCA-related cancer screening. This may be done if you have a family history of breast, ovarian, tubal, or peritoneal cancers.  Pelvic exam and Pap test. This may be done every 3 years starting at age 37. Starting at age 35, this may be done every 5 years if you have a Pap test in combination with an HPV test.  Bone density scan. This is done to screen for osteoporosis. You may have this scan if you are at high risk for osteoporosis.  Discuss your test results, treatment options, and if necessary, the need for more tests with your health care provider. Vaccines Your health care provider may recommend certain vaccines, such as:  Influenza vaccine. This is recommended every year.  Tetanus, diphtheria, and acellular pertussis (Tdap, Td) vaccine. You may need a Td booster every 10 years.  Varicella vaccine. You may need this if you have not been vaccinated.  Zoster vaccine. You may need this after age 16.  Measles, mumps, and rubella (MMR) vaccine. You may need at least one dose of MMR if you were born in 1957 or later. You may also need a second dose.  Pneumococcal 13-valent conjugate (PCV13) vaccine. You may need this if you have certain conditions and were not previously vaccinated.  Pneumococcal polysaccharide (PPSV23) vaccine. You may need one or two doses if you smoke cigarettes or if you have certain conditions.  Meningococcal vaccine. You may need this if you have certain conditions.  Hepatitis A vaccine. You may need this if you have certain conditions or if you travel or work in places where you may be exposed to hepatitis A.  Hepatitis B vaccine. You may need this if  you have certain conditions or if you travel or work in places where you may be exposed to hepatitis B.  Haemophilus influenzae type b (Hib) vaccine. You may need this if you have certain conditions.  Talk to your health care provider about which screenings and vaccines you need and how often you need them. This information is not intended to replace advice given to you by your health care provider. Make sure you discuss any questions you have with your health care provider. Document Released: 01/18/2015 Document Revised: 09/11/2015 Document Reviewed: 10/23/2014 Elsevier Interactive Patient Education  2018 [employer].         No follow-ups on file.  Lucretia Kern, DO

## 2017-12-15 LAB — HEMOGLOBIN A1C: HEMOGLOBIN A1C: 6.7 % — AB (ref 4.6–6.5)

## 2017-12-15 LAB — BASIC METABOLIC PANEL
BUN: 12 mg/dL (ref 6–23)
CALCIUM: 9.9 mg/dL (ref 8.4–10.5)
CHLORIDE: 104 meq/L (ref 96–112)
CO2: 30 mEq/L (ref 19–32)
CREATININE: 0.76 mg/dL (ref 0.40–1.20)
GFR: 81.43 mL/min (ref >60.00)
Glucose, Bld: 119 mg/dL — ABNORMAL HIGH (ref 70–99)
Potassium: 4.4 mEq/L (ref 3.5–5.1)
Sodium: 141 mEq/L (ref 135–145)

## 2017-12-15 LAB — CBC
HEMATOCRIT: 40.5 % (ref 36.0–46.0)
HEMOGLOBIN: 13.5 g/dL (ref 12.0–15.0)
MCHC: 33.4 g/dL (ref 30.0–36.0)
MCV: 86.4 fl (ref 78.0–100.0)
Platelets: 280 10*3/uL (ref 150.0–400.0)
RBC: 4.69 Mil/uL (ref 3.87–5.11)
RDW: 13.9 % (ref 11.5–15.5)
WBC: 4.7 10*3/uL (ref 4.0–10.5)
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
## 2018-01-17 ENCOUNTER — Other Ambulatory Visit: Payer: Self-pay | Admitting: Family Medicine

## 2018-02-14 ENCOUNTER — Other Ambulatory Visit: Payer: Self-pay | Admitting: Family Medicine

## 2018-03-19 ENCOUNTER — Other Ambulatory Visit: Payer: Self-pay | Admitting: Family Medicine

## 2018-03-19 DIAGNOSIS — E119 Type 2 diabetes mellitus without complications: Secondary | ICD-10-CM

## 2018-05-23 ENCOUNTER — Other Ambulatory Visit: Payer: Self-pay | Admitting: Family Medicine

## 2018-06-19 ENCOUNTER — Other Ambulatory Visit: Payer: Self-pay | Admitting: Family Medicine

## 2018-07-23 ENCOUNTER — Other Ambulatory Visit: Payer: Self-pay | Admitting: Family Medicine

## 2018-08-06 ENCOUNTER — Other Ambulatory Visit: Payer: Self-pay | Admitting: Family Medicine

## 2018-08-06 DIAGNOSIS — E119 Type 2 diabetes mellitus without complications: Secondary | ICD-10-CM

## 2018-08-22 ENCOUNTER — Other Ambulatory Visit: Payer: Self-pay | Admitting: Family Medicine

## 2018-08-23 ENCOUNTER — Other Ambulatory Visit: Payer: Self-pay | Admitting: Family Medicine

## 2018-08-25 ENCOUNTER — Other Ambulatory Visit: Payer: Self-pay | Admitting: Family Medicine

## 2018-08-25 DIAGNOSIS — Z1231 Encounter for screening mammogram for malignant neoplasm of breast: Secondary | ICD-10-CM

## 2018-08-26 ENCOUNTER — Ambulatory Visit
Admission: RE | Admit: 2018-08-26 | Discharge: 2018-08-26 | Disposition: A | Discharge status: Home / Self Care | Payer: BC Managed Care – PPO | Source: Ambulatory Visit | Attending: Family Medicine | Admitting: Family Medicine

## 2018-08-26 ENCOUNTER — Other Ambulatory Visit: Payer: Self-pay

## 2018-08-26 DIAGNOSIS — Z1231 Encounter for screening mammogram for malignant neoplasm of breast: Secondary | ICD-10-CM

## 2018-09-16 ENCOUNTER — Other Ambulatory Visit: Payer: Self-pay | Admitting: Family Medicine

## 2018-09-17 ENCOUNTER — Other Ambulatory Visit: Payer: Self-pay | Admitting: Family Medicine

## 2018-09-22 ENCOUNTER — Other Ambulatory Visit: Payer: Self-pay | Admitting: *Deleted

## 2018-09-22 MED ORDER — LOSARTAN POTASSIUM 25 MG PO TABS: 25.0000 mg | ORAL_TABLET | Freq: Every day | ORAL | 0 refills | Status: DC

## 2018-09-22 NOTE — Telephone Encounter (Signed)
Rx done. 

## 2018-10-02 ENCOUNTER — Other Ambulatory Visit: Payer: Self-pay | Admitting: Family Medicine

## 2018-10-02 DIAGNOSIS — E119 Type 2 diabetes mellitus without complications: Secondary | ICD-10-CM

## 2018-10-12 ENCOUNTER — Other Ambulatory Visit: Payer: Self-pay | Admitting: Family Medicine

## 2018-10-15 ENCOUNTER — Other Ambulatory Visit: Payer: Self-pay | Admitting: Family Medicine

## 2018-10-23 NOTE — Progress Notes (Deleted)
Victoria Neal DOB: 1955/08/09 Encounter date: 10/24/2018  This is a 63 y.o. female who presents to establish care. No chief complaint on file.  Last visit was CPE with HK in 12/19 History of present illness: [id number] 5mg , losartan 25mg  DMII: metformin 500mg  BID HL: crestor 10mg  daily *** Normal pap, negative HPV 01/2015. Last mammogram 08/2018: negative Colonoscopy:  Past Medical History:  Diagnosis Date  . Allergic rhinitis   . Allergy   . Arthritis   . Cataract   . DM (diabetes mellitus) ([place])   . GERD (gastroesophageal reflux disease)   . Glaucoma    ? of, but seeing optho and told recently not glaucoma  . High cholesterol   . HTN (hypertension)   . Internal hemorrhoids   . Osteoporosis    in 2010, has refused bisphosphnate treatment or treament other then vit d and calcium  . Panic anxiety syndrome 01/03/2013  . Pure hyperglyceridemia    Past Surgical History:  Procedure Laterality Date  . CESAREAN SECTION    . COLONOSCOPY  05/03/2006   internal hemorrhoids   Allergies  Allergen Reactions  . Aspirin     REACTION: Swelling  . Lisinopril Cough  . Statins     Muscle cramps; pravastatin ok   No outpatient medications have been marked as taking for the 10/24/18 encounter (Appointment) with Caren Macadam, MD.   Social History   Tobacco Use  . Smoking status: Never Smoker  . Smokeless tobacco: Never Used  Substance Use Topics  . Alcohol use: Yes    Comment: occ - very rare   Family History  Problem Relation Age of Onset  . Coronary artery disease Maternal Uncle   . Lung cancer Sister   . Cancer Sister        lung   . Pancreatic cancer Sister   . Breast cancer Maternal Aunt   . Colon cancer Mother        deceased age 23  . Cancer Mother 90  . Hypertension Mother   . Diabetes Father   . Hypertension Father   . Diabetes Paternal Aunt   . Esophageal cancer Neg Hx   . Liver cancer Neg Hx   . Rectal cancer Neg Hx   . Stomach cancer Neg Hx       Review of Systems  Objective:  There were no vitals taken for this visit.      BP Readings from Last 3 Encounters:  12/14/17 120/70  11/26/17 114/70  08/03/17 120/78   Wt Readings from Last 3 Encounters:  12/14/17 125 lb 4.8 oz (56.8 kg)  11/26/17 127 lb 3.2 oz (57.7 kg)  08/03/17 130 lb 8 oz (59.2 kg)    Physical Exam  Assessment/Plan:  There are no diagnoses linked to this encounter.  No follow-ups on file.  Micheline Rough, MD

## 2018-10-24 ENCOUNTER — Encounter: Payer: BC Managed Care – PPO | Admitting: Family Medicine

## 2018-10-24 ENCOUNTER — Telehealth: Payer: Self-pay | Admitting: *Deleted

## 2018-10-24 NOTE — Telephone Encounter (Signed)
Noted  

## 2018-10-24 NOTE — Telephone Encounter (Signed)
Copied from [place] [id number]. Topic: Quick Communication - Appointment Cancellation >> Oct 24, 2018  7:47 AM Lennox Solders wrote: Patient called to cancel appointment scheduled for [place]. Patient  HAS NOT rescheduled their appointment. Pt has diarrhea and will kept jan appt with dr Silvio Pate  Route to department's [facility] pool.

## 2018-10-28 ENCOUNTER — Other Ambulatory Visit: Payer: Self-pay | Admitting: Family Medicine

## 2018-11-07 ENCOUNTER — Other Ambulatory Visit: Payer: Self-pay | Admitting: Family Medicine

## 2018-11-09 ENCOUNTER — Other Ambulatory Visit: Payer: Self-pay | Admitting: Family Medicine

## 2018-12-08 ENCOUNTER — Other Ambulatory Visit: Payer: Self-pay | Admitting: Family Medicine

## 2018-12-08 DIAGNOSIS — E1159 Type 2 diabetes mellitus with other circulatory complications: Secondary | ICD-10-CM

## 2018-12-08 DIAGNOSIS — I10 Essential (primary) hypertension: Secondary | ICD-10-CM

## 2019-01-10 ENCOUNTER — Encounter: Payer: Self-pay | Admitting: Internal Medicine

## 2019-01-10 ENCOUNTER — Other Ambulatory Visit: Payer: Self-pay

## 2019-01-10 ENCOUNTER — Other Ambulatory Visit: Payer: Self-pay | Admitting: Family Medicine

## 2019-01-10 ENCOUNTER — Ambulatory Visit (INDEPENDENT_AMBULATORY_CARE_PROVIDER_SITE_OTHER): Payer: Medicare HMO | Admitting: Internal Medicine

## 2019-01-10 VITALS — BP 130/86 | HR 66 | Temp 98.7°F | Ht 63.0 in | Wt 134.0 lb

## 2019-01-10 DIAGNOSIS — I10 Essential (primary) hypertension: Secondary | ICD-10-CM

## 2019-01-10 DIAGNOSIS — E785 Hyperlipidemia, unspecified: Secondary | ICD-10-CM | POA: Diagnosis not present

## 2019-01-10 DIAGNOSIS — E119 Type 2 diabetes mellitus without complications: Secondary | ICD-10-CM

## 2019-01-10 DIAGNOSIS — K2100 Gastro-esophageal reflux disease with esophagitis, without bleeding: Secondary | ICD-10-CM

## 2019-01-10 DIAGNOSIS — Z23 Encounter for immunization: Secondary | ICD-10-CM

## 2019-01-10 DIAGNOSIS — E1159 Type 2 diabetes mellitus with other circulatory complications: Secondary | ICD-10-CM

## 2019-01-10 DIAGNOSIS — K219 Gastro-esophageal reflux disease without esophagitis: Secondary | ICD-10-CM | POA: Insufficient documentation

## 2019-01-10 LAB — CBC
HCT: 41.3 % (ref 36.0–46.0)
Hemoglobin: 13.5 g/dL (ref 12.0–15.0)
MCHC: 32.8 g/dL (ref 30.0–36.0)
MCV: 88 fl (ref 78.0–100.0)
Platelets: 264 K/uL (ref 150.0–400.0)
RBC: 4.69 Mil/uL (ref 3.87–5.11)
RDW: 14 % (ref 11.5–15.5)
WBC: 4.5 K/uL (ref 4.0–10.5)

## 2019-01-10 LAB — COMPREHENSIVE METABOLIC PANEL
ALT: 15 U/L (ref 0–35)
AST: 16 U/L (ref 0–37)
Albumin: 4.5 g/dL (ref 3.5–5.2)
Alkaline Phosphatase: 78 U/L (ref 39–117)
BUN: 12 mg/dL (ref 6–23)
CO2: 29 mEq/L (ref 19–32)
Calcium: 9.5 mg/dL (ref 8.4–10.5)
Chloride: 105 mEq/L (ref 96–112)
Creatinine, Ser: 0.74 mg/dL (ref 0.40–1.20)
GFR: 78.74 mL/min (ref >60.00)
Glucose, Bld: 144 mg/dL — ABNORMAL HIGH (ref 70–99)
Potassium: 4.2 mEq/L (ref 3.5–5.1)
Sodium: 142 mEq/L (ref 135–145)
Total Bilirubin: 0.5 mg/dL (ref 0.2–1.2)
Total Protein: 7.1 g/dL (ref 6.0–8.3)

## 2019-01-10 LAB — LIPID PANEL
Cholesterol: 205 mg/dL — ABNORMAL HIGH (ref 0–200)
HDL: 83.1 mg/dL (ref >39.00)
LDL Cholesterol: 90 mg/dL (ref 0–99)
NonHDL: 121.76
Total CHOL/HDL Ratio: 2
Triglycerides: 160 mg/dL — ABNORMAL HIGH (ref 0.0–149.0)
VLDL: 32 mg/dL (ref 0.0–40.0)

## 2019-01-10 LAB — T4, FREE: Free T4: 0.8 ng/dL (ref 0.60–1.60)

## 2019-01-10 LAB — HM DIABETES FOOT EXAM

## 2019-01-10 LAB — HEMOGLOBIN A1C: Hgb A1c MFr Bld: 7.2 % — ABNORMAL HIGH (ref 4.6–6.5)

## 2019-01-10 MED ORDER — LOSARTAN POTASSIUM 25 MG PO TABS: 25.0000 mg | ORAL_TABLET | Freq: Every day | ORAL | 3 refills | Status: DC

## 2019-01-10 MED ORDER — ROSUVASTATIN CALCIUM 10 MG PO TABS: 10.0000 mg | ORAL_TABLET | Freq: Every day | ORAL | 3 refills | Status: DC

## 2019-01-10 MED ORDER — OMEPRAZOLE 20 MG PO CPDR: 20.0000 mg | DELAYED_RELEASE_CAPSULE | Freq: Every day | ORAL | 3 refills | Status: DC

## 2019-01-10 MED ORDER — AMLODIPINE BESYLATE 5 MG PO TABS: 5.0000 mg | ORAL_TABLET | Freq: Every day | ORAL | 3 refills | Status: DC

## 2019-01-10 MED ORDER — METFORMIN HCL 500 MG PO TABS: 500.0000 mg | ORAL_TABLET | Freq: Every day | ORAL | 3 refills | Status: DC

## 2019-01-10 NOTE — Assessment & Plan Note (Signed)
Has cut the metformin down herself to 500mg  daily Lab Results  Component Value Date   HGBA1C 6.7 (H) 12/14/2017   Will recheck today Has HTN but this is controlled On statin Keeps up with eye exams

## 2019-01-10 NOTE — Assessment & Plan Note (Signed)
No problems with statin Secondary control due to diabetes Due for labs today

## 2019-01-10 NOTE — Progress Notes (Signed)
Subjective:    Patient ID: Victoria Neal, female    DOB: July 29, 1955, 64 y.o.   MRN: [id number]  HPI Here to establish care--transferring care (I see her husband and son)  This visit occurred during the SARS-CoV-2 public health emergency.  Safety protocols were in place, including screening questions prior to the visit, additional usage of staff PPE, and extensive cleaning of exam room while observing appropriate contact time as indicated for disinfecting solutions.   Diabetes for some time Generally good control--has cut her metformin to 542m daily Checks sugars about once a week--usually in 120's No foot numbness or pain Keeps up eye doctor  HTN Rx No regular headaches, dizziness upon standing No chest pain (other than from the reflux) or SOB  Lots of acid reflux--mostly at night nexium at times---OTC. Unclear about how well it worked Trouble eating due to this No dysphagia  Current Outpatient Medications on File Prior to Visit  Medication Sig Dispense Refill  . amLODipine (NORVASC) 5 MG tablet TAKE 1 TABLET BY MOUTH EVERY DAY 30 tablet 1  . Blood Glucose Monitoring Suppl ([place] w/Device KIT Use as directed to check blood sugar once a day 1 kit 0  . FREESTYLE LITE test strip     . glucose blood (ONETOUCH VERIO) test strip Use as instructed 100 each 3  . Lancets (FREESTYLE) lancets     . Lancets (ONETOUCH ULTRASOFT) lancets Use as instructed 100 each 3  . losartan (COZAAR) 25 MG tablet TAKE 1 TABLET BY MOUTH EVERY DAY *NEED OFFICE VISIT* 30 tablet 1  . metFORMIN (GLUCOPHAGE) 500 MG tablet TAKE 2 TABLETS TWICE A DAY WITH A MEAL (Patient taking differently: 500 mg daily with breakfast. ) 180 tablet 0  . rosuvastatin (CRESTOR) 10 MG tablet TAKE 1 TABLET BY MOUTH EVERY DAY 30 tablet 3  . Specialty Vitamins Products (ONE-A-DAY BONE STRENGTH PO) Take 1 tablet by mouth daily.      No current facility-administered medications on file prior to visit.     Allergies  Allergen Reactions  . Aspirin     REACTION: Swelling  . Lisinopril Cough  . Statins     Muscle cramps; pravastatin ok    Past Medical History:  Diagnosis Date  . Allergic rhinitis   . Allergy   . Arthritis   . Cataract   . DM [place]diabetes mellitus) ([place]   . GERD (gastroesophageal reflux disease)   . Glaucoma    ? of, but seeing optho and told recently not glaucoma  . High cholesterol   . HTN (hypertension)   . Internal hemorrhoids   . Osteoporosis    in 2010, has refused bisphosphnate treatment or treament other then vit d and calcium  . Panic anxiety syndrome 01/03/2013  . Pure hyperglyceridemia     Past Surgical History:  Procedure Laterality Date  . CESAREAN SECTION    . COLONOSCOPY  05/03/2006   internal hemorrhoids    Family History  Problem Relation Age of Onset  . Coronary artery disease Maternal Uncle   . Lung cancer Sister   . Cancer Sister        lung   . Pancreatic cancer Sister   . Breast cancer Maternal Aunt   . Colon cancer Mother        deceased age 66 . Cancer Mother 884 . Hypertension Mother   . Diabetes Father   . Hypertension Father   . Diabetes Paternal Aunt   . Esophageal cancer  Neg Hx   . Liver cancer Neg Hx   . Rectal cancer Neg Hx   . Stomach cancer Neg Hx     Social History   Socioeconomic History  . Marital status: Married    Spouse name: Not on file  . Number of children: 4  . Years of education: Not on file  . Highest education level: Not on file  Occupational History  . Occupation: Pharmacist, hospital-- elementary    Comment: Retired  Tobacco Use  . Smoking status: Never Smoker  . Smokeless tobacco: Never Used  Substance and Sexual Activity  . Alcohol use: Yes    Comment: occ - very rare  . Drug use: No  . Sexual activity: Not on file  Other Topics Concern  . Not on file  Social History Narrative   Work or School: 2nd grade at [employer] Situation: lives with husband and son - has 4 children,  son in Facilities manager school, son animation, daughter is Pension scheme manager      Spiritual Beliefs: Baha'i faith - all religions are one, prayer based, no health care restrictions      No living will   Husband, then children, as health care POA   Would accept resuscitation         Social Determinants of Health   Financial Resource Strain:   . Difficulty of Paying Living Expenses: Not on file  Food Insecurity:   . Worried About Charity fundraiser in the Last Year: Not on file  . Ran Out of Food in the Last Year: Not on file  Transportation Needs:   . Lack of Transportation (Medical): Not on file  . Lack of Transportation (Non-Medical): Not on file  Physical Activity:   . Days of Exercise per Week: Not on file  . Minutes of Exercise per Session: Not on file  Stress:   . Feeling of Stress : Not on file  Social Connections:   . Frequency of Communication with Friends and Family: Not on file  . Frequency of Social Gatherings with Friends and Family: Not on file  . Attends Religious Services: Not on file  . Active Member of Clubs or Organizations: Not on file  . Attends Archivist Meetings: Not on file  . Marital Status: Not on file  Intimate Partner Violence:   . Fear of Current or Ex-Partner: Not on file  . Emotionally Abused: Not on file  . Physically Abused: Not on file  . Sexually Abused: Not on file   Review of Systems Appetite is fine Weight stable Sleeps well    Objective:   Physical Exam         Assessment & Plan:

## 2019-01-10 NOTE — Addendum Note (Signed)
Addended by: Pilar Grammes on: 01/10/2019 05:12 PM   Modules accepted: Orders

## 2019-01-10 NOTE — Assessment & Plan Note (Signed)
Persistent symptoms Some swallowing issues as well Will start omeprazole 20mg  daily--consider GI evaluation if dysphagia persists

## 2019-01-10 NOTE — Assessment & Plan Note (Signed)
BP Readings from Last 3 Encounters:  01/10/19 130/86  12/14/17 120/70  11/26/17 114/70   Is on ARB and amlodipine Good control Due for all labs

## 2019-02-06 ENCOUNTER — Ambulatory Visit: Payer: BC Managed Care – PPO

## 2019-02-06 ENCOUNTER — Ambulatory Visit: Payer: Medicare HMO | Attending: Internal Medicine

## 2019-02-06 DIAGNOSIS — Z23 Encounter for immunization: Secondary | ICD-10-CM | POA: Insufficient documentation

## 2019-02-06 NOTE — Progress Notes (Signed)
   Covid-19 Vaccination Clinic  Name:  Victoria Neal    MRN: [id number] DOB: 1955/11/28  02/06/2019  [name] was observed post Covid-19 immunization for 15 minutes without incidence. She was provided with Vaccine Information Sheet and instruction to access the V-Safe system.   [name] was instructed to call 911 with any severe reactions post vaccine: Mar[place] Kitchen Difficulty breathing  . Swelling of your face and throat  . A fast heartbeat  . A bad rash all over your body  . Dizziness and weakness    Immunizations Administered    Name Date Dose VIS Date Route   Pfizer COVID-19 Vaccine 02/06/2019 10:50 AM 0.3 mL 12/16/2018 Intramuscular   Manufacturer: [place]   Lot: [id number]   [place]: [id number]

## 2019-03-02 ENCOUNTER — Ambulatory Visit: Payer: Medicare HMO | Attending: Internal Medicine

## 2019-03-02 DIAGNOSIS — Z23 Encounter for immunization: Secondary | ICD-10-CM

## 2019-03-02 NOTE — Progress Notes (Signed)
   Covid-19 Vaccination Clinic  Name:  Victoria Neal    MRN: [id number] DOB: Jan 02, 1956  03/02/2019  [name] was observed post Covid-19 immunization for 15 minutes without incidence. She was provided with Vaccine Information Sheet and instruction to access the V-Safe system.   [name] was instructed to call 911 with any severe reactions post vaccine: Marland Kitchen Difficulty breathing  . Swelling of your face and throat  . A fast heartbeat  . A bad rash all over your body  . Dizziness and weakness    Immunizations Administered    Name Date Dose VIS Date Route   Pfizer COVID-19 Vaccine 03/02/2019 11:01 AM 0.3 mL 12/16/2018 Intramuscular   Manufacturer: [place]   Lot: [id number]   [place]: [id number]

## 2019-03-17 ENCOUNTER — Encounter: Payer: Self-pay | Admitting: Internal Medicine

## 2019-04-05 ENCOUNTER — Ambulatory Visit (AMBULATORY_SURGERY_CENTER): Payer: Self-pay | Admitting: *Deleted

## 2019-04-05 ENCOUNTER — Other Ambulatory Visit: Payer: Self-pay

## 2019-04-05 VITALS — Temp 97.1°F | Ht 63.0 in | Wt 128.0 lb

## 2019-04-05 DIAGNOSIS — Z1211 Encounter for screening for malignant neoplasm of colon: Secondary | ICD-10-CM

## 2019-04-05 NOTE — Progress Notes (Signed)
No egg or soy allergy known to patient  No issues with past sedation with any surgeries  or procedures, no intubation problems  No diet pills per patient No home 02 use per patient  No blood thinners per patient  Pt denies issues with constipation  No A fib or A flutter  EMMI video sent to pt's e mail   Completed covid vaccines 02-27-2019   Due to the COVID-19 pandemic we are asking patients to follow these guidelines. Please only bring one care partner. Please be aware that your care partner may wait in the car in the parking lot or if they feel like they will be too hot to wait in the car, they may wait in the lobby on the 4th floor. All care partners are required to wear a mask the entire time (we do not have any that we can provide them), they need to practice social distancing, and we will do a Covid check for all patient's and care partners when you arrive. Also we will check their temperature and your temperature. If the care partner waits in their car they need to stay in the parking lot the entire time and we will call them on their cell phone when the patient is ready for discharge so they can bring the car to the front of the building. Also all patient's will need to wear a mask into building.

## 2019-04-20 ENCOUNTER — Encounter: Payer: Self-pay | Admitting: Internal Medicine

## 2019-04-20 ENCOUNTER — Other Ambulatory Visit: Payer: Self-pay

## 2019-04-20 ENCOUNTER — Ambulatory Visit (AMBULATORY_SURGERY_CENTER): Payer: Medicare HMO | Admitting: Internal Medicine

## 2019-04-20 VITALS — BP 119/70 | HR 56 | Temp 97.5°F | Resp 17 | Ht 63.0 in | Wt 128.0 lb

## 2019-04-20 DIAGNOSIS — E119 Type 2 diabetes mellitus without complications: Secondary | ICD-10-CM | POA: Diagnosis not present

## 2019-04-20 DIAGNOSIS — R69 Illness, unspecified: Secondary | ICD-10-CM | POA: Diagnosis not present

## 2019-04-20 DIAGNOSIS — E785 Hyperlipidemia, unspecified: Secondary | ICD-10-CM | POA: Diagnosis not present

## 2019-04-20 DIAGNOSIS — D125 Benign neoplasm of sigmoid colon: Secondary | ICD-10-CM

## 2019-04-20 DIAGNOSIS — Z1211 Encounter for screening for malignant neoplasm of colon: Secondary | ICD-10-CM | POA: Diagnosis not present

## 2019-04-20 DIAGNOSIS — I10 Essential (primary) hypertension: Secondary | ICD-10-CM | POA: Diagnosis not present

## 2019-04-20 MED ORDER — SODIUM CHLORIDE 0.9 % IV SOLN: 500.0000 mL | Freq: Once | INTRAVENOUS | Status: DC

## 2019-04-20 NOTE — Op Note (Signed)
[facility] Patient Name: Victoria Neal Procedure Date: 04/20/2019 8:36 AM MRN: [id number] Endoscopist: Gatha Mayer , MD Age: 64 Referring MD:  Date of Birth: 07-15-1955 Gender: Female Account #: [id number] Procedure:                Colonoscopy Indications:              Screening for colorectal malignant neoplasm, Last                            colonoscopy: 2008 Medicines:                Propofol per Anesthesia, Monitored Anesthesia Care Procedure:                Pre-Anesthesia Assessment:                           - Prior to the procedure, a History and Physical                            was performed, and patient medications and                            allergies were reviewed. The patient's tolerance of                            previous anesthesia was also reviewed. The risks                            and benefits of the procedure and the sedation                            options and risks were discussed with the patient.                            All questions were answered, and informed consent                            was obtained. Prior Anticoagulants: The patient has                            taken no previous anticoagulant or antiplatelet                            agents. ASA Grade Assessment: II - A patient with                            mild systemic disease. After reviewing the risks                            and benefits, the patient was deemed in                            satisfactory condition to undergo the procedure.  After obtaining informed consent, the colonoscope                            was passed under direct vision. Throughout the                            procedure, the patient's blood pressure, pulse, and                            oxygen saturations were monitored continuously. The                            Colonoscope was introduced through the anus and                            advanced to the  the cecum, identified by                            appendiceal orifice and ileocecal valve. The                            colonoscopy was performed without difficulty. The                            patient tolerated the procedure well. The quality                            of the bowel preparation was good. The bowel                            preparation used was Miralax via split dose                            instruction. The ileocecal valve, appendiceal                            orifice, and rectum were photographed. Scope In: 8:50:15 AM Scope Out: 9:05:11 AM Scope Withdrawal Time: 0 hours 12 minutes 40 seconds  Total Procedure Duration: 0 hours 14 minutes 56 seconds  Findings:                 The perianal and digital rectal examinations were                            normal.                           A 3 mm polyp was found in the sigmoid colon. The                            polyp was sessile. The polyp was removed with a                            cold snare. Resection and retrieval were complete.  Verification of patient identification for the                            specimen was done. Estimated blood loss was minimal.                           The exam was otherwise without abnormality on                            direct and retroflexion views. Complications:            No immediate complications. Estimated Blood Loss:     Estimated blood loss was minimal. Impression:               - One 3 mm polyp in the sigmoid colon, removed with                            a cold snare. Resected and retrieved.                           - The examination was otherwise normal on direct                            and retroflexion views. Recommendation:           - Patient has a contact number available for                            emergencies. The signs and symptoms of potential                            delayed complications were discussed with the                             patient. Return to normal activities tomorrow.                            Written discharge instructions were provided to the                            patient.                           - Resume previous diet.                           - Continue present medications.                           - Repeat colonoscopy is recommended. The                            colonoscopy date will be determined after pathology                            results from today's exam become available for  review. Gatha Mayer, MD 04/20/2019 9:15:23 AM This report has been signed electronically.

## 2019-04-20 NOTE — Progress Notes (Signed)
Report to PACU, RN, vss, BBS= Clear.  

## 2019-04-20 NOTE — Patient Instructions (Addendum)
I found and removed one tiny polyp that is benign, I think.  I will let you know pathology results and when to have another routine colonoscopy by mail and/or My Chart.  I appreciate the opportunity to care for you. Gatha Mayer, MD, [facility]  Handout given for polyps.   YOU HAD AN ENDOSCOPIC PROCEDURE TODAY AT [facility]:   Refer to the procedure report that was given to you for any specific questions about what was found during the examination.  If the procedure report does not answer your questions, please call your gastroenterologist to clarify.  If you requested that your care partner not be given the details of your procedure findings, then the procedure report has been included in a sealed envelope for you to review at your convenience later.  YOU SHOULD EXPECT: Some feelings of bloating in the abdomen. Passage of more gas than usual.  Walking can help get rid of the air that was put into your GI tract during the procedure and reduce the bloating. If you had a lower endoscopy (such as a colonoscopy or flexible sigmoidoscopy) you may notice spotting of blood in your stool or on the toilet paper. If you underwent a bowel prep for your procedure, you may not have a normal bowel movement for a few days.  Please Note:  You might notice some irritation and congestion in your nose or some drainage.  This is from the oxygen used during your procedure.  There is no need for concern and it should clear up in a day or so.  SYMPTOMS TO REPORT IMMEDIATELY:   Following lower endoscopy (colonoscopy or flexible sigmoidoscopy):  Excessive amounts of blood in the stool  Significant tenderness or worsening of abdominal pains  Swelling of the abdomen that is new, acute  Fever of 100F or higher  For urgent or emergent issues, a gastroenterologist can be reached at any hour by calling [phone]. Do not use MyChart messaging for urgent concerns.    DIET:  We do recommend a small  meal at first, but then you may proceed to your regular diet.  Drink plenty of fluids but you should avoid alcoholic beverages for 24 hours.  ACTIVITY:  You should plan to take it easy for the rest of today and you should NOT DRIVE or use heavy machinery until tomorrow (because of the sedation medicines used during the test).    FOLLOW UP: Our staff will call the number listed on your records 48-72 hours following your procedure to check on you and address any questions or concerns that you may have regarding the information given to you following your procedure. If we do not reach you, we will leave a message.  We will attempt to reach you two times.  During this call, we will ask if you have developed any symptoms of COVID 19. If you develop any symptoms (ie: fever, flu-like symptoms, shortness of breath, cough etc.) before then, please call [phone].  If you test positive for Covid 19 in the 2 weeks post procedure, please call and report this information to [place].    If any biopsies were taken you will be contacted by phone or by letter within the next 1-3 weeks.  Please call us at [phone] if you have not heard about the biopsies in 3 weeks.    SIGNATURES/CONFIDENTIALITY: You and/or your care partner have signed paperwork which will be entered into your electronic medical record.  These signatures attest to  the fact that that the information above on your After Visit Summary has been reviewed and is understood.  Full responsibility of the confidentiality of this discharge information lies with you and/or your care-partner.

## 2019-04-20 NOTE — Progress Notes (Signed)
Called to room to assist during endoscopic procedure.  Patient ID and intended procedure confirmed with present staff. Received instructions for my participation in the procedure from the performing physician.  

## 2019-04-20 NOTE — Progress Notes (Signed)
Pt's states no medical or surgical changes since previsit or office visit.  JB - temp DT - vitals 

## 2019-04-24 ENCOUNTER — Telehealth: Payer: Self-pay | Admitting: *Deleted

## 2019-04-24 NOTE — Telephone Encounter (Signed)
Attempted 2nd f/u phone call. No answer. Left message.  °

## 2019-04-24 NOTE — Telephone Encounter (Signed)
  Follow up Call-  Call back number 04/20/2019  Post procedure Call Back phone  # [phone]  Permission to leave phone message Yes  Some recent data might be hidden     No answer at # given.  LM on VM.

## 2019-04-26 ENCOUNTER — Encounter: Payer: Self-pay | Admitting: Internal Medicine

## 2019-06-06 ENCOUNTER — Encounter: Payer: Self-pay | Admitting: Family Medicine

## 2019-06-06 ENCOUNTER — Ambulatory Visit (INDEPENDENT_AMBULATORY_CARE_PROVIDER_SITE_OTHER): Payer: Medicare HMO | Admitting: Family Medicine

## 2019-06-06 ENCOUNTER — Other Ambulatory Visit: Payer: Self-pay

## 2019-06-06 VITALS — BP 146/86 | HR 67 | Temp 97.3°F | Ht 63.0 in | Wt 121.0 lb

## 2019-06-06 DIAGNOSIS — H6120 Impacted cerumen, unspecified ear: Secondary | ICD-10-CM | POA: Insufficient documentation

## 2019-06-06 DIAGNOSIS — H6123 Impacted cerumen, bilateral: Secondary | ICD-10-CM | POA: Diagnosis not present

## 2019-06-06 DIAGNOSIS — N9089 Other specified noninflammatory disorders of vulva and perineum: Secondary | ICD-10-CM | POA: Diagnosis not present

## 2019-06-06 NOTE — Assessment & Plan Note (Signed)
Too dry to irrigate today (attempted w/o results) inst to get debrox (or similar product) otc and use 2-3 times weekly  Then f/u in approx 2 wk for re try with irrigation  If pain or new symptoms/ inst to call

## 2019-06-06 NOTE — Progress Notes (Signed)
Subjective:    Patient ID: Victoria Neal, female    DOB: [birth year], 64 y.o.   MRN: [id number]  This visit occurred during the SARS-CoV-2 public health emergency.  Safety protocols were in place, including screening questions prior to the visit, additional usage of staff PPE, and extensive cleaning of exam room while observing appropriate contact time as indicated for disinfecting solutions.    HPI  64 yo pt of Dr Silvio Pate presents with c/o bump on bottom and ear issue  Wt Readings from Last 3 Encounters:  06/06/19 121 lb (54.9 kg)  04/20/19 128 lb (58.1 kg)  04/05/19 128 lb (58.1 kg)   21.43 kg/m   Noticed a bump on Thursday-L side (in between anus and vaginal opening)  No painful  Has gone down in size No drainage   No h/o problems  No h/o herpes  No h/o hemorrhoids that she knows of   Wants her ears checked- hearing is dec a bit- ? Wax   Had nl pap 1/17  Due for this Gyn retired  Wants a female to do her next exam (pcp is female)   Patient Active Problem List   Diagnosis Date Noted  . Cerumen impaction 06/06/2019  . 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 GERD (gastroesophageal reflux disease)   . Back pain 12/10/2015  . Rib pain 12/10/2015  . Osteopenia 08/27/2015  . Type 2 diabetes mellitus with other circulatory complications ([place]) 23/55/7322  . Essential hypertension, benign 09/27/2012  . Hyperlipemia 09/02/2009   Past Medical History:  Diagnosis Date  . Allergic rhinitis   . Allergy   . Arthritis   . Cataract   . DM (diabetes mellitus) ([place])   . GERD (gastroesophageal reflux disease)   . Glaucoma    ? of, but seeing optho and told recently not glaucoma  . High cholesterol   . HTN (hypertension)   . Internal hemorrhoids   . Osteoporosis    in 2010, has refused bisphosphnate treatment or treament other then vit d and calcium  . Panic anxiety syndrome 01/03/2013  . Pure hyperglyceridemia    Past Surgical History:  Procedure Laterality Date  .  CESAREAN SECTION    . COLONOSCOPY  05/03/2006   internal hemorrhoids  . COLONOSCOPY    . OOPHORECTOMY Right 1999   Social History   Tobacco Use  . Smoking status: Never Smoker  . Smokeless tobacco: Never Used  Substance Use Topics  . Alcohol use: Yes    Comment: occ - very rare  . Drug use: No   Family History  Problem Relation Age of Onset  . Coronary artery disease Maternal Uncle   . Lung cancer Sister   . Cancer Sister        lung   . Pancreatic cancer Sister   . Breast cancer Maternal Aunt   . Colon cancer Mother 78       deceased age 62  . Hypertension Mother   . Diabetes Father   . Hypertension Father   . Diabetes Paternal Aunt   . Esophageal cancer Neg Hx   . Liver cancer Neg Hx   . Rectal cancer Neg Hx   . Stomach cancer Neg Hx   . Colon polyps Neg Hx    Allergies  Allergen Reactions  . Aspirin     REACTION: Swelling  . Lisinopril Cough  . Statins     Muscle cramps; pravastatin ok   Current Outpatient Medications on File Prior to Visit  Medication  Sig Dispense Refill  . amLODipine (NORVASC) 5 MG tablet Take 1 tablet (5 mg total) by mouth daily. 90 tablet 3  . Blood Glucose Monitoring Suppl ([place]) w/Device KIT Use as directed to check blood sugar once a day 1 kit 0  . FREESTYLE LITE test strip     . glucose blood (ONETOUCH VERIO) test strip Use as instructed 100 each 3  . Lancets (FREESTYLE) lancets     . Lancets (ONETOUCH ULTRASOFT) lancets Use as instructed 100 each 3  . losartan (COZAAR) 25 MG tablet Take 1 tablet (25 mg total) by mouth daily. 90 tablet 3  . metFORMIN (GLUCOPHAGE) 500 MG tablet Take 1 tablet (500 mg total) by mouth daily with breakfast. 90 tablet 3  . omeprazole (PRILOSEC) 20 MG capsule Take 1 capsule (20 mg total) by mouth daily. 90 capsule 3  . rosuvastatin (CRESTOR) 10 MG tablet Take 1 tablet (10 mg total) by mouth daily. 90 tablet 3  . Specialty Vitamins Products (ONE-A-DAY BONE STRENGTH PO) Take 1 tablet by  mouth daily.     Marland Kitchen XIIDRA 5 % SOLN      No current facility-administered medications on file prior to visit.    Review of Systems  Constitutional: Negative for activity change, appetite change, fatigue, fever and unexpected weight change.  HENT: Positive for hearing loss. Negative for congestion, ear discharge, ear pain, rhinorrhea, sinus pressure and sore throat.   Eyes: Negative for pain, redness and visual disturbance.  Respiratory: Negative for cough, shortness of breath and wheezing.   Cardiovascular: Negative for chest pain and palpitations.  Gastrointestinal: Negative for abdominal pain, blood in stool, constipation and diarrhea.  Endocrine: Negative for polydipsia and polyuria.  Genitourinary: Negative for dysuria, frequency and urgency.       Bump on L near labia-not tender  Musculoskeletal: Negative for arthralgias, back pain and myalgias.  Skin: Negative for pallor and rash.  Allergic/Immunologic: Negative for environmental allergies.  Neurological: Negative for dizziness, syncope and headaches.  Hematological: Negative for adenopathy. Does not bruise/bleed easily.  Psychiatric/Behavioral: Negative for decreased concentration and dysphoric mood. The patient is not nervous/anxious.        Objective:   Physical Exam Constitutional:      General: She is not in acute distress.    Appearance: Normal appearance. She is normal weight. She is not ill-appearing.  HENT:     Right Ear: There is impacted cerumen.     Left Ear: There is impacted cerumen.     Ears:     Comments: Bilateral dry cerumen impaction     Mouth/Throat:     Mouth: Mucous membranes are moist.  Eyes:     General: No scleral icterus.       Right eye: No discharge.        Left eye: No discharge.     Conjunctiva/sclera: Conjunctivae normal.     Pupils: Pupils are equal, round, and reactive to light.  Cardiovascular:     Rate and Rhythm: Normal rate and regular rhythm.  Pulmonary:     Effort: Pulmonary  effort is normal. No respiratory distress.     Breath sounds: Normal breath sounds. No wheezing.  Genitourinary:    General: Normal vulva.     Vagina: No vaginal discharge.     Comments: Small soft lump to the L of labia posteriorly   Non tender No redness/warmth or drainage   Musculoskeletal:     Cervical back: Normal range of motion.  Lymphadenopathy:  Cervical: No cervical adenopathy.  Skin:    General: Skin is warm and dry.     Coloration: Skin is not pale.     Findings: No erythema or rash.  Neurological:     Mental Status: She is alert.  Psychiatric:        Mood and Affect: Mood normal.           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

## 2019-06-06 NOTE — Patient Instructions (Addendum)
I think the lump you have is an uncomplicated cyst -nothing to do but watch it  Warm compresses may reduce size  If painful/bigger or draining let us know   Please stop up front to make your gyn visit appt. With me   The wax in your ears is too dry to remove with irrigation here  Please get Debrox (or similar product) over the counter and use as directed to loosen ear wax Avoid q tips  Follow up in about 2 weeks to try irrigation again

## 2019-06-06 NOTE — Assessment & Plan Note (Signed)
3-4 mm soft lump on L side of labia posteriorly, non tender  This per pt has become smaller on its own  inst to observe- warm compress prn  If no further imp let us know   Due for routine gyn exam-will schedule

## 2019-06-20 ENCOUNTER — Ambulatory Visit: Payer: Medicare HMO | Admitting: Internal Medicine

## 2019-07-20 ENCOUNTER — Encounter: Payer: BC Managed Care – PPO | Admitting: Internal Medicine

## 2019-08-23 ENCOUNTER — Other Ambulatory Visit: Payer: Self-pay | Admitting: Internal Medicine

## 2019-08-23 DIAGNOSIS — Z1231 Encounter for screening mammogram for malignant neoplasm of breast: Secondary | ICD-10-CM

## 2019-08-25 ENCOUNTER — Other Ambulatory Visit: Payer: Self-pay

## 2019-08-25 ENCOUNTER — Ambulatory Visit
Admission: RE | Admit: 2019-08-25 | Discharge: 2019-08-25 | Disposition: A | Discharge status: Home / Self Care | Payer: Medicare HMO | Source: Ambulatory Visit | Attending: Internal Medicine | Admitting: Internal Medicine

## 2019-08-25 DIAGNOSIS — Z1231 Encounter for screening mammogram for malignant neoplasm of breast: Secondary | ICD-10-CM

## 2019-08-29 ENCOUNTER — Ambulatory Visit: Payer: Medicare HMO | Admitting: Internal Medicine

## 2019-09-01 ENCOUNTER — Ambulatory Visit: Payer: Medicare HMO | Admitting: Family Medicine

## 2019-09-01 ENCOUNTER — Ambulatory Visit: Payer: Medicare HMO | Admitting: Internal Medicine

## 2019-09-06 ENCOUNTER — Ambulatory Visit: Payer: Medicare HMO | Admitting: Family Medicine

## 2019-10-06 DIAGNOSIS — R69 Illness, unspecified: Secondary | ICD-10-CM | POA: Diagnosis not present

## 2019-12-26 ENCOUNTER — Encounter: Payer: Medicare HMO | Admitting: Internal Medicine

## 2019-12-26 ENCOUNTER — Ambulatory Visit (INDEPENDENT_AMBULATORY_CARE_PROVIDER_SITE_OTHER): Payer: Medicare HMO | Admitting: Family Medicine

## 2019-12-26 ENCOUNTER — Other Ambulatory Visit (HOSPITAL_COMMUNITY)
Admission: RE | Admit: 2019-12-26 | Discharge: 2019-12-26 | Disposition: A | Discharge status: Home / Self Care | Payer: Medicare HMO | Source: Ambulatory Visit | Attending: Family Medicine | Admitting: Family Medicine

## 2019-12-26 ENCOUNTER — Encounter: Payer: Self-pay | Admitting: Family Medicine

## 2019-12-26 ENCOUNTER — Other Ambulatory Visit: Payer: Self-pay

## 2019-12-26 VITALS — BP 128/76 | HR 63 | Temp 96.9°F | Ht 63.0 in | Wt 123.1 lb

## 2019-12-26 DIAGNOSIS — Z01419 Encounter for gynecological examination (general) (routine) without abnormal findings: Secondary | ICD-10-CM | POA: Insufficient documentation

## 2019-12-26 DIAGNOSIS — M8580 Other specified disorders of bone density and structure, unspecified site: Secondary | ICD-10-CM | POA: Diagnosis not present

## 2019-12-26 DIAGNOSIS — Z1151 Encounter for screening for human papillomavirus (HPV): Secondary | ICD-10-CM | POA: Diagnosis not present

## 2019-12-26 DIAGNOSIS — R69 Illness, unspecified: Secondary | ICD-10-CM | POA: Diagnosis not present

## 2019-12-26 NOTE — Patient Instructions (Signed)
I think you are due for a bone density test   We will get your pap result to you   Make sure to get calcium and vitamin D for bone health  Try to get 1200-1500 mg of calcium per day with at least 1000 iu of vitamin D - for bone health   Take care of yourself

## 2019-12-26 NOTE — Progress Notes (Signed)
Subjective:    Patient ID: Victoria Neal, female    DOB: 10/04/1955, 64 y.o.   MRN: [id number]  This visit occurred during the SARS-CoV-2 public health emergency.  Safety protocols were in place, including screening questions prior to the visit, additional usage of staff PPE, and extensive cleaning of exam room while observing appropriate contact time as indicated for disinfecting solutions.    HPI 64 yo pt of Dr Silvio Pate presents for pap/gyn exam   Wt Readings from Last 3 Encounters:  12/26/19 123 lb 2 oz (55.8 kg)  06/06/19 121 lb (54.9 kg)  04/20/19 128 lb (58.1 kg)   21.81 kg/m   Last pap was 1/17 normal -no hx of abnormal pap No new partners  No need for STD screening  Has some vaginal dryness  Hist of R oophrectomy - when she had a lot of bleeding and a cyst   Never had hot flashes   Stopped having periods in late 50s    Mammogram 8/21 Self breast exam no lumps   Takes supplement for ca and D  Has osteopenia  No fractures   Patient Active Problem List   Diagnosis Date Noted  . Encounter for routine gynecological examination 12/26/2019  . Cerumen impaction 06/06/2019  . 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 GERD (gastroesophageal reflux disease)   . Back pain 12/10/2015  . Rib pain 12/10/2015  . Osteopenia 08/27/2015  . Type 2 diabetes mellitus with other circulatory complications ([place]) 23/53/6144  . Essential hypertension, benign 09/27/2012  . Hyperlipemia 09/02/2009   Past Medical History:  Diagnosis Date  . Allergic rhinitis   . Allergy   . Arthritis   . Cataract   . DM (diabetes mellitus) ([place])   . GERD (gastroesophageal reflux disease)   . Glaucoma    ? of, but seeing optho and told recently not glaucoma  . High cholesterol   . HTN (hypertension)   . Internal hemorrhoids   . Osteoporosis    in 2010, has refused bisphosphnate treatment or treament other then vit d and calcium  . Panic anxiety syndrome 01/03/2013  . Pure hyperglyceridemia     Past Surgical History:  Procedure Laterality Date  . CESAREAN SECTION    . COLONOSCOPY  05/03/2006   internal hemorrhoids  . COLONOSCOPY    . OOPHORECTOMY Right 1999   Social History   Tobacco Use  . Smoking status: Never Smoker  . Smokeless tobacco: Never Used  Vaping Use  . Vaping Use: Never used  Substance Use Topics  . Alcohol use: Yes    Comment: occ - very rare  . Drug use: No   Family History  Problem Relation Age of Onset  . Coronary artery disease Maternal Uncle   . Lung cancer Sister   . Cancer Sister        lung   . Pancreatic cancer Sister   . Breast cancer Maternal Aunt   . Colon cancer Mother 25       deceased age 83  . Hypertension Mother   . Diabetes Father   . Hypertension Father   . Diabetes Paternal Aunt   . Esophageal cancer Neg Hx   . Liver cancer Neg Hx   . Rectal cancer Neg Hx   . Stomach cancer Neg Hx   . Colon polyps Neg Hx    Allergies  Allergen Reactions  . Aspirin     REACTION: Swelling  . Lisinopril Cough  . Statins     Muscle  cramps; pravastatin ok   Current Outpatient Medications on File Prior to Visit  Medication Sig Dispense Refill  . amLODipine (NORVASC) 5 MG tablet Take 1 tablet (5 mg total) by mouth daily. 90 tablet 3  . Blood Glucose Monitoring Suppl ([place]) w/Device KIT Use as directed to check blood sugar once a day 1 kit 0  . FREESTYLE LITE test strip     . glucose blood (ONETOUCH VERIO) test strip Use as instructed 100 each 3  . Lancets (FREESTYLE) lancets     . Lancets (ONETOUCH ULTRASOFT) lancets Use as instructed 100 each 3  . losartan (COZAAR) 25 MG tablet Take 1 tablet (25 mg total) by mouth daily. 90 tablet 3  . metFORMIN (GLUCOPHAGE) 500 MG tablet Take 1 tablet (500 mg total) by mouth daily with breakfast. 90 tablet 3  . omeprazole (PRILOSEC) 20 MG capsule Take 1 capsule (20 mg total) by mouth daily. 90 capsule 3  . rosuvastatin (CRESTOR) 10 MG tablet Take 1 tablet (10 mg total) by mouth  daily. 90 tablet 3  . Specialty Vitamins Products (ONE-A-DAY BONE STRENGTH PO) Take 1 tablet by mouth daily.     Marland Kitchen XIIDRA 5 % SOLN      No current facility-administered medications on file prior to visit.    Review of Systems  Constitutional: Negative for activity change, appetite change, fatigue, fever and unexpected weight change.  HENT: Negative for congestion, ear pain, rhinorrhea, sinus pressure and sore throat.   Eyes: Negative for pain, redness and visual disturbance.  Respiratory: Negative for cough, shortness of breath and wheezing.   Cardiovascular: Negative for chest pain and palpitations.  Gastrointestinal: Negative for abdominal pain, blood in stool, constipation and diarrhea.  Endocrine: Negative for polydipsia and polyuria.  Genitourinary: Negative for dysuria, frequency, urgency, vaginal bleeding, vaginal discharge and vaginal pain.       Some vaginal dryness-not bothersome  Musculoskeletal: Negative for arthralgias, back pain and myalgias.  Skin: Negative for pallor and rash.  Allergic/Immunologic: Negative for environmental allergies.  Neurological: Negative for dizziness, syncope and headaches.  Hematological: Negative for adenopathy. Does not bruise/bleed easily.  Psychiatric/Behavioral: Negative for decreased concentration and dysphoric mood. The patient is not nervous/anxious.        Objective:   Physical Exam Constitutional:      Appearance: Normal appearance. She is normal weight. She is not ill-appearing.  Eyes:     Conjunctiva/sclera: Conjunctivae normal.     Pupils: Pupils are equal, round, and reactive to light.  Genitourinary:    Comments: Breast exam: No mass, nodules, thickening, tenderness, bulging, retraction, inflamation, nipple discharge or skin changes noted.  No axillary or clavicular LA.              Anus appears normal w/o hemorrhoids or masses     External genitalia : nl appearance and hair distribution/no lesions     Urethral  meatus : nl size, no lesions or prolapse     Urethra: no masses, tenderness or scarring    Bladder : no masses or tenderness     Vagina: nl general appearance, no discharge or  Lesions, no significant cystocele  or rectocele     Cervix: no lesions/ discharge or friability    Uterus: nl size, contour, position, and mobility (not fixed) , non tender    Adnexa : no masses, tenderness, enlargement or nodularity      no significant atrophy /dryness noted today Musculoskeletal:     Comments: Petite frame No  kyphosis   Neurological:     Mental Status: She is alert.  Psychiatric:        Mood and Affect: Mood normal.           Assessment & Plan:   Problem List Items Addressed This Visit      Musculoskeletal and Integument   Osteopenia    Last dexa 2017  Pt will disc ordering one with pcp  Taking a supplement with ca and D No fractures          Other   Encounter for routine gynecological examination - Primary    Routine gyn with pap at pt's request  Last was 2017 -normal  No hx of abn pap or HPV  No new partners  Some vaginal dryness-does not bother her  Exam and pap done today         Relevant Orders   Cytology - PAP([facility])

## 2019-12-26 NOTE — Assessment & Plan Note (Signed)
Last dexa 2017  Pt will disc ordering one with pcp  Taking a supplement with ca and D No fractures

## 2019-12-26 NOTE — Assessment & Plan Note (Signed)
Routine gyn with pap at pt's request  Last was 2017 -normal  No hx of abn pap or HPV  No new partners  Some vaginal dryness-does not bother her  Exam and pap done today

## 2020-01-03 LAB — CYTOLOGY - PAP: Diagnosis: NEGATIVE

## 2020-01-04 DIAGNOSIS — E119 Type 2 diabetes mellitus without complications: Secondary | ICD-10-CM | POA: Diagnosis not present

## 2020-01-04 DIAGNOSIS — H25813 Combined forms of age-related cataract, bilateral: Secondary | ICD-10-CM | POA: Diagnosis not present

## 2020-01-04 DIAGNOSIS — H04123 Dry eye syndrome of bilateral lacrimal glands: Secondary | ICD-10-CM | POA: Diagnosis not present

## 2020-01-04 LAB — HM DIABETES EYE EXAM

## 2020-01-10 DIAGNOSIS — Z20822 Contact with and (suspected) exposure to covid-19: Secondary | ICD-10-CM | POA: Diagnosis not present

## 2020-01-19 ENCOUNTER — Encounter: Payer: Self-pay | Admitting: Internal Medicine

## 2020-01-19 ENCOUNTER — Other Ambulatory Visit: Payer: Self-pay

## 2020-01-19 ENCOUNTER — Ambulatory Visit (INDEPENDENT_AMBULATORY_CARE_PROVIDER_SITE_OTHER): Payer: Medicare HMO | Admitting: Internal Medicine

## 2020-01-19 VITALS — BP 116/76 | HR 60 | Temp 97.1°F | Ht 63.0 in | Wt 122.0 lb

## 2020-01-19 DIAGNOSIS — Z7189 Other specified counseling: Secondary | ICD-10-CM | POA: Diagnosis not present

## 2020-01-19 DIAGNOSIS — E1159 Type 2 diabetes mellitus with other circulatory complications: Secondary | ICD-10-CM

## 2020-01-19 DIAGNOSIS — I10 Essential (primary) hypertension: Secondary | ICD-10-CM | POA: Diagnosis not present

## 2020-01-19 DIAGNOSIS — M8580 Other specified disorders of bone density and structure, unspecified site: Secondary | ICD-10-CM | POA: Diagnosis not present

## 2020-01-19 DIAGNOSIS — H9190 Unspecified hearing loss, unspecified ear: Secondary | ICD-10-CM

## 2020-01-19 DIAGNOSIS — K219 Gastro-esophageal reflux disease without esophagitis: Secondary | ICD-10-CM | POA: Diagnosis not present

## 2020-01-19 DIAGNOSIS — E2839 Other primary ovarian failure: Secondary | ICD-10-CM | POA: Diagnosis not present

## 2020-01-19 DIAGNOSIS — Z23 Encounter for immunization: Secondary | ICD-10-CM

## 2020-01-19 DIAGNOSIS — Z0000 Encounter for general adult medical examination without abnormal findings: Secondary | ICD-10-CM

## 2020-01-19 LAB — COMPREHENSIVE METABOLIC PANEL
ALT: 12 U/L (ref 0–35)
AST: 17 U/L (ref 0–37)
Albumin: 4.7 g/dL (ref 3.5–5.2)
Alkaline Phosphatase: 65 U/L (ref 39–117)
BUN: 21 mg/dL (ref 6–23)
CO2: 27 mEq/L (ref 19–32)
Calcium: 9.6 mg/dL (ref 8.4–10.5)
Chloride: 104 mEq/L (ref 96–112)
Creatinine, Ser: 0.73 mg/dL (ref 0.40–1.20)
GFR: 85.88 mL/min (ref >60.00)
Glucose, Bld: 122 mg/dL — ABNORMAL HIGH (ref 70–99)
Potassium: 4.1 mEq/L (ref 3.5–5.1)
Sodium: 138 mEq/L (ref 135–145)
Total Bilirubin: 0.6 mg/dL (ref 0.2–1.2)
Total Protein: 7.6 g/dL (ref 6.0–8.3)

## 2020-01-19 LAB — LIPID PANEL
Cholesterol: 164 mg/dL (ref 0–200)
HDL: 91.8 mg/dL (ref >39.00)
LDL Cholesterol: 54 mg/dL (ref 0–99)
NonHDL: 72.65
Total CHOL/HDL Ratio: 2
Triglycerides: 94 mg/dL (ref 0.0–149.0)
VLDL: 18.8 mg/dL (ref 0.0–40.0)

## 2020-01-19 LAB — HEMOGLOBIN A1C: Hgb A1c MFr Bld: 6.7 % — ABNORMAL HIGH (ref 4.6–6.5)

## 2020-01-19 LAB — HM DIABETES FOOT EXAM

## 2020-01-19 LAB — VITAMIN D 25 HYDROXY (VIT D DEFICIENCY, FRACTURES): VITD: 32.55 ng/mL (ref 30.00–100.00)

## 2020-01-19 LAB — CBC
HCT: 40.6 % (ref 36.0–46.0)
Hemoglobin: 13.5 g/dL (ref 12.0–15.0)
MCHC: 33.2 g/dL (ref 30.0–36.0)
MCV: 87.3 fl (ref 78.0–100.0)
Platelets: 236 10*3/uL (ref 150.0–400.0)
RBC: 4.66 Mil/uL (ref 3.87–5.11)
RDW: 13.6 % (ref 11.5–15.5)
WBC: 4.7 10*3/uL (ref 4.0–10.5)

## 2020-01-19 NOTE — Progress Notes (Signed)
Subjective:    Patient ID: Victoria Neal, female    DOB: 28-May-1955, 65 y.o.   MRN: [id number]  HPI Here for initial Medicare wellness visit and follow up of chronic health conditions This visit occurred during the SARS-CoV-2 public health emergency.  Safety protocols were in place, including screening questions prior to the visit, additional usage of staff PPE, and extensive cleaning of exam room while observing appropriate contact time as indicated for disinfecting solutions.   Reviewed advanced directives Reviewed other doctors--Dr Bassiri---ophthal, son is her dentist, Dr Delrae Rend Vision is fading on right--will need cataract done Mild hearing issues--wants to get this checked out Does regular exercise--resistance training No hospitalizations or surgery this year Fell once---related to knees. No injury No depression or anhedonia No memory problems Independent with instrumental ADLs  Concerned about cramping  She does spend all day standing at [facility] Also with stiffness in fingers--no major swelling (hard to open at times). Massaging machine does help No medications  Trouble with wax in her ears Wonders about ear doctor Some issues with hearing at times  Checks sugars rarely Usually 120's No low sugar reactions ---feels she gets headache if sugar high No foot numbness, tingling or pain  No chest pain No palpitations No dizziness or syncope No edema  No heartburn lately Eating healthier Rare symptoms if eats nuts--but no medication needed Teaspoon of lime or ginger tea do help  Current Outpatient Medications on File Prior to Visit  Medication Sig Dispense Refill  . amLODipine (NORVASC) 5 MG tablet Take 1 tablet (5 mg total) by mouth daily. 90 tablet 3  . Blood Glucose Monitoring Suppl (ONETOUCH VERIO FLEX SYSTEM) w/Device KIT Use as directed to check blood sugar once a day 1 kit 0  . losartan (COZAAR) 25 MG tablet Take 1 tablet (25 mg total) by mouth  daily. 90 tablet 3  . metFORMIN (GLUCOPHAGE) 500 MG tablet Take 1 tablet (500 mg total) by mouth daily with breakfast. 90 tablet 3  . rosuvastatin (CRESTOR) 10 MG tablet Take 1 tablet (10 mg total) by mouth daily. 90 tablet 3  . Specialty Vitamins Products (ONE-A-DAY BONE STRENGTH PO) Take 1 tablet by mouth daily.     Marland Kitchen XIIDRA 5 % SOLN     . FREESTYLE LITE test strip     . glucose blood (ONETOUCH VERIO) test strip Use as instructed 100 each 3  . Lancets (FREESTYLE) lancets     . Lancets (ONETOUCH ULTRASOFT) lancets Use as instructed 100 each 3   No current facility-administered medications on file prior to visit.    Allergies  Allergen Reactions  . Aspirin     REACTION: Swelling  . Lisinopril Cough  . Statins     Muscle cramps; pravastatin ok    Past Medical History:  Diagnosis Date  . Allergic rhinitis   . Allergy   . Arthritis   . Cataract   . DM (diabetes mellitus) ([place])   . GERD (gastroesophageal reflux disease)   . Glaucoma    ? of, but seeing optho and told recently not glaucoma  . High cholesterol   . HTN (hypertension)   . Internal hemorrhoids   . Osteoporosis    in 2010, has refused bisphosphnate treatment or treament other then vit d and calcium  . Panic anxiety syndrome 01/03/2013  . Pure hyperglyceridemia     Past Surgical History:  Procedure Laterality Date  . CESAREAN SECTION    . COLONOSCOPY  05/03/2006   internal hemorrhoids  .  COLONOSCOPY    . OOPHORECTOMY Right 1999    Family History  Problem Relation Age of Onset  . Coronary artery disease Maternal Uncle   . Lung cancer Sister   . Cancer Sister        lung   . Pancreatic cancer Sister   . Breast cancer Maternal Aunt   . Colon cancer Mother 41       deceased age 9  . Hypertension Mother   . Diabetes Father   . Hypertension Father   . Diabetes Paternal Aunt   . Esophageal cancer Neg Hx   . Liver cancer Neg Hx   . Rectal cancer Neg Hx   . Stomach cancer Neg Hx   . Colon polyps Neg Hx      Social History   Socioeconomic History  . Marital status: Married    Spouse name: Not on file  . Number of children: 4  . Years of education: Not on file  . Highest education level: Not on file  Occupational History  . Occupation: Pharmacist, hospital-- elementary    Comment: Retired  Tobacco Use  . Smoking status: Never Smoker  . Smokeless tobacco: Never Used  Vaping Use  . Vaping Use: Never used  Substance and Sexual Activity  . Alcohol use: Yes    Comment: occ - very rare  . Drug use: No  . Sexual activity: Not on file  Other Topics Concern  . Not on file  Social History Narrative   Work or School: 2nd grade at [employer] Situation: lives with husband and son - has 4 children, son in Facilities manager school, son animation, daughter is Pension scheme manager      Spiritual Beliefs: Baha'i faith - all religions are one, prayer based, no health care restrictions      No living will   Husband, then children, as health care POA   Would accept resuscitation   Would accept tube feeding at least short term--no sure about longer      Social Determinants of Health   Financial Resource Strain: Not on file  Food Insecurity: Not on file  Transportation Needs: Not on file  Physical Activity: Not on file  Stress: Not on file  Social Connections: Not on file  Intimate Partner Violence: Not on file   Review of Systems Appetite is good Weight is stable Sleeps well Wears seat belt Teeth are fine--has partial on the top Bowels are fine--no blood Voids fine--no incontinence No suspicious skin lesions No other joint or back problems    Objective:   Physical Exam Constitutional:      Appearance: Normal appearance.  HENT:     Mouth/Throat:     Comments: No lesions Eyes:     Conjunctiva/sclera: Conjunctivae normal.     Pupils: Pupils are equal, round, and reactive to light.  Cardiovascular:     Rate and Rhythm: Normal rate and regular rhythm.     Pulses: Normal pulses.     Heart sounds:  No murmur heard. No gallop.   Pulmonary:     Effort: Pulmonary effort is normal.     Breath sounds: Normal breath sounds. No wheezing or rales.  Abdominal:     Palpations: Abdomen is soft.     Tenderness: There is no abdominal tenderness.  Musculoskeletal:     Cervical back: Neck supple.     Right lower leg: No edema.     Left lower leg: No edema.  Lymphadenopathy:     Cervical:  No cervical adenopathy.  Skin:    General: Skin is warm.     Findings: No rash.     Comments: No foot lesions  Neurological:     Mental Status: She is alert and oriented to person, place, and time.     Comments: President---"Biden, Trump, Obama" [id number] Recall 3/3  Normal sensation in feet  Psychiatric:        Mood and Affect: Mood normal.        Behavior: Behavior normal.            Assessment & Plan:

## 2020-01-19 NOTE — Addendum Note (Signed)
Addended by: Pilar Grammes on: 01/19/2020 10:13 AM   Modules accepted: Orders

## 2020-01-19 NOTE — Patient Instructions (Signed)
You can call the [facility] to set up your bone density test

## 2020-01-19 NOTE — Progress Notes (Signed)
Hearing Screening   Method: Audiometry   125Hz  250Hz  500Hz  1000Hz  2000Hz  3000Hz  4000Hz  6000Hz  8000Hz   Right ear:   20 20 20  20     Left ear:   20 20 20  20     Vision Screening Comments: December 2021

## 2020-01-19 NOTE — Assessment & Plan Note (Signed)
I have personally reviewed the Medicare Annual Wellness questionnaire and have noted 1. The patient's medical and social history 2. Their use of alcohol, tobacco or illicit drugs 3. Their current medications and supplements 4. The patient's functional ability including ADL's, fall risks, home safety risks and hearing or visual             impairment. 5. Diet and physical activities 6. Evidence for depression or mood disorders  The patients weight, height, BMI and visual acuity have been recorded in the chart I have made referrals, counseling and provided education to the patient based review of the above and I have provided the pt with a written personalized care plan for preventive services.  I have provided you with a copy of your personalized plan for preventive services. Please take the time to review along with your updated medication list.  Yearly mammograms--due August Colon due again 2028 Had last pap Pneumovax today Had COVID booster and flu vaccine She will get shingrix at the pharmacy Exercises regularly

## 2020-01-19 NOTE — Assessment & Plan Note (Signed)
Will recheck DEXA Consider bisphosphonate if big decline

## 2020-01-19 NOTE — Assessment & Plan Note (Signed)
See social history 

## 2020-01-19 NOTE — Assessment & Plan Note (Signed)
Hopefully good control on metformin daily Also with HTN Is on statin

## 2020-01-19 NOTE — Assessment & Plan Note (Signed)
Uses the omeprazole rarely

## 2020-01-19 NOTE — Assessment & Plan Note (Signed)
BP Readings from Last 3 Encounters:  01/19/20 116/76  12/26/19 128/76  06/06/19 (!) 146/86   Good on amlodipine and losartan

## 2020-02-14 ENCOUNTER — Ambulatory Visit (INDEPENDENT_AMBULATORY_CARE_PROVIDER_SITE_OTHER): Payer: Medicare HMO | Admitting: Otolaryngology

## 2020-03-13 ENCOUNTER — Other Ambulatory Visit: Payer: Self-pay | Admitting: Internal Medicine

## 2020-03-13 DIAGNOSIS — I152 Hypertension secondary to endocrine disorders: Secondary | ICD-10-CM

## 2020-03-19 ENCOUNTER — Other Ambulatory Visit: Payer: Self-pay | Admitting: Internal Medicine

## 2020-03-19 DIAGNOSIS — E119 Type 2 diabetes mellitus without complications: Secondary | ICD-10-CM

## 2020-03-20 ENCOUNTER — Other Ambulatory Visit: Payer: Self-pay | Admitting: Internal Medicine

## 2020-05-13 ENCOUNTER — Ambulatory Visit
Admission: RE | Admit: 2020-05-13 | Discharge: 2020-05-13 | Disposition: A | Discharge status: Home / Self Care | Payer: Medicare HMO | Source: Ambulatory Visit | Attending: Internal Medicine | Admitting: Internal Medicine

## 2020-05-13 ENCOUNTER — Other Ambulatory Visit: Payer: Self-pay

## 2020-05-13 DIAGNOSIS — E2839 Other primary ovarian failure: Secondary | ICD-10-CM

## 2020-05-13 DIAGNOSIS — M8580 Other specified disorders of bone density and structure, unspecified site: Secondary | ICD-10-CM

## 2020-05-13 DIAGNOSIS — Z780 Asymptomatic menopausal state: Secondary | ICD-10-CM | POA: Diagnosis not present

## 2020-05-13 DIAGNOSIS — M8589 Other specified disorders of bone density and structure, multiple sites: Secondary | ICD-10-CM | POA: Diagnosis not present

## 2020-07-19 ENCOUNTER — Ambulatory Visit: Payer: Medicare HMO | Admitting: Internal Medicine

## 2020-07-23 ENCOUNTER — Ambulatory Visit: Payer: Medicare HMO | Admitting: Internal Medicine

## 2020-07-24 ENCOUNTER — Ambulatory Visit: Payer: Medicare HMO | Admitting: Internal Medicine

## 2020-08-13 ENCOUNTER — Ambulatory Visit (INDEPENDENT_AMBULATORY_CARE_PROVIDER_SITE_OTHER): Payer: Medicare HMO | Admitting: Internal Medicine

## 2020-08-13 ENCOUNTER — Other Ambulatory Visit: Payer: Self-pay

## 2020-08-13 ENCOUNTER — Encounter: Payer: Self-pay | Admitting: Internal Medicine

## 2020-08-13 VITALS — BP 122/80 | HR 65 | Temp 97.4°F | Ht 63.0 in | Wt 129.0 lb

## 2020-08-13 DIAGNOSIS — I10 Essential (primary) hypertension: Secondary | ICD-10-CM

## 2020-08-13 DIAGNOSIS — I152 Hypertension secondary to endocrine disorders: Secondary | ICD-10-CM

## 2020-08-13 DIAGNOSIS — E1159 Type 2 diabetes mellitus with other circulatory complications: Secondary | ICD-10-CM | POA: Diagnosis not present

## 2020-08-13 DIAGNOSIS — M159 Polyosteoarthritis, unspecified: Secondary | ICD-10-CM | POA: Insufficient documentation

## 2020-08-13 LAB — POCT GLYCOSYLATED HEMOGLOBIN (HGB A1C): Hemoglobin A1C: 6.6 % — AB (ref 4.0–5.6)

## 2020-08-13 MED ORDER — LOSARTAN POTASSIUM 25 MG PO TABS: 25.0000 mg | ORAL_TABLET | Freq: Every day | ORAL | 3 refills | Status: DC

## 2020-08-13 MED ORDER — AMLODIPINE BESYLATE 5 MG PO TABS: 5.0000 mg | ORAL_TABLET | Freq: Every day | ORAL | 3 refills | Status: DC

## 2020-08-13 MED ORDER — ROSUVASTATIN CALCIUM 10 MG PO TABS: 10.0000 mg | ORAL_TABLET | Freq: Every day | ORAL | 3 refills | Status: DC

## 2020-08-13 NOTE — Assessment & Plan Note (Signed)
BP Readings from Last 3 Encounters:  08/13/20 122/80  01/19/20 116/76  12/26/19 128/76   Good control on losartan and amlodipine

## 2020-08-13 NOTE — Assessment & Plan Note (Signed)
Some stiffness--hands and legs Discussed trying tylenol prn

## 2020-08-13 NOTE — Progress Notes (Addendum)
Subjective:    Patient ID: Victoria Neal, female    DOB: 1955/06/13, 65 y.o.   MRN: [id number]  HPI Here for follow up of diabetes This visit occurred during the SARS-CoV-2 public health emergency.  Safety protocols were in place, including screening questions prior to the visit, additional usage of staff PPE, and extensive cleaning of exam room while observing appropriate contact time as indicated for disinfecting solutions.   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, radio) Hasn't tried any meds  No chest pain or SOB No dizziness or syncope No edema  Current Outpatient Medications on File Prior to Visit  Medication Sig Dispense Refill   amLODipine (NORVASC) 5 MG tablet TAKE 1 TABLET BY MOUTH EVERY DAY 90 tablet 3   Blood Glucose Monitoring Suppl (ONETOUCH VERIO FLEX SYSTEM) w/Device KIT Use as directed to check blood sugar once a day 1 kit 0   glucose blood (ONETOUCH VERIO) test strip Use as instructed 100 each 3   Lancets (ONETOUCH ULTRASOFT) lancets Use as instructed 100 each 3   losartan (COZAAR) 25 MG tablet TAKE 1 TABLET BY MOUTH EVERY DAY 90 tablet 3   metFORMIN (GLUCOPHAGE) 500 MG tablet TAKE 1 TABLET BY MOUTH EVERY DAY WITH BREAKFAST 90 tablet 3   rosuvastatin (CRESTOR) 10 MG tablet TAKE 1 TABLET BY MOUTH EVERY DAY 90 tablet 3   Specialty Vitamins Products (ONE-A-DAY BONE STRENGTH PO) Take 1 tablet by mouth daily.      XIIDRA 5 % SOLN      No current facility-administered medications on file prior to visit.    Allergies  Allergen Reactions   Aspirin     REACTION: Swelling   Lisinopril Cough   Statins     Muscle cramps; pravastatin ok    Past Medical History:  Diagnosis Date   Allergic rhinitis    Allergy    Arthritis    Cataract    DM (diabetes mellitus) ([facility])    GERD (gastroesophageal reflux disease)     Glaucoma    ? of, but seeing optho and told recently not glaucoma   High cholesterol    HTN (hypertension)    Internal hemorrhoids    Osteoporosis    in 2010, has refused bisphosphnate treatment or treament other then vit d and calcium   Panic anxiety syndrome 01/03/2013   Pure hyperglyceridemia     Past Surgical History:  Procedure Laterality Date   CESAREAN SECTION     COLONOSCOPY  05/03/2006   internal hemorrhoids   COLONOSCOPY     OOPHORECTOMY Right 1999    Family History  Problem Relation Age of Onset   Coronary artery disease Maternal Uncle    Lung cancer Sister    Cancer Sister        lung    Pancreatic cancer Sister    Breast cancer Maternal Aunt    Colon cancer Mother 10       deceased age 60   Hypertension Mother    Diabetes Father    Hypertension Father    Diabetes Paternal Aunt    Esophageal cancer Neg Hx    Liver cancer Neg Hx    Rectal cancer Neg Hx    Stomach cancer Neg Hx    Colon polyps Neg Hx     Social History   Socioeconomic History   Marital status:  Married    Spouse name: Not on file   Number of children: 4   Years of education: Not on file   Highest education level: Not on file  Occupational History   Occupation: Pharmacist, hospital-- elementary    Comment: Retired  Tobacco Use   Smoking status: Never   Smokeless tobacco: Never  Vaping Use   Vaping Use: Never used  Substance and Sexual Activity   Alcohol use: Yes    Comment: occ - very rare   Drug use: No   Sexual activity: Not on file  Other Topics Concern   Not on file  Social History Narrative   Work or School: 2nd grade at [employer] Situation: lives with husband and son - has 4 children, son in Facilities manager school, son animation, daughter is Pension scheme manager      Spiritual Beliefs: Baha'i faith - all religions are one, prayer based, no health care restrictions      No living will   Husband, then children, as health care POA   Would accept resuscitation   Would accept tube feeding at  least short term--no sure about longer      Social Determinants of Health   Financial Resource Strain: Not on file  Food Insecurity: Not on file  Transportation Needs: Not on file  Physical Activity: Not on file  Stress: Not on file  Social Connections: Not on file  Intimate Partner Violence: Not on file   Review of Systems Does exercise routine regularly Appetite is good Weight stable Sleeps well Slight urge urinary incontinence at night--uses pad     Objective:   Physical Exam Constitutional:      Appearance: Normal appearance.  Cardiovascular:     Rate and Rhythm: Normal rate and regular rhythm.     Pulses: Normal pulses.     Heart sounds: No murmur heard.   No gallop.  Pulmonary:     Effort: Pulmonary effort is normal.     Breath sounds: Normal breath sounds. No wheezing or rales.  Musculoskeletal:     Cervical back: Neck supple.     Right lower leg: No edema.     Left lower leg: No edema.  Lymphadenopathy:     Cervical: No cervical adenopathy.  Neurological:     Mental Status: She is alert.  Psychiatric:        Mood and Affect: Mood normal.        Behavior: Behavior normal.           Assessment & Plan:

## 2020-08-13 NOTE — Assessment & Plan Note (Signed)
A1c 6.6% on just metformin No changes needed Rx for HTN

## 2020-09-05 ENCOUNTER — Other Ambulatory Visit: Payer: Self-pay | Admitting: Internal Medicine

## 2020-09-05 DIAGNOSIS — Z1231 Encounter for screening mammogram for malignant neoplasm of breast: Secondary | ICD-10-CM

## 2020-10-15 ENCOUNTER — Ambulatory Visit: Payer: BC Managed Care – PPO

## 2020-11-14 ENCOUNTER — Ambulatory Visit
Admission: RE | Admit: 2020-11-14 | Discharge: 2020-11-14 | Disposition: A | Discharge status: Home / Self Care | Payer: Medicare HMO | Source: Ambulatory Visit | Attending: Internal Medicine | Admitting: Internal Medicine

## 2020-11-14 ENCOUNTER — Other Ambulatory Visit: Payer: Self-pay

## 2020-11-14 DIAGNOSIS — Z1231 Encounter for screening mammogram for malignant neoplasm of breast: Secondary | ICD-10-CM | POA: Diagnosis not present

## 2020-12-09 ENCOUNTER — Telehealth: Payer: Self-pay | Admitting: Family Medicine

## 2020-12-09 DIAGNOSIS — U071 COVID-19: Secondary | ICD-10-CM

## 2020-12-09 MED ORDER — MOLNUPIRAVIR EUA 200MG CAPSULE: 4.0000 | ORAL_CAPSULE | Freq: Two times a day (BID) | ORAL | 0 refills | Status: AC

## 2020-12-09 NOTE — Telephone Encounter (Signed)
The patient's husband asked for me to urgently assess his wife on the telephone.     Started on Friday, started coughing and sneezing.  Fever to go up and it will flucuate from 99-101.  Feels really weak.  Still has some cough.  PO intake is normal.  Very diarrhea.    Roughly, the patient does not appear to be in acute distress.  I speak to her face-to-face via video visit.  I answered all their questions including complications from [id number] as well as concerns and questions for potential side effects regarding antiviral medication.  This is without a formal office visit.  I did the best that I could with this case, following the patient's request, but in my assessment the patient is in no way shape or form is significantly ill    ICD-10-CM   1. COVID-19  U07.1       Meds ordered this encounter  Medications   molnupiravir EUA (LAGEVRIO) 200 mg CAPS capsule    Sig: Take 4 capsules (800 mg total) by mouth 2 (two) times daily for 5 days.    Dispense:  40 capsule    Refill:  0

## 2020-12-31 DIAGNOSIS — H25813 Combined forms of age-related cataract, bilateral: Secondary | ICD-10-CM | POA: Diagnosis not present

## 2020-12-31 DIAGNOSIS — H04123 Dry eye syndrome of bilateral lacrimal glands: Secondary | ICD-10-CM | POA: Diagnosis not present

## 2020-12-31 DIAGNOSIS — E113291 Type 2 diabetes mellitus with mild nonproliferative diabetic retinopathy without macular edema, right eye: Secondary | ICD-10-CM | POA: Diagnosis not present

## 2020-12-31 LAB — HM DIABETES EYE EXAM

## 2021-01-21 ENCOUNTER — Encounter: Payer: Medicare HMO | Admitting: Internal Medicine

## 2021-01-28 DIAGNOSIS — L821 Other seborrheic keratosis: Secondary | ICD-10-CM | POA: Diagnosis not present

## 2021-01-28 DIAGNOSIS — D225 Melanocytic nevi of trunk: Secondary | ICD-10-CM | POA: Diagnosis not present

## 2021-01-28 DIAGNOSIS — L708 Other acne: Secondary | ICD-10-CM | POA: Diagnosis not present

## 2021-02-25 ENCOUNTER — Other Ambulatory Visit: Payer: Self-pay

## 2021-02-25 ENCOUNTER — Encounter: Payer: Self-pay | Admitting: Internal Medicine

## 2021-02-25 ENCOUNTER — Ambulatory Visit (INDEPENDENT_AMBULATORY_CARE_PROVIDER_SITE_OTHER): Payer: Medicare HMO | Admitting: Internal Medicine

## 2021-02-25 VITALS — BP 138/84 | HR 61 | Temp 97.0°F | Ht 63.0 in | Wt 126.0 lb

## 2021-02-25 DIAGNOSIS — E1159 Type 2 diabetes mellitus with other circulatory complications: Secondary | ICD-10-CM

## 2021-02-25 DIAGNOSIS — M159 Polyosteoarthritis, unspecified: Secondary | ICD-10-CM | POA: Diagnosis not present

## 2021-02-25 DIAGNOSIS — K219 Gastro-esophageal reflux disease without esophagitis: Secondary | ICD-10-CM

## 2021-02-25 DIAGNOSIS — E785 Hyperlipidemia, unspecified: Secondary | ICD-10-CM | POA: Diagnosis not present

## 2021-02-25 DIAGNOSIS — Z0000 Encounter for general adult medical examination without abnormal findings: Secondary | ICD-10-CM

## 2021-02-25 DIAGNOSIS — I10 Essential (primary) hypertension: Secondary | ICD-10-CM | POA: Diagnosis not present

## 2021-02-25 DIAGNOSIS — E119 Type 2 diabetes mellitus without complications: Secondary | ICD-10-CM

## 2021-02-25 LAB — RENAL FUNCTION PANEL
Albumin: 4.9 g/dL (ref 3.5–5.2)
BUN: 18 mg/dL (ref 6–23)
CO2: 33 mEq/L — ABNORMAL HIGH (ref 19–32)
Calcium: 10.1 mg/dL (ref 8.4–10.5)
Chloride: 103 mEq/L (ref 96–112)
Creatinine, Ser: 0.78 mg/dL (ref 0.40–1.20)
GFR: 78.71 mL/min (ref >60.00)
Glucose, Bld: 122 mg/dL — ABNORMAL HIGH (ref 70–99)
Phosphorus: 4.4 mg/dL (ref 2.3–4.6)
Potassium: 4.4 mEq/L (ref 3.5–5.1)
Sodium: 141 mEq/L (ref 135–145)

## 2021-02-25 LAB — CBC
HCT: 42.4 % (ref 36.0–46.0)
Hemoglobin: 14 g/dL (ref 12.0–15.0)
MCHC: 33 g/dL (ref 30.0–36.0)
MCV: 87.8 fl (ref 78.0–100.0)
Platelets: 253 10*3/uL (ref 150.0–400.0)
RBC: 4.83 Mil/uL (ref 3.87–5.11)
RDW: 14.1 % (ref 11.5–15.5)
WBC: 4.3 10*3/uL (ref 4.0–10.5)

## 2021-02-25 LAB — HEPATIC FUNCTION PANEL
ALT: 15 U/L (ref 0–35)
AST: 17 U/L (ref 0–37)
Albumin: 4.9 g/dL (ref 3.5–5.2)
Alkaline Phosphatase: 73 U/L (ref 39–117)
Bilirubin, Direct: 0.1 mg/dL (ref 0.0–0.3)
Total Bilirubin: 0.6 mg/dL (ref 0.2–1.2)
Total Protein: 7.7 g/dL (ref 6.0–8.3)

## 2021-02-25 LAB — LIPID PANEL
Cholesterol: 204 mg/dL — ABNORMAL HIGH (ref 0–200)
HDL: 84.4 mg/dL (ref >39.00)
LDL Cholesterol: 80 mg/dL (ref 0–99)
NonHDL: 119.92
Total CHOL/HDL Ratio: 2
Triglycerides: 200 mg/dL — ABNORMAL HIGH (ref 0.0–149.0)
VLDL: 40 mg/dL (ref 0.0–40.0)

## 2021-02-25 LAB — MICROALBUMIN / CREATININE URINE RATIO
Creatinine,U: 110.8 mg/dL
Microalb Creat Ratio: 0.6 mg/g (ref 0.0–30.0)
Microalb, Ur: <0.7 mg/dL (ref 0.0–1.9)

## 2021-02-25 LAB — HEMOGLOBIN A1C: Hgb A1c MFr Bld: 6.9 % — ABNORMAL HIGH (ref 4.6–6.5)

## 2021-02-25 LAB — HM DIABETES FOOT EXAM

## 2021-02-25 MED ORDER — LOSARTAN POTASSIUM 25 MG PO TABS: 25.0000 mg | ORAL_TABLET | Freq: Every day | ORAL | 3 refills | Status: DC

## 2021-02-25 MED ORDER — AMLODIPINE BESYLATE 5 MG PO TABS: 5.0000 mg | ORAL_TABLET | Freq: Every day | ORAL | 3 refills | Status: DC

## 2021-02-25 MED ORDER — ROSUVASTATIN CALCIUM 10 MG PO TABS: 10.0000 mg | ORAL_TABLET | Freq: Every day | ORAL | 3 refills | Status: DC

## 2021-02-25 MED ORDER — METFORMIN HCL 500 MG PO TABS: ORAL_TABLET | ORAL | 3 refills | Status: DC

## 2021-02-25 NOTE — Assessment & Plan Note (Signed)
Lab Results  Component Value Date   HGBA1C 6.6 (A) 08/13/2020   Still seems to have reasonable control Will check A1c and other labs HTN as well On metformin 500 daily

## 2021-02-25 NOTE — Progress Notes (Signed)
Hearing Screening - Comments:: Passed Whisper Test Vision Screening - Comments:: December 2022

## 2021-02-25 NOTE — Patient Instructions (Signed)
I would recommend an appointment with [facility]--you should be able to set this up.

## 2021-02-25 NOTE — Assessment & Plan Note (Signed)
Mild symptoms Not using meds

## 2021-02-25 NOTE — Assessment & Plan Note (Signed)
No problems with rosuvastatin 10mg  daily

## 2021-02-25 NOTE — Assessment & Plan Note (Signed)
BP Readings from Last 3 Encounters:  02/25/21 138/84  08/13/20 122/80  01/19/20 116/76   Controlled with amlodipine 5mg  daily and losartan 25

## 2021-02-25 NOTE — Progress Notes (Signed)
Subjective:    Patient ID: Victoria Neal, female    DOB: 1955-11-10, 66 y.o.   MRN: [id number]  HPI Here for Medicare wellness visit and follow up of chronic health conditions Reviewed advanced directives Reviewed other doctors---son is her dentist, Dr Delrae Rend, Dr Vassie Moselle, ?dermatologist No hospitalizations or surgery in the past year Vision declined some--may need cataract soon No tobacco Very rare alcohol Exercises regularly---stretching, cardio and walking No falls No depression or anhedonia Independent with instrumental ADLs No memory issues  Has some clicking in ears Mild hearing issues Ready to see ENT  Checks sugars once a week or so Usually 120's to 140 No low sugar reactions No foot numbness, tingling or burning  Occasional pain along right flank and shoulder Trouble sleeping on that side  No chest pain No palpitations Some cramps when on feet all day at work No dizziness or syncope No edema No SOB  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 90 tablet 3   Blood Glucose Monitoring Suppl (ONETOUCH VERIO FLEX SYSTEM) w/Device KIT Use as directed to check blood sugar once a day 1 kit 0   glucose blood (ONETOUCH VERIO) test strip Use as instructed 100 each 3   Lancets (ONETOUCH ULTRASOFT) lancets Use as instructed 100 each 3   losartan (COZAAR) 25 MG tablet Take 1 tablet (25 mg total) by mouth daily. 90 tablet 3   metFORMIN (GLUCOPHAGE) 500 MG tablet TAKE 1 TABLET BY MOUTH EVERY DAY WITH BREAKFAST 90 tablet 3   rosuvastatin (CRESTOR) 10 MG tablet Take 1 tablet (10 mg total) by mouth daily. 90 tablet 3   Specialty Vitamins Products (ONE-A-DAY BONE STRENGTH PO) Take 1 tablet by mouth daily.      No current facility-administered medications on file prior to visit.     Allergies  Allergen Reactions   Aspirin     REACTION: Swelling   Lisinopril Cough   Statins     Muscle cramps; pravastatin ok    Past Medical History:  Diagnosis Date   Allergic rhinitis    Allergy    Arthritis    Cataract    DM (diabetes mellitus) (HCC)    GERD (gastroesophageal reflux disease)    Glaucoma    ? of, but seeing optho and told recently not glaucoma   High cholesterol    HTN (hypertension)    Internal hemorrhoids    Osteoporosis    in 2010, has refused bisphosphnate treatment or treament other then vit d and calcium   Panic anxiety syndrome 01/03/2013   Pure hyperglyceridemia     Past Surgical History:  Procedure Laterality Date   CESAREAN SECTION     COLONOSCOPY  05/03/2006   internal hemorrhoids   COLONOSCOPY     OOPHORECTOMY Right 1999    Family History  Problem Relation Age of Onset   Coronary artery disease Maternal Uncle    Lung cancer Sister    Cancer Sister        lung    Pancreatic cancer Sister    Breast cancer Maternal Aunt    Colon cancer Mother 86       deceased age 25   Hypertension Mother    Diabetes Father    Hypertension Father    Diabetes Paternal Aunt    Esophageal cancer  Neg Hx    Liver cancer Neg Hx    Rectal cancer Neg Hx    Stomach cancer Neg Hx    Colon polyps Neg Hx     Social History   Socioeconomic History   Marital status: Married    Spouse name: Not on file   Number of children: 4   Years of education: Not on file   Highest education level: Not on file  Occupational History   Occupation: Pharmacist, hospital-- elementary    Comment: Retired  Tobacco Use   Smoking status: Never    Passive exposure: Past   Smokeless tobacco: Never  Vaping Use   Vaping Use: Never used  Substance and Sexual Activity   Alcohol use: Yes    Comment: occ - very rare   Drug use: No   Sexual activity: Not on file  Other Topics Concern   Not on file  Social History Narrative   Work or School: 2nd grade at [employer] Situation: lives with husband and son - has 4 children, son in Facilities manager school, son animation, daughter is Pension scheme manager      Spiritual Beliefs: Baha'i faith - all religions are one, prayer based, no health care restrictions      No living will   Husband, then children, as health care POA   Would accept resuscitation   Would accept tube feeding at least short term--no sure about longer      Social Determinants of Health   Financial Resource Strain: Not on file  Food Insecurity: Not on file  Transportation Needs: Not on file  Physical Activity: Not on file  Stress: Not on file  Social Connections: Not on file  Intimate Partner Violence: Not on file   Review of Systems Appetite is good Weight is stable Sleeps well Wears seat belt Teeth are fine---partial denture. Occasional heartburn---uses lemon/ginger. No dysphagia Bowels move fine---no blood No dysuria. Some nocturia. No incontinence No skin lesions    Objective:   Physical Exam Constitutional:      Appearance: Normal appearance.  HENT:     Ears:     Comments: Marked cerumen on right, mild on left    Mouth/Throat:     Pharynx: No oropharyngeal exudate or posterior oropharyngeal erythema.  Eyes:     Conjunctiva/sclera: Conjunctivae normal.     Pupils: Pupils are equal, round, and reactive to light.  Cardiovascular:     Rate and Rhythm: Normal rate and regular rhythm.     Pulses: Normal pulses.     Heart sounds: No murmur heard.   No gallop.  Pulmonary:     Effort: Pulmonary effort is normal.     Breath sounds: Normal breath sounds. No wheezing or rales.  Abdominal:     Palpations: Abdomen is soft.     Tenderness: There is no abdominal tenderness.  Musculoskeletal:     Cervical back: Neck supple.     Right lower leg: No edema.     Left lower leg: No edema.  Lymphadenopathy:     Cervical: No cervical adenopathy.  Skin:    Findings: No lesion or rash.     Comments: No foot lesions  Neurological:     General: No  focal deficit present.     Mental Status: She is alert and oriented to person, place, and time.     Comments: Mini-Cog okay---clock good, memory 2/3  Psychiatric:        Mood and Affect: Mood normal.  Behavior: Behavior normal.           Assessment & Plan:

## 2021-02-25 NOTE — Assessment & Plan Note (Signed)
I have personally reviewed the Medicare Annual Wellness questionnaire and have noted 1. The patient's medical and social history 2. Their use of alcohol, tobacco or illicit drugs 3. Their current medications and supplements 4. The patient's functional ability including ADL's, fall risks, home safety risks and hearing or visual             impairment. 5. Diet and physical activities 6. Evidence for depression or mood disorders  The patients weight, height, BMI and visual acuity have been recorded in the chart I have made referrals, counseling and provided education to the patient based review of the above and I have provided the pt with a written personalized care plan for preventive services.  I have provided you with a copy of your personalized plan for preventive services. Please take the time to review along with your updated medication list.  Colon due again 2028 Recent mammogram--continue every 1-2 years at least till 89 Done with paps Had bivalent COVID Flu vaccine in the fall shingrix at pharmacy Exercises regularly

## 2021-02-25 NOTE — Assessment & Plan Note (Signed)
Mild No regular meds Mostly strain from work in [employer]

## 2021-03-14 ENCOUNTER — Telehealth: Payer: Self-pay | Admitting: Orthopaedic Surgery

## 2021-03-14 NOTE — Telephone Encounter (Signed)
Pt called and would like to see Dr.Bokshan instead of Xu. Is this okay?  ? ?CB [phone] ?

## 2021-03-17 ENCOUNTER — Ambulatory Visit (INDEPENDENT_AMBULATORY_CARE_PROVIDER_SITE_OTHER): Payer: Medicare HMO | Admitting: Orthopaedic Surgery

## 2021-03-17 ENCOUNTER — Other Ambulatory Visit (HOSPITAL_BASED_OUTPATIENT_CLINIC_OR_DEPARTMENT_OTHER): Payer: Self-pay | Admitting: Orthopaedic Surgery

## 2021-03-17 ENCOUNTER — Telehealth: Payer: Self-pay | Admitting: Orthopaedic Surgery

## 2021-03-17 ENCOUNTER — Ambulatory Visit (HOSPITAL_BASED_OUTPATIENT_CLINIC_OR_DEPARTMENT_OTHER)
Admission: RE | Admit: 2021-03-17 | Discharge: 2021-03-17 | Disposition: A | Discharge status: Home / Self Care | Payer: Medicare HMO | Source: Ambulatory Visit | Attending: Orthopaedic Surgery | Admitting: Orthopaedic Surgery

## 2021-03-17 ENCOUNTER — Other Ambulatory Visit: Payer: Self-pay

## 2021-03-17 ENCOUNTER — Other Ambulatory Visit (HOSPITAL_BASED_OUTPATIENT_CLINIC_OR_DEPARTMENT_OTHER): Payer: Self-pay

## 2021-03-17 DIAGNOSIS — S53442A Ulnar collateral ligament sprain of left elbow, initial encounter: Secondary | ICD-10-CM | POA: Diagnosis not present

## 2021-03-17 DIAGNOSIS — M25522 Pain in left elbow: Secondary | ICD-10-CM

## 2021-03-17 MED ORDER — ZOSTER VAC RECOMB ADJUVANTED 50 MCG/0.5ML IM SUSR
INTRAMUSCULAR | 0 refills | Status: DC
  Filled 2021-03-17: qty 0.5, 1d supply, fill #0

## 2021-03-17 MED ORDER — DICLOFENAC SODIUM 1 % EX GEL: 4.0000 g | Freq: Four times a day (QID) | CUTANEOUS | Status: DC

## 2021-03-17 MED ORDER — DICLOFENAC SODIUM 1 % EX GEL: CUTANEOUS | 0 refills | Status: DC

## 2021-03-17 MED ORDER — DICLOFENAC SODIUM 1 % EX GEL
CUTANEOUS | 0 refills | Status: DC
  Filled 2021-03-17: qty 100, 10d supply, fill #0

## 2021-03-17 NOTE — Telephone Encounter (Signed)
Pt's husband [name] called stating the pharmacy don't see prescription. Please resend script. They pt and husband at pharmacy. Please call pt [phone].  ?

## 2021-03-17 NOTE — Telephone Encounter (Signed)
Sure.  I don't think I've ever seen her before anyways.

## 2021-03-17 NOTE — Telephone Encounter (Signed)
Looks like Appt already made for today. ?

## 2021-03-17 NOTE — Progress Notes (Signed)
? ?                            ? ? ?Chief Complaint: Left elbow. ?  ? ? ?History of Present Illness:  ? ? ?Victoria Neal is a 66 y.o. female presents with 6 days of left elbow pain after she was jerked by her new goldendoodle puppy and ultimately landed on the left elbow.  She states that this time she has had bruising about the medial aspect of the elbow.  She is here today for further assessment and evaluation.  She has been using a compression wrap as well as icing the elbow.  She has not taken any medications. ? ? ? ?Surgical History:   ?None ? ?PMH/PSH/Family History/Social History/Meds/Allergies:   ? ?Past Medical History:  ?Diagnosis Date  ? Allergic rhinitis   ? Allergy   ? Arthritis   ? Cataract   ? DM (diabetes mellitus) ([place])   ? GERD (gastroesophageal reflux disease)   ? Glaucoma   ? ? of, but seeing optho and told recently not glaucoma  ? High cholesterol   ? HTN (hypertension)   ? Internal hemorrhoids   ? Osteoporosis   ? in 2010, has refused bisphosphnate treatment or treament other then vit d and calcium  ? Panic anxiety syndrome 01/03/2013  ? Pure hyperglyceridemia   ? ?Past Surgical History:  ?Procedure Laterality Date  ? CESAREAN SECTION    ? COLONOSCOPY  05/03/2006  ? internal hemorrhoids  ? COLONOSCOPY    ? OOPHORECTOMY Right 1999  ? ?Social History  ? ?Socioeconomic History  ? Marital status: Married  ?  Spouse name: Not on file  ? Number of children: 4  ? Years of education: Not on file  ? Highest education level: Not on file  ?Occupational History  ? Occupation: Pharmacist, hospital-- elementary  ?  Comment: Retired  ?Tobacco Use  ? Smoking status: Never  ?  Passive exposure: Past  ? Smokeless tobacco: Never  ?Vaping Use  ? Vaping Use: Never used  ?Substance and Sexual Activity  ? Alcohol use: Yes  ?  Comment: occ - very rare  ? Drug use: No  ? Sexual activity: Not on file  ?Other Topics Concern  ? Not on file  ?Social History Narrative  ? Work or School: 2nd grade at [employer]  ? Home  Situation: lives with husband and son - has 4 children, son in dental school, son animation, daughter is actor  ?   ? Spiritual Beliefs: Baha'i faith - all religions are one, prayer based, no health care restrictions  ?   ? No living will  ? Husband, then children, as health care POA  ? Would accept resuscitation  ? Would accept tube feeding at least short term--no sure about longer  ?   ? ?Social Determinants of Health  ? ?Financial Resource Strain: Not on file  ?Food Insecurity: Not on file  ?Transportation Needs: Not on file  ?Physical Activity: Not on file  ?Stress: Not on file  ?Social Connections: Not on file  ? ?Family History  ?Problem Relation Age of Onset  ? Coronary artery disease Maternal Uncle   ? Lung cancer Sister   ? Cancer Sister   ?     lung   ? Pancreatic cancer Sister   ? Breast cancer Maternal Aunt   ? Colon cancer Mother 70  ?     deceased age 41  ?  Hypertension Mother   ? Diabetes Father   ? Hypertension Father   ? Diabetes Paternal Aunt   ? Esophageal cancer Neg Hx   ? Liver cancer Neg Hx   ? Rectal cancer Neg Hx   ? Stomach cancer Neg Hx   ? Colon polyps Neg Hx   ? ?Allergies  ?Allergen Reactions  ? Aspirin   ?  REACTION: Swelling  ? Lisinopril Cough  ? Statins   ?  Muscle cramps; pravastatin ok  ? ?Current Outpatient Medications  ?Medication Sig Dispense Refill  ? amLODipine (NORVASC) 5 MG tablet Take 1 tablet (5 mg total) by mouth daily. 90 tablet 3  ? Blood Glucose Monitoring Suppl (ONETOUCH VERIO FLEX SYSTEM) w/Device KIT Use as directed to check blood sugar once a day 1 kit 0  ? glucose blood (ONETOUCH VERIO) test strip Use as instructed 100 each 3  ? Lancets (ONETOUCH ULTRASOFT) lancets Use as instructed 100 each 3  ? losartan (COZAAR) 25 MG tablet Take 1 tablet (25 mg total) by mouth daily. 90 tablet 3  ? metFORMIN (GLUCOPHAGE) 500 MG tablet TAKE 1 TABLET BY MOUTH EVERY DAY WITH BREAKFAST 90 tablet 3  ? rosuvastatin (CRESTOR) 10 MG tablet Take 1 tablet (10 mg total) by mouth daily. 90  tablet 3  ? Specialty Vitamins Products (ONE-A-DAY BONE STRENGTH PO) Take 1 tablet by mouth daily.     ? Zoster Vaccine Adjuvanted [facility]) injection Inject into the muscle. 0.5 mL 0  ? ?Current Facility-Administered Medications  ?Medication Dose Route Frequency Provider Last Rate Last Admin  ? diclofenac Sodium (VOLTAREN) 1 % topical gel 4 g  4 g Topical QID Vanetta Mulders, MD      ? ?No results found. ? ?Review of Systems:   ?A ROS was performed including pertinent positives and negatives as documented in the HPI. ? ?Physical Exam :   ?Constitutional: NAD and appears stated age ?Neurological: Alert and oriented ?Psych: Appropriate affect and cooperative ?There were no vitals taken for this visit.  ? ?Comprehensive Musculoskeletal Exam:   ? ?Left elbow tenderness palpation over the medial epicondyle as well as medial olecranon and sublime tubercle.  There is bruising around this area.  She has minimal pain with resisted flexion of the left wrist.  2+ radial pulse.  Sensation is intact in all distributions ? ?Imaging:   ?Xray (2 views left elbow): ?Normal ? ? ?I personally reviewed and interpreted the radiographs. ? ? ?Assessment:   ?Six 14-year-old female with left medial elbow pain consistent with a UCL sprain.  I did discuss that given the fact that they do not appear to be what sounds like a dislocation incident this is likely to heal completely within 6 weeks.  I would like her to work on range of motion as tolerated of the left elbow.  She will begin strengthening after that.  We will plan to order physical therapy to work on the left elbow.  At this time I would like her to avoid any type of heavy lifting type exercises.  I will plan to prescribe her some over-the-counter Voltaren gel for topical application and she is hesitant to take any type of oral anti-inflammatories ? ?Plan :   ? ?-Return to clinic in 6 weeks ? ? ? ? ?I personally saw and evaluated the patient, and participated in the management and  treatment plan. ? ?Vanetta Mulders, MD ?Attending Physician, Orthopedic Surgery ? ?This document was dictated using Systems analyst. A reasonable attempt  at proof reading has been made to minimize errors. ?

## 2021-03-20 ENCOUNTER — Ambulatory Visit: Payer: Medicare HMO | Admitting: Orthopaedic Surgery

## 2021-03-28 ENCOUNTER — Encounter: Payer: Medicare HMO | Admitting: Internal Medicine

## 2021-04-03 ENCOUNTER — Ambulatory Visit (INDEPENDENT_AMBULATORY_CARE_PROVIDER_SITE_OTHER): Payer: Medicare HMO | Admitting: Orthopaedic Surgery

## 2021-04-03 ENCOUNTER — Ambulatory Visit (HOSPITAL_BASED_OUTPATIENT_CLINIC_OR_DEPARTMENT_OTHER): Payer: Medicare HMO | Admitting: Orthopaedic Surgery

## 2021-04-03 DIAGNOSIS — S53442A Ulnar collateral ligament sprain of left elbow, initial encounter: Secondary | ICD-10-CM

## 2021-04-03 MED ORDER — LIDOCAINE HCL 1 % IJ SOLN
4.0000 mL | INTRAMUSCULAR | Status: AC | PRN
  Administered 2021-04-03: 4 mL

## 2021-04-03 MED ORDER — TRIAMCINOLONE ACETONIDE 40 MG/ML IJ SUSP
80.0000 mg | INTRAMUSCULAR | Status: AC | PRN
  Administered 2021-04-03: 80 mg via INTRA_ARTICULAR

## 2021-04-03 NOTE — Progress Notes (Signed)
? ?                            ? ? ?Chief Complaint: Left elbow. ?  ? ? ?History of Present Illness:  ? ?04/03/2021: Presents today for follow-up of the left elbow.  She is states that the pain is about the same.  She has been having limited range of motion about the left elbow.  She states that she can use up her basic activities of the she feels like there is somewhat of a block to motion ? ? ?Victoria Neal is a 66 y.o. female presents with 6 days of left elbow pain after she was jerked by her new goldendoodle puppy and ultimately landed on the left elbow.  She states that this time she has had bruising about the medial aspect of the elbow.  She is here today for further assessment and evaluation.  She has been using a compression wrap as well as icing the elbow.  She has not taken any medications. ? ? ? ?Surgical History:   ?None ? ?PMH/PSH/Family History/Social History/Meds/Allergies:   ? ?Past Medical History:  ?Diagnosis Date  ?? Allergic rhinitis   ?? Allergy   ?? Arthritis   ?? Cataract   ?? DM (diabetes mellitus) ([place])   ?? GERD (gastroesophageal reflux disease)   ?? Glaucoma   ? ? of, but seeing optho and told recently not glaucoma  ?? High cholesterol   ?? HTN (hypertension)   ?? Internal hemorrhoids   ?? Osteoporosis   ? in 2010, has refused bisphosphnate treatment or treament other then vit d and calcium  ?? Panic anxiety syndrome 01/03/2013  ?? Pure hyperglyceridemia   ? ?Past Surgical History:  ?Procedure Laterality Date  ?? CESAREAN SECTION    ?? COLONOSCOPY  05/03/2006  ? internal hemorrhoids  ?? COLONOSCOPY    ?? OOPHORECTOMY Right 1999  ? ?Social History  ? ?Socioeconomic History  ?? Marital status: Married  ?  Spouse name: Not on file  ?? Number of children: 4  ?? Years of education: Not on file  ?? Highest education level: Not on file  ?Occupational History  ?? Occupation: Pharmacist, hospital-- elementary  ?  Comment: Retired  ?Tobacco Use  ?? Smoking status: Never  ?  Passive exposure: Past  ??  Smokeless tobacco: Never  ?Vaping Use  ?? Vaping Use: Never used  ?Substance and Sexual Activity  ?? Alcohol use: Yes  ?  Comment: occ - very rare  ?? Drug use: No  ?? Sexual activity: Not on file  ?Other Topics Concern  ?? Not on file  ?Social History Narrative  ? Work or School: 2nd grade at [employer]  ? Home Situation: lives with husband and son - has 4 children, son in dental school, son animation, daughter is actor  ?   ? Spiritual Beliefs: Baha'i faith - all religions are one, prayer based, no health care restrictions  ?   ? No living will  ? Husband, then children, as health care POA  ? Would accept resuscitation  ? Would accept tube feeding at least short term--no sure about longer  ?   ? ?Social Determinants of Health  ? ?Financial Resource Strain: Not on file  ?Food Insecurity: Not on file  ?Transportation Needs: Not on file  ?Physical Activity: Not on file  ?Stress: Not on file  ?Social Connections: Not on file  ? ?Family History  ?Problem Relation  Age of Onset  ?? Coronary artery disease Maternal Uncle   ?? Lung cancer Sister   ?? Cancer Sister   ?     lung   ?? Pancreatic cancer Sister   ?? Breast cancer Maternal Aunt   ?? Colon cancer Mother 84  ?     deceased age 63  ?? Hypertension Mother   ?? Diabetes Father   ?? Hypertension Father   ?? Diabetes Paternal Aunt   ?? Esophageal cancer Neg Hx   ?? Liver cancer Neg Hx   ?? Rectal cancer Neg Hx   ?? Stomach cancer Neg Hx   ?? Colon polyps Neg Hx   ? ?Allergies  ?Allergen Reactions  ?? Aspirin   ?  REACTION: Swelling  ?? Lisinopril Cough  ?? Statins   ?  Muscle cramps; pravastatin ok  ? ?Current Outpatient Medications  ?Medication Sig Dispense Refill  ?? amLODipine (NORVASC) 5 MG tablet Take 1 tablet (5 mg total) by mouth daily. 90 tablet 3  ?? Blood Glucose Monitoring Suppl (ONETOUCH VERIO FLEX SYSTEM) w/Device KIT Use as directed to check blood sugar once a day 1 kit 0  ?? diclofenac Sodium (VOLTAREN) 1 % GEL Apply to affected area(s) four times  daily. 100 g 0  ?? glucose blood (ONETOUCH VERIO) test strip Use as instructed 100 each 3  ?? Lancets (ONETOUCH ULTRASOFT) lancets Use as instructed 100 each 3  ?? losartan (COZAAR) 25 MG tablet Take 1 tablet (25 mg total) by mouth daily. 90 tablet 3  ?? metFORMIN (GLUCOPHAGE) 500 MG tablet TAKE 1 TABLET BY MOUTH EVERY DAY WITH BREAKFAST 90 tablet 3  ?? rosuvastatin (CRESTOR) 10 MG tablet Take 1 tablet (10 mg total) by mouth daily. 90 tablet 3  ?? Specialty Vitamins Products (ONE-A-DAY BONE STRENGTH PO) Take 1 tablet by mouth daily.     ?? Zoster Vaccine Adjuvanted [facility]) injection Inject into the muscle. 0.5 mL 0  ? ?Current Facility-Administered Medications  ?Medication Dose Route Frequency Provider Last Rate Last Admin  ?? diclofenac Sodium (VOLTAREN) 1 % topical gel 4 g  4 g Topical QID Vanetta Mulders, MD      ? ?No results found. ? ?Review of Systems:   ?A ROS was performed including pertinent positives and negatives as documented in the HPI. ? ?Physical Exam :   ?Constitutional: NAD and appears stated age ?Neurological: Alert and oriented ?Psych: Appropriate affect and cooperative ?There were no vitals taken for this visit.  ? ?Comprehensive Musculoskeletal Exam:   ? ?Left elbow tenderness palpation over the medial epicondyle as well as medial olecranon and sublime tubercle.  Range of motion is from 0 to 90 degrees she has minimal pain with resisted flexion of the left wrist.  2+ radial pulse.  Sensation is intact in all distributions ? ?Imaging:   ?Xray (2 views left elbow): ?Normal ? ? ?I personally reviewed and interpreted the radiographs. ? ? ?Assessment:   ?66 year old female with left medial elbow pain consistent with a UCL sprain.  At today's visit her motion was quite limited.  As result I have offered her an ultrasound-guided injection of the left elbow in order to hopefully prevent stiffness.  I would like her to get into physical therapy as soon as possible so we can continue to work on her  range of motion.  Do believe that the majority of her pain is likely coming from stiffness in the left elbow.  That being said I would like to obtain a CT  scan of the left elbow to rule out any type of occult fracture given her persistent pain and inability to flex beyond 90 degrees ?Plan :   ? ?-Plan for CT scan left elbow to rule out occult fracture ?-Plan for physical therapy for range of motion of the left elbow ?-Ultrasound-guided injection performed today after verbal consent obtained ? ? ?Procedure Note ? ?Patient: Victoria Neal             ?Date of Birth: October 08, 1955           ?MRN: [id number]             ?Visit Date: 04/03/2021 ? ?Procedures: ?Visit Diagnoses:  ?1. Sprain of ulnar collateral ligament of left elbow, initial encounter   ? ? ?Medium Joint Inj: L elbow on 04/03/2021 5:26 PM ?Indications: pain ?Details: 22 G 1.5 in needle, ultrasound-guided lateral approach ?Medications: 4 mL lidocaine 1 %; 80 mg triamcinolone acetonide 40 MG/ML ?Outcome: tolerated well, no immediate complications ?Consent was given by the patient. Immediately prior to procedure a time out was called to verify the correct patient, procedure, equipment, support staff and site/side marked as required. Patient was prepped and draped in the usual sterile fashion.  ? ? ? ? ? ? ? ? ?I personally saw and evaluated the patient, and participated in the management and treatment plan. ? ?Vanetta Mulders, MD ?Attending Physician, Orthopedic Surgery ? ?This document was dictated using Systems analyst. A reasonable attempt at proof reading has been made to minimize errors. ?

## 2021-04-04 ENCOUNTER — Ambulatory Visit (HOSPITAL_BASED_OUTPATIENT_CLINIC_OR_DEPARTMENT_OTHER): Payer: Medicare HMO | Admitting: Orthopaedic Surgery

## 2021-04-07 ENCOUNTER — Encounter (HOSPITAL_BASED_OUTPATIENT_CLINIC_OR_DEPARTMENT_OTHER): Payer: Self-pay | Admitting: Orthopaedic Surgery

## 2021-04-08 ENCOUNTER — Ambulatory Visit (HOSPITAL_BASED_OUTPATIENT_CLINIC_OR_DEPARTMENT_OTHER)
Admission: RE | Admit: 2021-04-08 | Discharge: 2021-04-08 | Disposition: A | Discharge status: Home / Self Care | Payer: Medicare HMO | Source: Ambulatory Visit | Attending: Orthopaedic Surgery | Admitting: Orthopaedic Surgery

## 2021-04-08 DIAGNOSIS — M7989 Other specified soft tissue disorders: Secondary | ICD-10-CM | POA: Diagnosis not present

## 2021-04-08 DIAGNOSIS — S53442A Ulnar collateral ligament sprain of left elbow, initial encounter: Secondary | ICD-10-CM | POA: Diagnosis not present

## 2021-04-08 DIAGNOSIS — M25522 Pain in left elbow: Secondary | ICD-10-CM | POA: Diagnosis not present

## 2021-04-10 DIAGNOSIS — H9313 Tinnitus, bilateral: Secondary | ICD-10-CM | POA: Diagnosis not present

## 2021-04-10 DIAGNOSIS — J310 Chronic rhinitis: Secondary | ICD-10-CM | POA: Diagnosis not present

## 2021-04-10 DIAGNOSIS — H6123 Impacted cerumen, bilateral: Secondary | ICD-10-CM | POA: Diagnosis not present

## 2021-04-11 DIAGNOSIS — Z01118 Encounter for examination of ears and hearing with other abnormal findings: Secondary | ICD-10-CM | POA: Diagnosis not present

## 2021-04-13 ENCOUNTER — Encounter: Payer: Self-pay | Admitting: Internal Medicine

## 2021-04-16 ENCOUNTER — Ambulatory Visit (HOSPITAL_BASED_OUTPATIENT_CLINIC_OR_DEPARTMENT_OTHER): Payer: Medicare HMO | Admitting: Orthopaedic Surgery

## 2021-04-28 ENCOUNTER — Ambulatory Visit (HOSPITAL_BASED_OUTPATIENT_CLINIC_OR_DEPARTMENT_OTHER): Payer: Medicare HMO | Admitting: Orthopaedic Surgery

## 2021-04-28 ENCOUNTER — Encounter (HOSPITAL_BASED_OUTPATIENT_CLINIC_OR_DEPARTMENT_OTHER): Payer: Self-pay | Admitting: Physical Therapy

## 2021-04-28 ENCOUNTER — Ambulatory Visit (HOSPITAL_BASED_OUTPATIENT_CLINIC_OR_DEPARTMENT_OTHER): Payer: Medicare HMO | Attending: Orthopaedic Surgery | Admitting: Physical Therapy

## 2021-04-28 DIAGNOSIS — Y939 Activity, unspecified: Secondary | ICD-10-CM | POA: Diagnosis not present

## 2021-04-28 DIAGNOSIS — M25622 Stiffness of left elbow, not elsewhere classified: Secondary | ICD-10-CM | POA: Insufficient documentation

## 2021-04-28 DIAGNOSIS — W19XXXA Unspecified fall, initial encounter: Secondary | ICD-10-CM | POA: Diagnosis not present

## 2021-04-28 DIAGNOSIS — M25522 Pain in left elbow: Secondary | ICD-10-CM | POA: Diagnosis not present

## 2021-04-28 DIAGNOSIS — M6281 Muscle weakness (generalized): Secondary | ICD-10-CM | POA: Insufficient documentation

## 2021-04-28 DIAGNOSIS — S53442A Ulnar collateral ligament sprain of left elbow, initial encounter: Secondary | ICD-10-CM | POA: Insufficient documentation

## 2021-04-28 NOTE — Therapy (Addendum)
OUTPATIENT PHYSICAL THERAPY SHOULDER EVALUATION  PHYSICAL THERAPY DISCHARGE SUMMARY  Visits from Start of Care: 1  Plan: Patient agrees to discharge.  Patient goals were not met. Patient is being discharged due to not returning to therapy.       Patient Name: Victoria Neal MRN: [id number] DOB:[birth year], 66 y.o., female Today's Date: 04/28/2021   PT End of Session - 04/28/21 1427     Visit Number 1    Number of Visits 18    Date for PT Re-Evaluation 07/27/21    Authorization Type [employer] MCR    PT Start Time 1430    PT Stop Time 1506    PT Time Calculation (min) 36 min    Activity Tolerance Patient tolerated treatment well    Behavior During Therapy WFL for tasks assessed/performed             Past Medical History:  Diagnosis Date   Allergic rhinitis    Allergy    Arthritis    Cataract    DM (diabetes mellitus) ([place])    GERD (gastroesophageal reflux disease)    Glaucoma    ? of, but seeing optho and told recently not glaucoma   High cholesterol    HTN (hypertension)    Internal hemorrhoids    Osteoporosis    in 2010, has refused bisphosphnate treatment or treament other then vit d and calcium   Panic anxiety syndrome 01/03/2013   Pure hyperglyceridemia    Past Surgical History:  Procedure Laterality Date   CESAREAN SECTION     COLONOSCOPY  05/03/2006   internal hemorrhoids   COLONOSCOPY     OOPHORECTOMY Right 1999   Patient Active Problem List   Diagnosis Date Noted   Generalized osteoarthritis 08/13/2020   Preventative health care 01/19/2020   Advance directive discussed with patient 01/19/2020   Cerumen impaction 06/06/2019   Perineal cyst in female 06/06/2019   GERD (gastroesophageal reflux disease)    Osteopenia 08/27/2015   Type 2 diabetes mellitus with other circulatory complications ([place]) 84/69/6295   Essential hypertension, benign 09/27/2012   Hyperlipemia 09/02/2009    PCP: Venia Carbon, MD  REFERRING PROVIDER: Vanetta Mulders, MD  REFERRING DIAG: [phone] (ICD-10-CM) - Sprain of ulnar collateral ligament of left elbow, initial encounter  THERAPY DIAG:  Pain in left elbow  Stiffness of left elbow, not elsewhere classified  Muscle weakness (generalized)   ONSET DATE: 3/6  SUBJECTIVE:                                                                                                                                                                                      SUBJECTIVE STATEMENT:  Pt states she fell and landed on her L elbow and R knee after being pulled by her dog.  She landed  on her olecranon. She states that this time she has had bruising around the medial side of the elbow.  She has been using a compression wrap as well as icing the elbow. Pt had difficulty with washing hair, grooming, dressing, house work, cooking, cleaning, etc. She also has neck and back pain from protecting the L elbow. She states that pain mostly happens at night. She has used heat as well. She got an injection from the MD as well as Voltaren gel. She states the injection helped a little bit. She is able to move her elbow better but still has signficant pain. Pt denies NT. She states pain does not travel.  She tries not to take medication. She can only use her R hand. Anything on the L hand hurts.   PERTINENT HISTORY: DM, HTN  PAIN:  Are you having pain? Yes: NPRS scale: 6/10, Worst 7/10 Pain location: medial elbow Pain description: sharp pain Aggravating factors: picking up items, holding, bending fingers Relieving factors: heating  PRECAUTIONS: None  WEIGHT BEARING RESTRICTIONS No  FALLS:  Has patient fallen in last 6 months? Yes. 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 Pt states the heaviest thing to pick up are the trays of food.   PLOF: Independent  PATIENT GOALS  :  OBJECTIVE:   DIAGNOSTIC FINDINGS:   IMPRESSION: Mild superficial soft tissue swelling posteriorly. No evidence of acute fracture or joint effusion.   Mild ulnotrochlear degenerative change.  PATIENT SURVEYS:  FOTO 48 63 at D/C  3pts MCII  COGNITION:  Overall cognitive status: Within functional limits for tasks assessed     SENSATION: WFL  POSTURE: WFL  UPPER EXTREMITY ROM:   Active ROM Right 04/28/2021 Left 04/28/2021  Elbow flexion 140 100  Elbow extension 3 -25  Wrist flexion WNL WNL  p!  Wrist extension WNL WNL p!  Wrist ulnar deviation WNL WNL p!  Wrist radial deviation WNL WNL p!  Wrist pronation WNL WNL p!  Wrist supination WNL WNL p!  (Blank rows = not tested)  UPPER EXTREMITY MMT:  MMT Right 04/28/2021 Left 04/28/2021  elbow flexion 4+/5 3-/5 p!  elbow extension 4+/5 3-/5 p!  Wrist ulnar deviation 4+/5 4/5  Wrist radial deviation 4+/5 4/5  Wrist pronation 4+/5 4/5  Wrist supination 4+/5 4/5  Grip strength (lbs) Firm gripping without pain Unable to test due to pain- HHD test as able  (Blank rows = not tested)  SPECIAL TESTS:  Elbow valgus at 30: positive , unable to test at 0  Elbow varus at 30: positive, unable to test at 0    Tinel's at ulnar n.: positive (hypersensitive to touch, no distal symptoms)   JOINT MOBILITY TESTING:  Too sensitive to touch to test joint stiffness/laxity  PALPATION:  Significant TTP and mild edema noted at medial elbow, olecranon  Hypertonicity and TTP of wrist flexors and extensors, significant wrist flexor banding   TODAY'S TREATMENT:   Exercises - Seated Elbow Flexion and Extension AROM  - 2-3 x daily - 7 x weekly - 2 sets - 10 reps - Standing Wrist Extension Stretch  - 2-3 x daily - 7 x weekly - 1 sets - 3 reps - 30 hold - Standing Wrist Flexion Stretch  -  2-3 x daily - 7 x weekly - 1 sets - 3 reps - 30 hold - Bicep Stretch at Table  - 2-3 x daily - 7 x weekly - 1 sets - 5 reps - 10 hold   PATIENT  EDUCATION: Education details: MOI, diagnosis, prognosis, anatomy, exercise progression, DOMS expectations, muscle firing,  envelope of function, HEP, POC  Person educated: Patient Education method: Explanation, Demonstration, Tactile cues, Verbal cues, and Handouts Education comprehension: verbalized understanding, returned demonstration, verbal cues required, and tactile cues required   HOME EXERCISE PROGRAM: Access Code: [email or web address] URL: [email or web address] Date: 04/28/2021 Prepared by: Daleen Bo  ASSESSMENT:  CLINICAL IMPRESSION: Patient is a 66 y.o. female who was seen today for physical therapy evaluation and treatment for cc of L elbow pain. Pt's s/s appear consistent with blunt trauma soft tissue injury of the medial and lateral elbow. Pt's L common flexor tendon is especially sensitive with stretching and PROM. Pt's pain is highly sensitive and irritable. Pt's presentation and MOI does not appear consistent with internal derangement.  Pt would benefit from continued skilled therapy in order to reach goals and maximize functional R UE strength and ROM for full return to PLOF.    OBJECTIVE IMPAIRMENTS decreased activity tolerance, difficulty walking, decreased strength, hypomobility, increased edema, increased fascial restrictions, increased muscle spasms, impaired flexibility, impaired sensation, impaired UE functional use, improper body mechanics, postural dysfunction, and pain.   ACTIVITY LIMITATIONS cleaning, community activity, driving, meal prep, laundry, yard work, shopping, and exercise/recreation .   PERSONAL FACTORS Age, Fitness, Time since onset of injury/illness/exacerbation, and 1-2 comorbidities:    are also affecting patient's functional outcome.    REHAB POTENTIAL: Good  CLINICAL DECISION MAKING: Stable/uncomplicated  EVALUATION COMPLEXITY: Low   GOALS:   SHORT TERM GOALS: Target date: 06/09/2021  Pt will become independent with HEP in order to  demonstrate synthesis of PT education.  Goal status: INITIAL  2.  Pt will be able to demonstrate full AROM at the elbow with our without pain in order to demonstrate functional improvement in UE function for self-care and house hold duties.   Goal status: INITIAL  3.  Pt will be able to demonstrate ability to perform bicep curl without pain in order to demonstrate functional improvement in UE function for self-care and house hold duties.   Goal status: INITIAL  LONG TERM GOALS: Target date: 07/21/2021  Pt  will become independent with final HEP in order to demonstrate synthesis of PT education.   Goal status: INITIAL  2.  Pt will score >/= 63 on FOTO to demonstrate improvement in perceived L elbow function.   Goal status: INITIAL  3.  Pt will be able to demonstrate  ability to lift/carry >10lbs in order to demonstrate functional improvement in UE for return to PLOF.    Goal status: INITIAL   PLAN: PT FREQUENCY: 1-2x/week  PT DURATION: 12 weeks  PLANNED INTERVENTIONS: Therapeutic exercises, Therapeutic activity, Neuromuscular re-education, Balance training, Gait training, Patient/Family education, Joint manipulation, Joint mobilization, DME instructions, Aquatic Therapy, Dry Needling, Electrical stimulation, Spinal manipulation, Spinal mobilization, Cryotherapy, Moist heat, scar mobilization, Splintting, Taping, Vasopneumatic device, Traction, Ultrasound, Ionotophoresis 3m/ml Dexamethasone, and Manual therapy  PLAN FOR NEXT SESSION: review HEP, STM wrist flexors and extensors, DN PRN, Mulligan joint mob as tolerated   ADaleen Bo PT 04/28/2021, 3:13 PM

## 2021-05-05 ENCOUNTER — Ambulatory Visit (HOSPITAL_BASED_OUTPATIENT_CLINIC_OR_DEPARTMENT_OTHER): Payer: Medicare HMO | Admitting: Physical Therapy

## 2021-05-12 ENCOUNTER — Encounter (HOSPITAL_BASED_OUTPATIENT_CLINIC_OR_DEPARTMENT_OTHER): Payer: Medicare HMO | Admitting: Physical Therapy

## 2021-06-16 ENCOUNTER — Telehealth: Payer: Self-pay

## 2021-06-16 DIAGNOSIS — E1159 Type 2 diabetes mellitus with other circulatory complications: Secondary | ICD-10-CM

## 2021-06-16 MED ORDER — ONETOUCH DELICA LANCING DEV MISC: 1.0000 | Freq: Once | 0 refills | Status: AC

## 2021-06-16 MED ORDER — ONETOUCH VERIO VI STRP: ORAL_STRIP | 3 refills | Status: DC

## 2021-06-16 MED ORDER — ONETOUCH DELICA LANCETS 33G MISC: 1.0000 | Freq: Every day | 3 refills | Status: DC

## 2021-06-16 NOTE — Telephone Encounter (Signed)
Called pt to verify what she is needing. Said she needs Lancets and test strips. Not the kit.

## 2021-06-16 NOTE — Telephone Encounter (Signed)
MEDICATION: Blood Glucose Monitoring Suppl ([place] FLEX SYSTEM) w/Device KIT  PHARMACY: [id number] [place], [place] - [address]  Comments: Patient is completely out  **Let patient know to contact pharmacy at the end of the day to make sure medication is ready. **  ** Please notify patient to allow 48-72 hours to process**  **Encourage patient to contact the pharmacy for refills or they can [facility]*

## 2021-06-18 NOTE — Telephone Encounter (Signed)
Pt called and said that the pharmacy didn't receive the refill for the test strips. It looks like it might have been printed instead. Please look into

## 2021-06-19 MED ORDER — ONETOUCH VERIO VI STRP: ORAL_STRIP | 3 refills | Status: DC

## 2021-06-19 NOTE — Addendum Note (Signed)
Addended by: Brenton Grills on: 5/74/9355 21:74 PM   Modules accepted: Orders

## 2021-06-19 NOTE — Telephone Encounter (Signed)
Spoke with pt confirming she needs refill on test strips. E-scribed ref[facility] to [facility].  Pt expresses her thanks.

## 2021-08-26 ENCOUNTER — Ambulatory Visit (INDEPENDENT_AMBULATORY_CARE_PROVIDER_SITE_OTHER): Payer: Medicare HMO | Admitting: Internal Medicine

## 2021-08-26 ENCOUNTER — Encounter: Payer: Self-pay | Admitting: Internal Medicine

## 2021-08-26 VITALS — BP 110/70 | HR 64 | Temp 97.6°F | Ht 63.5 in | Wt 133.0 lb

## 2021-08-26 DIAGNOSIS — E1159 Type 2 diabetes mellitus with other circulatory complications: Secondary | ICD-10-CM

## 2021-08-26 DIAGNOSIS — I10 Essential (primary) hypertension: Secondary | ICD-10-CM | POA: Diagnosis not present

## 2021-08-26 LAB — POCT GLYCOSYLATED HEMOGLOBIN (HGB A1C): Hemoglobin A1C: 7.1 % — AB (ref 4.0–5.6)

## 2021-08-26 NOTE — Progress Notes (Signed)
Subjective:    Patient ID: Victoria Neal, female    DOB: 1956/01/02, 66 y.o.   MRN: [id number]  HPI Here for follow up of diabetes  Doing well except for leg cramps--mostly left Takes a few minutes to resolve Only at night Twice this week---but usually not that much Does limit drinking when at work--to limit voiding  Checks sugars once a week Usually 140---but after breakfast Has been exercising a lot--at Y Careful about eating  Current Outpatient Medications on File Prior to Visit  Medication Sig Dispense Refill   amLODipine (NORVASC) 5 MG tablet Take 1 tablet (5 mg total) by mouth daily. 90 tablet 3   Blood Glucose Monitoring Suppl ([place]) w/Device KIT Use as directed to check blood sugar once a day 1 kit 0   glucose blood (ONETOUCH VERIO) test strip Use to check blood sugar once daily. 100 each 3   losartan (COZAAR) 25 MG tablet Take 1 tablet (25 mg total) by mouth daily. 90 tablet 3   metFORMIN (GLUCOPHAGE) 500 MG tablet TAKE 1 TABLET BY MOUTH EVERY DAY WITH BREAKFAST 90 tablet 3   OneTouch Delica Lancets 38B MISC 1 each by Does not apply route daily. Use to obtain blood sugar sample once daily. E11.59 100 each 3   rosuvastatin (CRESTOR) 10 MG tablet Take 1 tablet (10 mg total) by mouth daily. 90 tablet 3   Specialty Vitamins Products (ONE-A-DAY BONE STRENGTH PO) Take 1 tablet by mouth daily.      No current facility-administered medications on file prior to visit.    Allergies  Allergen Reactions   Aspirin     REACTION: Swelling   Lisinopril Cough   Statins     Muscle cramps; pravastatin ok    Past Medical History:  Diagnosis Date   Allergic rhinitis    Allergy    Arthritis    Cataract    DM (diabetes mellitus) ([facility])    GERD (gastroesophageal reflux disease)    Glaucoma    ? of, but seeing optho and told recently not glaucoma   High cholesterol    HTN (hypertension)    Internal hemorrhoids    Osteoporosis    in 2010, has refused  bisphosphnate treatment or treament other then vit d and calcium   Panic anxiety syndrome 01/03/2013   Pure hyperglyceridemia     Past Surgical History:  Procedure Laterality Date   CESAREAN SECTION     COLONOSCOPY  05/03/2006   internal hemorrhoids   COLONOSCOPY     OOPHORECTOMY Right 1999    Family History  Problem Relation Age of Onset   Coronary artery disease Maternal Uncle    Lung cancer Sister    Cancer Sister        lung    Pancreatic cancer Sister    Breast cancer Maternal Aunt    Colon cancer Mother 50       deceased age 77   Hypertension Mother    Diabetes Father    Hypertension Father    Diabetes Paternal Aunt    Esophageal cancer Neg Hx    Liver cancer Neg Hx    Rectal cancer Neg Hx    Stomach cancer Neg Hx    Colon polyps Neg Hx     Social History   Socioeconomic History   Marital status: Married    Spouse name: Not on file   Number of children: 4   Years of education: Not on file   Highest  education level: Not on file  Occupational History   Occupation: Pharmacist, hospital-- elementary    Comment: Retired  Tobacco Use   Smoking status: Never    Passive exposure: Past   Smokeless tobacco: Never  Vaping Use   Vaping Use: Never used  Substance and Sexual Activity   Alcohol use: Yes    Comment: occ - very rare   Drug use: No   Sexual activity: Not on file  Other Topics Concern   Not on file  Social History Narrative   Work or School: 2nd grade at [employer] Situation: lives with husband and son - has 4 children, son in Facilities manager school, son animation, daughter is Pension scheme manager      Spiritual Beliefs: Baha'i faith - all religions are one, prayer based, no health care restrictions      No living will   Husband, then children, as health care POA   Would accept resuscitation   Would accept tube feeding at least short term--no sure about longer      Social Determinants of Health   Financial Resource Strain: Not on file  Food Insecurity: Not on  file  Transportation Needs: Not on file  Physical Activity: Not on file  Stress: Not on file  Social Connections: Not on file  Intimate Partner Violence: Not on file   Review of Systems Weight up slightly No chest pain or SOB     Objective:   Physical Exam Constitutional:      Appearance: Normal appearance.  Cardiovascular:     Rate and Rhythm: Normal rate and regular rhythm.     Pulses: Normal pulses.     Heart sounds: No murmur heard.    No gallop.  Pulmonary:     Effort: Pulmonary effort is normal.     Breath sounds: Normal breath sounds. No wheezing or rales.  Musculoskeletal:     Cervical back: Neck supple.     Right lower leg: No edema.     Left lower leg: No edema.  Lymphadenopathy:     Cervical: No cervical adenopathy.  Skin:    Comments: No foot lesions  Neurological:     Mental Status: She is alert.            Assessment & Plan:

## 2021-08-26 NOTE — Assessment & Plan Note (Signed)
BP Readings from Last 3 Encounters:  08/26/21 110/70  02/25/21 138/84  08/13/20 122/80   Good control on losartan 25 and amlodipine 5

## 2021-08-26 NOTE — Assessment & Plan Note (Signed)
Lab Results  Component Value Date   HGBA1C 7.1 (A) 08/26/2021   Still good control on just metformin 500 daily Didn't do well on higher dose Will hold off on other meds

## 2021-10-01 ENCOUNTER — Other Ambulatory Visit: Payer: Self-pay | Admitting: Internal Medicine

## 2021-10-01 DIAGNOSIS — Z1231 Encounter for screening mammogram for malignant neoplasm of breast: Secondary | ICD-10-CM

## 2021-11-11 DIAGNOSIS — Z1283 Encounter for screening for malignant neoplasm of skin: Secondary | ICD-10-CM | POA: Diagnosis not present

## 2021-11-11 DIAGNOSIS — D225 Melanocytic nevi of trunk: Secondary | ICD-10-CM | POA: Diagnosis not present

## 2021-11-17 ENCOUNTER — Ambulatory Visit: Payer: Medicare HMO

## 2021-11-26 DIAGNOSIS — Z7984 Long term (current) use of oral hypoglycemic drugs: Secondary | ICD-10-CM | POA: Diagnosis not present

## 2021-11-26 DIAGNOSIS — Z833 Family history of diabetes mellitus: Secondary | ICD-10-CM | POA: Diagnosis not present

## 2021-11-26 DIAGNOSIS — E119 Type 2 diabetes mellitus without complications: Secondary | ICD-10-CM | POA: Diagnosis not present

## 2021-11-26 DIAGNOSIS — Z800 Family history of malignant neoplasm of digestive organs: Secondary | ICD-10-CM | POA: Diagnosis not present

## 2021-11-26 DIAGNOSIS — Z8249 Family history of ischemic heart disease and other diseases of the circulatory system: Secondary | ICD-10-CM | POA: Diagnosis not present

## 2021-11-26 DIAGNOSIS — I10 Essential (primary) hypertension: Secondary | ICD-10-CM | POA: Diagnosis not present

## 2021-11-26 DIAGNOSIS — R32 Unspecified urinary incontinence: Secondary | ICD-10-CM | POA: Diagnosis not present

## 2021-11-26 DIAGNOSIS — K219 Gastro-esophageal reflux disease without esophagitis: Secondary | ICD-10-CM | POA: Diagnosis not present

## 2021-11-26 DIAGNOSIS — E785 Hyperlipidemia, unspecified: Secondary | ICD-10-CM | POA: Diagnosis not present

## 2021-11-26 DIAGNOSIS — M1990 Unspecified osteoarthritis, unspecified site: Secondary | ICD-10-CM | POA: Diagnosis not present

## 2021-12-18 ENCOUNTER — Ambulatory Visit
Admission: RE | Admit: 2021-12-18 | Discharge: 2021-12-18 | Disposition: A | Discharge status: Home / Self Care | Payer: Medicare HMO | Source: Ambulatory Visit | Attending: Internal Medicine | Admitting: Internal Medicine

## 2021-12-18 DIAGNOSIS — Z1231 Encounter for screening mammogram for malignant neoplasm of breast: Secondary | ICD-10-CM | POA: Diagnosis not present

## 2022-02-14 DIAGNOSIS — R69 Illness, unspecified: Secondary | ICD-10-CM | POA: Diagnosis not present

## 2022-02-17 DIAGNOSIS — E119 Type 2 diabetes mellitus without complications: Secondary | ICD-10-CM | POA: Diagnosis not present

## 2022-02-17 DIAGNOSIS — H5213 Myopia, bilateral: Secondary | ICD-10-CM | POA: Diagnosis not present

## 2022-02-17 DIAGNOSIS — H25813 Combined forms of age-related cataract, bilateral: Secondary | ICD-10-CM | POA: Diagnosis not present

## 2022-02-17 DIAGNOSIS — H04123 Dry eye syndrome of bilateral lacrimal glands: Secondary | ICD-10-CM | POA: Diagnosis not present

## 2022-02-17 LAB — HM DIABETES EYE EXAM

## 2022-03-03 ENCOUNTER — Encounter: Payer: Medicare HMO | Admitting: Internal Medicine

## 2022-03-04 ENCOUNTER — Encounter: Payer: Medicare HMO | Admitting: Internal Medicine

## 2022-03-05 ENCOUNTER — Ambulatory Visit (INDEPENDENT_AMBULATORY_CARE_PROVIDER_SITE_OTHER): Payer: Medicare HMO | Admitting: Internal Medicine

## 2022-03-05 ENCOUNTER — Encounter: Payer: Self-pay | Admitting: Internal Medicine

## 2022-03-05 VITALS — BP 118/80 | HR 63 | Temp 97.4°F | Ht 63.0 in | Wt 133.0 lb

## 2022-03-05 DIAGNOSIS — E1159 Type 2 diabetes mellitus with other circulatory complications: Secondary | ICD-10-CM | POA: Diagnosis not present

## 2022-03-05 DIAGNOSIS — Z0000 Encounter for general adult medical examination without abnormal findings: Secondary | ICD-10-CM

## 2022-03-05 DIAGNOSIS — E785 Hyperlipidemia, unspecified: Secondary | ICD-10-CM | POA: Diagnosis not present

## 2022-03-05 DIAGNOSIS — I10 Essential (primary) hypertension: Secondary | ICD-10-CM | POA: Diagnosis not present

## 2022-03-05 LAB — COMPREHENSIVE METABOLIC PANEL
ALT: 15 U/L (ref 0–35)
AST: 17 U/L (ref 0–37)
Albumin: 4.3 g/dL (ref 3.5–5.2)
Alkaline Phosphatase: 77 U/L (ref 39–117)
BUN: 17 mg/dL (ref 6–23)
CO2: 27 mEq/L (ref 19–32)
Calcium: 9.7 mg/dL (ref 8.4–10.5)
Chloride: 103 mEq/L (ref 96–112)
Creatinine, Ser: 0.65 mg/dL (ref 0.40–1.20)
GFR: 90.59 mL/min (ref >60.00)
Glucose, Bld: 145 mg/dL — ABNORMAL HIGH (ref 70–99)
Potassium: 4.3 mEq/L (ref 3.5–5.1)
Sodium: 139 mEq/L (ref 135–145)
Total Bilirubin: 0.4 mg/dL (ref 0.2–1.2)
Total Protein: 7.4 g/dL (ref 6.0–8.3)

## 2022-03-05 LAB — MICROALBUMIN / CREATININE URINE RATIO
Creatinine,U: 71.5 mg/dL
Microalb Creat Ratio: 1 mg/g (ref 0.0–30.0)
Microalb, Ur: <0.7 mg/dL (ref 0.0–1.9)

## 2022-03-05 LAB — LIPID PANEL
Cholesterol: 178 mg/dL (ref 0–200)
HDL: 82.3 mg/dL (ref >39.00)
LDL Cholesterol: 66 mg/dL (ref 0–99)
NonHDL: 95.56
Total CHOL/HDL Ratio: 2
Triglycerides: 146 mg/dL (ref 0.0–149.0)
VLDL: 29.2 mg/dL (ref 0.0–40.0)

## 2022-03-05 LAB — CBC
HCT: 41.9 % (ref 36.0–46.0)
Hemoglobin: 13.8 g/dL (ref 12.0–15.0)
MCHC: 32.9 g/dL (ref 30.0–36.0)
MCV: 87.9 fl (ref 78.0–100.0)
Platelets: 273 10*3/uL (ref 150.0–400.0)
RBC: 4.76 Mil/uL (ref 3.87–5.11)
RDW: 13.7 % (ref 11.5–15.5)
WBC: 4.8 10*3/uL (ref 4.0–10.5)

## 2022-03-05 LAB — HM DIABETES FOOT EXAM

## 2022-03-05 NOTE — Assessment & Plan Note (Addendum)
BP Readings from Last 3 Encounters:  03/05/22 118/80  08/26/21 110/70  02/25/21 138/84   Good control on losartan '25mg'$  and amlodipine '5mg'$ 

## 2022-03-05 NOTE — Assessment & Plan Note (Signed)
No problems with rosuvastatin 10

## 2022-03-05 NOTE — Progress Notes (Signed)
Subjective:    Patient ID: Victoria Neal, female    DOB: 1955/11/20, 67 y.o.   MRN: [id number]  HPI Here for Medicare wellness visit and follow up of chronic health conditions Reviewed advanced directives Reviewed other doctors---Dr Bassiri--ophthal, Dr Eligha Bridegroom, Dr Darrick Penna, Son is her dentist, Dr Georgia Dom, Dr Delrae Rend, Dr Elder Cyphers No hospitalizations or surgery Worsening cataract --will need removal soon Hearing is okay Rare alcohol No tobacco Exercises regularly No falls No depression or anhedonia Independent with instrumental ADLs No memory problems  Has noticed frequent nocturia--uses pad for some urge leakage Only if she drinks after 8PM Usually just once a day No daytime problems  Concerned due to FH--sister died of lung cancer (non smoker) No real symptoms  Not checking sugars much Usually 120's No foot numbness or burning  No chest pain No SOB No dizziness or syncope No edema No palpitations  Current Outpatient Medications on File Prior to Visit  Medication Sig Dispense Refill   amLODipine (NORVASC) 5 MG tablet Take 1 tablet (5 mg total) by mouth daily. 90 tablet 3   Blood Glucose Monitoring Suppl ([place]) w/Device KIT Use as directed to check blood sugar once a day 1 kit 0   glucose blood (ONETOUCH VERIO) test strip Use to check blood sugar once daily. 100 each 3   losartan (COZAAR) 25 MG tablet Take 1 tablet (25 mg total) by mouth daily. 90 tablet 3   metFORMIN (GLUCOPHAGE) 500 MG tablet TAKE 1 TABLET BY MOUTH EVERY DAY WITH BREAKFAST 90 tablet 3   OneTouch Delica Lancets 99991111 MISC 1 each by Does not apply route daily. Use to obtain blood sugar sample once daily. E11.59 100 each 3   rosuvastatin (CRESTOR) 10 MG tablet Take 1 tablet (10 mg total) by mouth daily. 90 tablet 3   Specialty Vitamins Products (ONE-A-DAY BONE STRENGTH PO) Take 1 tablet by mouth daily.      No current facility-administered  medications on file prior to visit.    Allergies  Allergen Reactions   Aspirin     REACTION: Swelling   Lisinopril Cough   Statins     Muscle cramps; pravastatin ok    Past Medical History:  Diagnosis Date   Allergic rhinitis    Allergy    Arthritis    Cataract    DM (diabetes mellitus) ([facility])    GERD (gastroesophageal reflux disease)    Glaucoma    ? of, but seeing optho and told recently not glaucoma   High cholesterol    HTN (hypertension)    Internal hemorrhoids    Osteoporosis    in 2010, has refused bisphosphnate treatment or treament other then vit d and calcium   Panic anxiety syndrome 01/03/2013   Pure hyperglyceridemia     Past Surgical History:  Procedure Laterality Date   CESAREAN SECTION     COLONOSCOPY  05/03/2006   internal hemorrhoids   COLONOSCOPY     EYE SURGERY     OOPHORECTOMY Right 1999    Family History  Problem Relation Age of Onset   Coronary artery disease Maternal Uncle    Lung cancer Sister    Cancer Sister        lung    Pancreatic cancer Sister    Breast cancer Maternal Aunt    Colon cancer Mother 63       deceased age 46   Hypertension Mother    Diabetes Father    Hypertension Father  Diabetes Paternal Aunt    Esophageal cancer Neg Hx    Liver cancer Neg Hx    Rectal cancer Neg Hx    Stomach cancer Neg Hx    Colon polyps Neg Hx     Social History   Socioeconomic History   Marital status: Married    Spouse name: Not on file   Number of children: 4   Years of education: Not on file   Highest education level: Not on file  Occupational History   Occupation: Pharmacist, hospital-- elementary    Comment: Retired  Tobacco Use   Smoking status: Never    Passive exposure: Past   Smokeless tobacco: Never  Vaping Use   Vaping Use: Never used  Substance and Sexual Activity   Alcohol use: Yes    Comment: occ - very rare   Drug use: No   Sexual activity: Not on file  Other Topics Concern   Not on file  Social History Narrative    Work or School: 2nd grade at [employer] Situation: lives with husband and son - has 4 children, son in Facilities manager school, son animation, daughter is Pension scheme manager      Spiritual Beliefs: Baha'i faith - all religions are one, prayer based, no health care restrictions      No living will   Husband, then children, as health care POA   Would accept resuscitation   Would accept tube feeding at least short term--no sure about longer      Social Determinants of Health   Financial Resource Strain: Not on file  Food Insecurity: Not on file  Transportation Needs: Not on file  Physical Activity: Not on file  Stress: Not on file  Social Connections: Not on file  Intimate Partner Violence: Not on file   Review of Systems Appetite is good Weight stable Wears seat belt Sleeps well Partial dentures--keeps up with dentist No suspicious skin lesions No heartburn --but occ bloating (oranges help). No dysphagia Bowels move fine--no blood No sig back pain. Some pains in hands. Occasional leg cramps at night    Objective:   Physical Exam Constitutional:      Appearance: Normal appearance.  HENT:     Mouth/Throat:     Pharynx: No oropharyngeal exudate or posterior oropharyngeal erythema.  Eyes:     Conjunctiva/sclera: Conjunctivae normal.     Pupils: Pupils are equal, round, and reactive to light.  Cardiovascular:     Rate and Rhythm: Normal rate and regular rhythm.     Pulses: Normal pulses.     Heart sounds: No murmur heard.    No gallop.  Pulmonary:     Effort: Pulmonary effort is normal.     Breath sounds: Normal breath sounds. No wheezing or rales.  Abdominal:     Palpations: Abdomen is soft.     Tenderness: There is no abdominal tenderness.  Musculoskeletal:     Cervical back: Neck supple.     Right lower leg: No edema.     Left lower leg: No edema.  Lymphadenopathy:     Cervical: No cervical adenopathy.  Skin:    Findings: No rash.  Neurological:     General: No  focal deficit present.     Mental Status: She is alert and oriented to person, place, and time.     Comments: Word naming 13/30 seconds Recall 3/3 Normal sensation in feet  Psychiatric:        Mood and Affect: Mood normal.  Behavior: Behavior normal.            Assessment & Plan:

## 2022-03-05 NOTE — Assessment & Plan Note (Signed)
Seems to still have good control on the metformin 500 daily

## 2022-03-05 NOTE — Assessment & Plan Note (Signed)
I have personally reviewed the Medicare Annual Wellness questionnaire and have noted 1. The patient's medical and social history 2. Their use of alcohol, tobacco or illicit drugs 3. Their current medications and supplements 4. The patient's functional ability including ADL's, fall risks, home safety risks and hearing or visual             impairment. 5. Diet and physical activities 6. Evidence for depression or mood disorders  The patients weight, height, BMI and visual acuity have been recorded in the chart I have made referrals, counseling and provided education to the patient based review of the above and I have provided the pt with a written personalized care plan for preventive services.  I have provided you with a copy of your personalized plan for preventive services. Please take the time to review along with your updated medication list.  Yearly mammogram till 73--just had Colon due 2028 No pap due to age Exercises regularly Shingrix at pharmacy Flu/COVID/RSV for the fall

## 2022-03-06 ENCOUNTER — Other Ambulatory Visit: Payer: Self-pay

## 2022-03-06 LAB — HEMOGLOBIN A1C: Hgb A1c MFr Bld: 7.9 % — ABNORMAL HIGH (ref 4.6–6.5)

## 2022-03-06 MED ORDER — METFORMIN HCL 500 MG PO TABS: 500.0000 mg | ORAL_TABLET | Freq: Two times a day (BID) | ORAL | 3 refills | Status: DC

## 2022-03-13 ENCOUNTER — Other Ambulatory Visit: Payer: Self-pay | Admitting: Internal Medicine

## 2022-03-13 DIAGNOSIS — E119 Type 2 diabetes mellitus without complications: Secondary | ICD-10-CM

## 2022-03-14 ENCOUNTER — Other Ambulatory Visit: Payer: Self-pay | Admitting: Internal Medicine

## 2022-03-14 DIAGNOSIS — E1159 Type 2 diabetes mellitus with other circulatory complications: Secondary | ICD-10-CM

## 2022-04-06 ENCOUNTER — Encounter: Payer: Self-pay | Admitting: Internal Medicine

## 2022-04-07 ENCOUNTER — Telehealth: Payer: Medicare HMO | Admitting: Physician Assistant

## 2022-04-07 DIAGNOSIS — J069 Acute upper respiratory infection, unspecified: Secondary | ICD-10-CM

## 2022-04-07 MED ORDER — BENZONATATE 100 MG PO CAPS: 100.0000 mg | ORAL_CAPSULE | Freq: Three times a day (TID) | ORAL | 0 refills | Status: DC | PRN

## 2022-04-07 MED ORDER — FLUTICASONE PROPIONATE 50 MCG/ACT NA SUSP: 2.0000 | Freq: Every day | NASAL | 0 refills | Status: DC

## 2022-04-07 NOTE — Progress Notes (Signed)
Virtual Visit Consent   Victoria Neal, you are scheduled for a virtual visit with a [facility] provider today. Just as with appointments in the office, your consent must be obtained to participate. Your consent will be active for this visit and any virtual visit you may have with one of our providers in the next 365 days. If you have a MyChart account, a copy of this consent can be sent to you electronically.  As this is a virtual visit, video technology does not allow for your provider to perform a traditional examination. This may limit your provider's ability to fully assess your condition. If your provider identifies any concerns that need to be evaluated in person or the need to arrange testing (such as labs, EKG, etc.), we will make arrangements to do so. Although advances in technology are sophisticated, we cannot ensure that it will always work on either your end or our end. If the connection with a video visit is poor, the visit may have to be switched to a telephone visit. With either a video or telephone visit, we are not always able to ensure that we have a secure connection.  By engaging in this virtual visit, you consent to the provision of healthcare and authorize for your insurance to be billed (if applicable) for the services provided during this visit. Depending on your insurance coverage, you may receive a charge related to this service.  I need to obtain your verbal consent now. Are you willing to proceed with your visit today? Victoria Neal has provided verbal consent on 04/07/2022 for a virtual visit (video or telephone). [name], [place]  Date: 04/07/2022 12:35 PM  Virtual Visit via Video Note   I, [name], connected with  Victoria Neal  ([id number], [birth year]) on 04/07/22 at 12:30 PM EDT by a video-enabled telemedicine application and verified that I am speaking with the correct person using two identifiers.  Location: Patient: Virtual Visit  Location Patient: Home Provider: Virtual Visit Location Provider: Home Office   I discussed the limitations of evaluation and management by telemedicine and the availability of in person appointments. The patient expressed understanding and agreed to proceed.    History of Present Illness: Victoria Neal is a 67 y.o. who identifies as a female who was assigned female at birth, and is being seen today for URI symptoms starting last Thursday with nasal congestion, sinus pressure, headache and cough. Took home COVID test yesterday which was negative. Cough is now productive but is clear. Denies fever, chills. Is taking OTC cough medication with little improvement.   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   GERD (gastroesophageal reflux disease)    Osteopenia 08/27/2015   Type 2 diabetes mellitus with other circulatory complications 123456   Essential hypertension, benign 09/27/2012   Hyperlipemia 09/02/2009    Allergies:  Allergies  Allergen Reactions   Aspirin     REACTION: Swelling   Lisinopril Cough   Statins     Muscle cramps; pravastatin ok   Medications:  Current Outpatient Medications:    benzonatate (TESSALON) 100 MG capsule, Take 1 capsule (100 mg total) by mouth 3 (three) times daily as needed for cough., Disp: 30 capsule, Rfl: 0   fluticasone (FLONASE) 50 MCG/ACT nasal spray, Place 2 sprays  into both nostrils daily., Disp: 16 g, Rfl: 0   metFORMIN (GLUCOPHAGE) 500 MG tablet, Take 1 tablet (500 mg total) by mouth 2 (two) times daily with a meal., Disp: 180 tablet, Rfl: 3   amLODipine (NORVASC) 5 MG tablet, TAKE 1 TABLET (5 MG TOTAL) BY MOUTH DAILY., Disp: 90 tablet, Rfl: 3   Blood Glucose Monitoring Suppl (ONETOUCH VERIO FLEX SYSTEM) w/Device KIT, Use as directed to  check blood sugar once a day, Disp: 1 kit, Rfl: 0   glucose blood (ONETOUCH VERIO) test strip, Use to check blood sugar once daily., Disp: 100 each, Rfl: 3   OneTouch Delica Lancets 99991111 MISC, 1 each by Does not apply route daily. Use to obtain blood sugar sample once daily. E11.59, Disp: 100 each, Rfl: 3   rosuvastatin (CRESTOR) 10 MG tablet, TAKE 1 TABLET BY MOUTH EVERY DAY, Disp: 90 tablet, Rfl: 3   Specialty Vitamins Products (ONE-A-DAY BONE STRENGTH PO), Take 1 tablet by mouth daily. , Disp: , Rfl:   Observations/Objective: Patient is well-developed, well-nourished in no acute distress.  Resting comfortably  at home.  Head is normocephalic, atraumatic.  No labored breathing. Speech is clear and coherent with logical content.  Patient is alert and oriented at baseline.   Assessment and Plan: 1. Viral URI with cough - fluticasone (FLONASE) 50 MCG/ACT nasal spray; Place 2 sprays into both nostrils daily.  Dispense: 16 g; Refill: 0 - benzonatate (TESSALON) 100 MG capsule; Take 1 capsule (100 mg total) by mouth 3 (three) times daily as needed for cough.  Dispense: 30 capsule; Refill: 0  Rx Tessalon as directed.  Increase fluids.  Rest.  Saline nasal spray.  Probiotic.  Mucinex as directed.  Humidifier in bedroom. Flonase per orders. Continue Coricidin.  Call or return to clinic if symptoms are not improving.  Follow Up Instructions: I discussed the assessment and treatment plan with the patient. The patient was provided an opportunity to ask questions and all were answered. The patient agreed with the plan and demonstrated an understanding of the instructions.  A copy of instructions were sent to the patient via MyChart unless otherwise noted below.   The patient was advised to call back or seek an in-person evaluation if the symptoms worsen or if the condition fails to improve as anticipated.  Time:  I spent 10 minutes with the patient via telehealth technology discussing the above  problems/concerns.    [name], PA-C

## 2022-04-07 NOTE — Patient Instructions (Signed)
Victoria Neal, thank you for joining [name], PA-C for today's virtual visit.  While this provider is not your primary care provider (PCP), if your PCP is located in our provider database this encounter information will be shared with them immediately following your visit.   [facility] account gives you access to today's visit and all your visits, tests, and labs performed at [facility] " click here if you don't have a [facility] account or go to [email or web address]  Consent: (Patient) Solomiya Gaier Kitamura provided verbal consent for this virtual visit at the beginning of the encounter.  Current Medications:  Current Outpatient Medications:    metFORMIN (GLUCOPHAGE) 500 MG tablet, Take 1 tablet (500 mg total) by mouth 2 (two) times daily with a meal., Disp: 180 tablet, Rfl: 3   amLODipine (NORVASC) 5 MG tablet, TAKE 1 TABLET (5 MG TOTAL) BY MOUTH DAILY., Disp: 90 tablet, Rfl: 3   Blood Glucose Monitoring Suppl (ONETOUCH VERIO FLEX SYSTEM) w/Device KIT, Use as directed to check blood sugar once a day, Disp: 1 kit, Rfl: 0   glucose blood (ONETOUCH VERIO) test strip, Use to check blood sugar once daily., Disp: 100 each, Rfl: 3   OneTouch Delica Lancets 99991111 MISC, 1 each by Does not apply route daily. Use to obtain blood sugar sample once daily. E11.59, Disp: 100 each, Rfl: 3   rosuvastatin (CRESTOR) 10 MG tablet, TAKE 1 TABLET BY MOUTH EVERY DAY, Disp: 90 tablet, Rfl: 3   Specialty Vitamins Products (ONE-A-DAY BONE STRENGTH PO), Take 1 tablet by mouth daily. , Disp: , Rfl:    Medications ordered in this encounter:  No orders of the defined types were placed in this encounter.    *If you need refills on other medications prior to your next appointment, please contact your pharmacy*  Follow-Up: Call back or seek an in-person evaluation if the symptoms worsen or if the condition fails to improve as anticipated.  [facility] [phone]  Other Instructions  We are sorry you are not feeling well.  Here is how we plan to help!  Based on what you have shared with me, it looks like you may have a viral upper respiratory infection.  Upper respiratory infections are caused by a large number of viruses; however, rhinovirus is the most common cause.   Symptoms vary from person to person, with common symptoms including sore throat, cough, fatigue or lack of energy and feeling of general discomfort.  A low-grade fever of up to 100.4 may present, but is often uncommon.  Symptoms vary however, and are closely related to a person's age or underlying illnesses.  The most common symptoms associated with an upper respiratory infection are nasal discharge or congestion, cough, sneezing, headache and pressure in the ears and face.  These symptoms usually persist for about 3 to 10 days, but can last up to 2 weeks.  It is important to know that upper respiratory infections do not cause serious illness or complications in most cases.    Upper respiratory infections can be transmitted from person to person, with the most common method of transmission being a person's hands.  The virus is able to live on the skin and can infect other persons for up to 2 hours after direct contact.  Also, these can be transmitted when someone coughs or sneezes; thus, it is important to cover the mouth to reduce this risk.  To keep the spread of the illness  at [place], good hand hygiene is very important.  This is an infection that is most likely caused by a virus. There are no specific treatments other than to help you with the symptoms until the infection runs its course.  We are sorry you are not feeling well.  Here is how we plan to help!   For nasal congestion, you may use an oral decongestants such as Mucinex D or if you have glaucoma or high blood pressure use plain Mucinex.  Saline nasal spray or nasal drops can help and can safely be used as often as needed for  congestion.  For your congestion, I have prescribed Fluticasone nasal spray one spray in each nostril twice a day  If you do not have a history of heart disease, hypertension, diabetes or thyroid disease, prostate/bladder issues or glaucoma, you may also use Sudafed to treat nasal congestion.  It is highly recommended that you consult with a pharmacist or your primary care physician to ensure this medication is safe for you to take.     If you have a cough, you may use cough suppressants such as Delsym and Robitussin.  If you have glaucoma or high blood pressure, you can also use Coricidin HBP.   For cough I have prescribed for you A prescription cough medication called Tessalon Perles 100 mg. You may take 1-2 capsules every 8 hours as needed for cough  If you have a sore or scratchy throat, use a saltwater gargle-  to  teaspoon of salt dissolved in a 4-ounce to 8-ounce glass of warm water.  Gargle the solution for approximately 15-30 seconds and then spit.  It is important not to swallow the solution.  You can also use throat lozenges/cough drops and Chloraseptic spray to help with throat pain or discomfort.  Warm or cold liquids can also be helpful in relieving throat pain.  For headache, pain or general discomfort, you can use Ibuprofen or Tylenol as directed.   Some authorities believe that zinc sprays or the use of Echinacea may shorten the course of your symptoms.   HOME CARE Only take medications as instructed by your medical team. Be sure to drink plenty of fluids. Water is fine as well as fruit juices, sodas and electrolyte beverages. You may want to stay away from caffeine or alcohol. If you are nauseated, try taking small sips of liquids. How do you know if you are getting enough fluid? Your urine should be a pale yellow or almost colorless. Get rest. Taking a steamy shower or using a humidifier may help nasal congestion and ease sore throat pain. You can place a towel over your head  and breathe in the steam from hot water coming from a faucet. Using a saline nasal spray works much the same way. Cough drops, hard candies and sore throat lozenges may ease your cough. Avoid close contacts especially the very young and the elderly Cover your mouth if you cough or sneeze Always remember to wash your hands.   GET HELP RIGHT AWAY IF: You develop worsening fever. If your symptoms do not improve within 10 days You develop yellow or green discharge from your nose over 3 days. You have coughing fits You develop a severe head ache or visual changes. You develop shortness of breath, difficulty breathing or start having chest pain Your symptoms persist after you have completed your treatment plan  MAKE SURE YOU  Understand these instructions. Will watch your condition. Will get help right away if  you are not doing well or get worse.    If you have been instructed to have an in-person evaluation today at a local Urgent Care facility, please use the link below. It will take you to a list of all of our available [facility], including address, phone number and hours of operation. Please do not delay care.  [facility]  If you or a family member do not have a primary care provider, use the link below to schedule a visit and establish care. When you choose a [facility] primary care physician or advanced practice provider, you gain a long-term partner in health. Find a Primary Care Provider  Learn more about [facility]'s in-office and virtual care options: [facility]

## 2022-04-09 ENCOUNTER — Encounter: Payer: Self-pay | Admitting: Internal Medicine

## 2022-04-09 ENCOUNTER — Ambulatory Visit (INDEPENDENT_AMBULATORY_CARE_PROVIDER_SITE_OTHER): Payer: Medicare HMO | Admitting: Internal Medicine

## 2022-04-09 VITALS — BP 122/80 | HR 76 | Temp 97.2°F | Ht 63.0 in | Wt 133.0 lb

## 2022-04-09 DIAGNOSIS — G4730 Sleep apnea, unspecified: Secondary | ICD-10-CM | POA: Insufficient documentation

## 2022-04-09 DIAGNOSIS — J069 Acute upper respiratory infection, unspecified: Secondary | ICD-10-CM | POA: Insufficient documentation

## 2022-04-09 MED ORDER — AMOXICILLIN 500 MG PO TABS: 1000.0000 mg | ORAL_TABLET | Freq: Two times a day (BID) | ORAL | 0 refills | Status: AC

## 2022-04-09 NOTE — Progress Notes (Signed)
Subjective:    Patient ID: Victoria Neal, female    DOB: 05/01/1955, 67 y.o.   MRN: [id number]  HPI Here with husband due to respiratory illness and other concerns  Has been sick for a week Persistent cough--mostly dry Some sore throat and rhinorrhea Voice is off No headache or ear pain Periorbital swelling---no prior allergy problems No fever, chills or sweats No SOB  No improvement   Benzonatate and OTC cough med--nothing helping  Current Outpatient Medications on File Prior to Visit  Medication Sig Dispense Refill   amLODipine (NORVASC) 5 MG tablet TAKE 1 TABLET (5 MG TOTAL) BY MOUTH DAILY. 90 tablet 3   Blood Glucose Monitoring Suppl ([place]) w/Device KIT Use as directed to check blood sugar once a day 1 kit 0   fluticasone (FLONASE) 50 MCG/ACT nasal spray Place 2 sprays into both nostrils daily. 16 g 0   glucose blood (ONETOUCH VERIO) test strip Use to check blood sugar once daily. 100 each 3   metFORMIN (GLUCOPHAGE) 500 MG tablet Take 1 tablet (500 mg total) by mouth 2 (two) times daily with a meal. 180 tablet 3   OneTouch Delica Lancets 99991111 MISC 1 each by Does not apply route daily. Use to obtain blood sugar sample once daily. E11.59 100 each 3   rosuvastatin (CRESTOR) 10 MG tablet TAKE 1 TABLET BY MOUTH EVERY DAY 90 tablet 3   Specialty Vitamins Products (ONE-A-DAY BONE STRENGTH PO) Take 1 tablet by mouth daily.      No current facility-administered medications on file prior to visit.    Allergies  Allergen Reactions   Aspirin     REACTION: Swelling   Lisinopril Cough   Statins     Muscle cramps; pravastatin ok    Past Medical History:  Diagnosis Date   Allergic rhinitis    Allergy    Arthritis    Cataract    DM (diabetes mellitus)    GERD (gastroesophageal reflux disease)    Glaucoma    ? of, but seeing optho and told recently not glaucoma   High cholesterol    HTN (hypertension)    Internal hemorrhoids    Osteoporosis    in  2010, has refused bisphosphnate treatment or treament other then vit d and calcium   Panic anxiety syndrome 01/03/2013   Pure hyperglyceridemia     Past Surgical History:  Procedure Laterality Date   CESAREAN SECTION     COLONOSCOPY  05/03/2006   internal hemorrhoids   COLONOSCOPY     EYE SURGERY     OOPHORECTOMY Right 1999    Family History  Problem Relation Age of Onset   Coronary artery disease Maternal Uncle    Lung cancer Sister    Cancer Sister        lung    Pancreatic cancer Sister    Breast cancer Maternal Aunt    Colon cancer Mother 60       deceased age 35   Hypertension Mother    Diabetes Father    Hypertension Father    Diabetes Paternal Aunt    Esophageal cancer Neg Hx    Liver cancer Neg Hx    Rectal cancer Neg Hx    Stomach cancer Neg Hx    Colon polyps Neg Hx     Social History   Socioeconomic History   Marital status: Married    Spouse name: Not on file   Number of children: 4   Years of  education: Not on file   Highest education level: Professional school degree (e.g., MD, DDS, DVM, JD)  Occupational History   Occupation: Teacher-- elementary    Comment: Retired  Tobacco Use   Smoking status: Never    Passive exposure: Past   Smokeless tobacco: Never  Vaping Use   Vaping Use: Never used  Substance and Sexual Activity   Alcohol use: Yes    Comment: occ - very rare   Drug use: No   Sexual activity: Not on file  Other Topics Concern   Not on file  Social History Narrative   Work or School: 2nd grade at [employer] Situation: lives with husband and son - has 4 children, son in Facilities manager school, son animation, daughter is Pension scheme manager      Spiritual Beliefs: Baha'i faith - all religions are one, prayer based, no health care restrictions      No living will   Husband, then children, as health care POA   Would accept resuscitation   Would accept tube feeding at least short term--no sure about longer      Social Determinants of  Health   Financial Resource Strain: Low Risk  (04/08/2022)   Overall Financial Resource Strain (CARDIA)    Difficulty of Paying Living Expenses: Not hard at all  Food Insecurity: No Food Insecurity (04/08/2022)   Hunger Vital Sign    Worried About Running Out of Food in the Last Year: Never true    [place] in the Last Year: Never true  Transportation Needs: No Transportation Needs (04/08/2022)   PRAPARE - Hydrologist (Medical): No    Lack of Transportation (Non-Medical): No  Physical Activity: Sufficiently Active (04/08/2022)   Exercise Vital Sign    Days of Exercise per Week: 3 days    Minutes of Exercise per Session: 60 min  Stress: No Stress Concern Present (04/08/2022)   [place]    Feeling of Stress : Only a little  Social Connections: Moderately Integrated (04/08/2022)   Social Connection and Isolation Panel [NHANES]    Frequency of Communication with Friends and Family: More than three times a week    Frequency of Social Gatherings with Friends and Family: More than three times a week    Attends Religious Services: More than 4 times per year    Active Member of [employer] or Organizations: No    Attends Music therapist: Not on file    Marital Status: Married  Human resources officer Violence: Not on file   Review of Systems Husband has seen apnea at night---as long as 20-25 seconds He has to stimulate her     Objective:   Physical Exam Constitutional:      Appearance: Normal appearance.     Comments: hoarseness  HENT:     Head:     Comments: No sinus tenderness    Right Ear: Tympanic membrane and ear canal normal.     Left Ear: Tympanic membrane and ear canal normal.     Mouth/Throat:     Pharynx: No oropharyngeal exudate or posterior oropharyngeal erythema.  Eyes:     Comments: Mild pale periorbital swelling  Pulmonary:     Effort: Pulmonary effort is normal.      Breath sounds: Normal breath sounds. No wheezing or rales.  Musculoskeletal:     Cervical back: Neck supple.  Lymphadenopathy:     Cervical: No cervical  adenopathy.  Neurological:     Mental Status: She is alert.            Assessment & Plan:

## 2022-04-09 NOTE — Assessment & Plan Note (Signed)
Persistent symptom after a week Still seems viral--but might be settling in her sinuses Sleeping okay---so will avoid hycodan Advised loratadine/cetirizine for the eye swelling Rx amoxil to take if not improving in the next 1-2 days

## 2022-04-09 NOTE — Assessment & Plan Note (Signed)
Husband has witnessed long periods of apnea Will set up for evaluation

## 2022-04-30 ENCOUNTER — Encounter: Payer: Self-pay | Admitting: Internal Medicine

## 2022-04-30 ENCOUNTER — Telehealth (INDEPENDENT_AMBULATORY_CARE_PROVIDER_SITE_OTHER): Payer: Medicare HMO | Admitting: Internal Medicine

## 2022-04-30 DIAGNOSIS — U071 COVID-19: Secondary | ICD-10-CM | POA: Insufficient documentation

## 2022-04-30 MED ORDER — NIRMATRELVIR/RITONAVIR (PAXLOVID)TABLET: 3.0000 | ORAL_TABLET | Freq: Two times a day (BID) | ORAL | 0 refills | Status: AC

## 2022-04-30 NOTE — Assessment & Plan Note (Signed)
Mild illness but feels bad and would like treatment Discussed tylenol and OTC cough meds Will give paxlovid---hold crestor for duration and 2 days after, cut amlodipine in half Discussed ER if any SOB

## 2022-04-30 NOTE — Progress Notes (Signed)
Subjective:    Patient ID: Victoria Neal, female    DOB: 12/06/1955, 67 y.o.   MRN: [id number]  HPI Video virtual visit due to COVID infection Identification done Discussed limitations and billing and she gave consent Participants---patient in her home and I am in my office  Hadn't completely recovered from infection a few weeks ago Did try the antibiotic but not clear  that it helped  Started with symptoms 2 nights ago Fever, chills, runny nose 2 days ago Fever is better now No myalgias Some cough---makes her feel bad No sore throat No SOB No ear pain or headache  Current Outpatient Medications on File Prior to Visit  Medication Sig Dispense Refill   amLODipine (NORVASC) 5 MG tablet TAKE 1 TABLET (5 MG TOTAL) BY MOUTH DAILY. 90 tablet 3   Blood Glucose Monitoring Suppl (ONETOUCH VERIO FLEX SYSTEM) w/Device KIT Use as directed to check blood sugar once a day 1 kit 0   fluticasone (FLONASE) 50 MCG/ACT nasal spray Place 2 sprays into both nostrils daily. 16 g 0   glucose blood (ONETOUCH VERIO) test strip Use to check blood sugar once daily. 100 each 3   metFORMIN (GLUCOPHAGE) 500 MG tablet Take 1 tablet (500 mg total) by mouth 2 (two) times daily with a meal. 180 tablet 3   OneTouch Delica Lancets 33G MISC 1 each by Does not apply route daily. Use to obtain blood sugar sample once daily. E11.59 100 each 3   rosuvastatin (CRESTOR) 10 MG tablet TAKE 1 TABLET BY MOUTH EVERY DAY 90 tablet 3   Specialty Vitamins Products (ONE-A-DAY BONE STRENGTH PO) Take 1 tablet by mouth daily.      No current facility-administered medications on file prior to visit.    Allergies  Allergen Reactions   Aspirin     REACTION: Swelling   Lisinopril Cough   Statins     Muscle cramps; pravastatin ok    Past Medical History:  Diagnosis Date   Allergic rhinitis    Allergy    Arthritis    Cataract    DM (diabetes mellitus)    GERD (gastroesophageal reflux disease)    Glaucoma    ? of, but  seeing optho and told recently not glaucoma   High cholesterol    HTN (hypertension)    Internal hemorrhoids    Osteoporosis    in 2010, has refused bisphosphnate treatment or treament other then vit d and calcium   Panic anxiety syndrome 01/03/2013   Pure hyperglyceridemia     Past Surgical History:  Procedure Laterality Date   CESAREAN SECTION     COLONOSCOPY  05/03/2006   internal hemorrhoids   COLONOSCOPY     EYE SURGERY     OOPHORECTOMY Right 1999    Family History  Problem Relation Age of Onset   Coronary artery disease Maternal Uncle    Lung cancer Sister    Cancer Sister        lung    Pancreatic cancer Sister    Breast cancer Maternal Aunt    Colon cancer Mother 50       deceased age 72   Hypertension Mother    Diabetes Father    Hypertension Father    Diabetes Paternal Aunt    Esophageal cancer Neg Hx    Liver cancer Neg Hx    Rectal cancer Neg Hx    Stomach cancer Neg Hx    Colon polyps Neg Hx     Social History  Socioeconomic History   Marital status: Married    Spouse name: Not on file   Number of children: 4   Years of education: Not on file   Highest education level: Professional school degree (e.g., MD, DDS, DVM, JD)  Occupational History   Occupation: Runner, broadcasting/film/video-- elementary    Comment: Retired  Tobacco Use   Smoking status: Never    Passive exposure: Past   Smokeless tobacco: Never  Vaping Use   Vaping Use: Never used  Substance and Sexual Activity   Alcohol use: Yes    Comment: occ - very rare   Drug use: No   Sexual activity: Not on file  Other Topics Concern   Not on file  Social History Narrative   Work or School: 2nd grade at [employer] Situation: lives with husband and son - has 4 children, son in [employer] school, son animation, daughter is Radio producer      Spiritual Beliefs: Baha'i faith - all religions are one, prayer based, no health care restrictions      No living will   Husband, then children, as health care POA    Would accept resuscitation   Would accept tube feeding at least short term--no sure about longer      Social Determinants of Health   Financial Resource Strain: Low Risk  (04/08/2022)   Overall Financial Resource Strain (CARDIA)    Difficulty of Paying Living Expenses: Not hard at all  Food Insecurity: No Food Insecurity (04/08/2022)   Hunger Vital Sign    Worried About Running Out of Food in the Last Year: Never true    Ran Out of Food in the Last Year: Never true  Transportation Needs: No Transportation Needs (04/08/2022)   PRAPARE - Administrator, Civil Service (Medical): No    Lack of Transportation (Non-Medical): No  Physical Activity: Sufficiently Active (04/08/2022)   Exercise Vital Sign    Days of Exercise per Week: 3 days    Minutes of Exercise per Session: 60 min  Stress: No Stress Concern Present (04/08/2022)   [employer] of Occupational Health - Occupational Stress Questionnaire    Feeling of Stress : Only a little  Social Connections: Moderately Integrated (04/08/2022)   Social Connection and Isolation Panel [NHANES]    Frequency of Communication with Friends and Family: More than three times a week    Frequency of Social Gatherings with Friends and Family: More than three times a week    Attends Religious Services: More than 4 times per year    Active Member of [employer] or Organizations: No    Attends Engineer, structural: Not on file    Marital Status: Married  Catering manager Violence: Not on file   Review of Systems No N/V No loss of smell or taste Able to eat and drink     Objective:   Physical Exam Constitutional:      Comments: In bed and looks under the weather--but no distress  Pulmonary:     Effort: Pulmonary effort is normal. No respiratory distress.  Neurological:     Mental Status: She is alert.            Assessment & Plan:

## 2022-05-17 ENCOUNTER — Other Ambulatory Visit: Payer: Self-pay | Admitting: Internal Medicine

## 2022-05-17 DIAGNOSIS — I152 Hypertension secondary to endocrine disorders: Secondary | ICD-10-CM

## 2022-05-27 NOTE — Progress Notes (Signed)
05/28/22- 67 yoF never smoker for sleep evaluation courtesy of Dr Alphonsus Sias with concern of OSA Medical problem list includes HTN, DM2, GERD, Hyperlipemia, Glaucoma,  She is from [place] Husband from [place]. Epworth score- 12 Body weight today-126 lbs -----No previous sleep study. Husband states patient stops breathing during sleep and also lightly snores. Pt states she wakes up coughing    Aware of snoring and husband witnesses apnea episodes.  He is on CPAP.  She falls asleep quickly and takes occasional naps.  No sleep medication and little caffeine.  No ENT surgery.  Describes significant GERD but denies heart or lung problems.  Prior to Admission medications   Medication Sig Start Date End Date Taking? Authorizing Provider  amLODipine (NORVASC) 5 MG tablet TAKE 1 TABLET (5 MG TOTAL) BY MOUTH DAILY. 03/16/22  Yes Karie Schwalbe, MD  Blood Glucose Monitoring Suppl (ONETOUCH VERIO FLEX SYSTEM) w/Device KIT Use as directed to check blood sugar once a day 12/14/17  Yes Kriste Basque R, DO  fluticasone (FLONASE) 50 MCG/ACT nasal spray Place 2 sprays into both nostrils daily. 04/07/22  Yes Waldon Merl, PA-C  glucose blood (ONETOUCH VERIO) test strip Use to check blood sugar once daily. 06/19/21  Yes Karie Schwalbe, MD  metFORMIN (GLUCOPHAGE) 500 MG tablet Take 1 tablet (500 mg total) by mouth 2 (two) times daily with a meal. 03/06/22  Yes Karie Schwalbe, MD  OneTouch Delica Lancets 33G MISC 1 each by Does not apply route daily. Use to obtain blood sugar sample once daily. E11.59 06/16/21  Yes Tillman Abide I, MD  rosuvastatin (CRESTOR) 10 MG tablet TAKE 1 TABLET BY MOUTH EVERY DAY 03/16/22  Yes Karie Schwalbe, MD  Specialty Vitamins Products (ONE-A-DAY BONE STRENGTH PO) Take 1 tablet by mouth daily.    Yes [provider]   Past Medical History:  Diagnosis Date   Allergic rhinitis    Allergy    Arthritis    Cataract    DM (diabetes mellitus) (HCC)    GERD (gastroesophageal  reflux disease)    Glaucoma    ? of, but seeing optho and told recently not glaucoma   High cholesterol    HTN (hypertension)    Internal hemorrhoids    Osteoporosis    in 2010, has refused bisphosphnate treatment or treament other then vit d and calcium   Panic anxiety syndrome 01/03/2013   Pure hyperglyceridemia    Past Surgical History:  Procedure Laterality Date   CESAREAN SECTION     COLONOSCOPY  05/03/2006   internal hemorrhoids   COLONOSCOPY     EYE SURGERY     OOPHORECTOMY Right 1999   Family History  Problem Relation Age of Onset   Coronary artery disease Maternal Uncle    Lung cancer Sister    Cancer Sister        lung    Pancreatic cancer Sister    Breast cancer Maternal Aunt    Colon cancer Mother 94       deceased age 43   Hypertension Mother    Diabetes Father    Hypertension Father    Diabetes Paternal Aunt    Esophageal cancer Neg Hx    Liver cancer Neg Hx    Rectal cancer Neg Hx    Stomach cancer Neg Hx    Colon polyps Neg Hx    Social History   Socioeconomic History   Marital status: Married    Spouse name: Not on file  Number of children: 4   Years of education: Not on file   Highest education level: Professional school degree (e.g., MD, DDS, DVM, JD)  Occupational History   Occupation: Teacher-- elementary    Comment: Retired  Tobacco Use   Smoking status: Never    Passive exposure: Past   Smokeless tobacco: Never  Vaping Use   Vaping Use: Never used  Substance and Sexual Activity   Alcohol use: Yes    Comment: occ - very rare   Drug use: No   Sexual activity: Not on file  Other Topics Concern   Not on file  Social History Narrative   Work or School: 2nd grade at [employer] Situation: lives with husband and son - has 4 children, son in [employer] school, son animation, daughter is Radio producer      Spiritual Beliefs: Baha'i faith - all religions are one, prayer based, no health care restrictions      No living will    Husband, then children, as health care POA   Would accept resuscitation   Would accept tube feeding at least short term--no sure about longer      Social Determinants of Health   Financial Resource Strain: Low Risk  (04/08/2022)   Overall Financial Resource Strain (CARDIA)    Difficulty of Paying Living Expenses: Not hard at all  Food Insecurity: No Food Insecurity (04/08/2022)   Hunger Vital Sign    Worried About Running Out of Food in the Last Year: Never true    Ran Out of Food in the Last Year: Never true  Transportation Needs: No Transportation Needs (04/08/2022)   PRAPARE - Administrator, Civil Service (Medical): No    Lack of Transportation (Non-Medical): No  Physical Activity: Sufficiently Active (04/08/2022)   Exercise Vital Sign    Days of Exercise per Week: 3 days    Minutes of Exercise per Session: 60 min  Stress: No Stress Concern Present (04/08/2022)   [employer] of Occupational Health - Occupational Stress Questionnaire    Feeling of Stress : Only a little  Social Connections: Moderately Integrated (04/08/2022)   Social Connection and Isolation Panel [NHANES]    Frequency of Communication with Friends and Family: More than three times a week    Frequency of Social Gatherings with Friends and Family: More than three times a week    Attends Religious Services: More than 4 times per year    Active Member of [employer] or Organizations: No    Attends Engineer, structural: Not on file    Marital Status: Married  Catering manager Violence: Not on file   ROS-see HPI   + = positive Constitutional:    weight loss, night sweats, fevers, chills, fatigue, lassitude. HEENT:    headaches, difficulty swallowing, tooth/dental problems, sore throat,       sneezing, itching, ear ache, nasal congestion, post nasal drip, snoring CV:    chest pain, orthopnea, PND, swelling in lower extremities, anasarca,                                   dizziness, palpitations Resp:    shortness of breath with exertion or at rest.                productive cough,   non-productive cough, coughing up of blood.  change in color of mucus.  wheezing.   Skin:    rash or lesions. GI:  No-   heartburn, indigestion, abdominal pain, nausea, vomiting, diarrhea,                 change in bowel habits, loss of appetite GU: dysuria, change in color of urine, no urgency or frequency.   flank pain. MS:   joint pain, stiffness, decreased range of motion, back pain. Neuro-     nothing unusual Psych:  change in mood or affect.  depression or anxiety.   memory loss.  OBJ- Physical Exam General- Alert, Oriented, Affect-appropriate, Distress- none acute, + petite Skin- rash-none, lesions- none, excoriation- none Lymphadenopathy- none Head- atraumatic            Eyes- Gross vision intact, PERRLA, conjunctivae and secretions clear            Ears- Hearing, canals-normal            Nose- Clear, no-Septal dev, mucus, polyps, erosion, perforation             Throat- Mallampati III-IV , mucosa clear , drainage- none, tonsils- atrophic, +teeth Neck- flexible , trachea midline, no stridor , thyroid nl, carotid no bruit Chest - symmetrical excursion , unlabored           Heart/CV- RRR , no murmur , no gallop  , no rub, nl s1 s2                           - JVD- none , edema- none, stasis changes- none, varices- none           Lung- clear to P&A, wheeze- none, cough- none , dullness-none, rub- none           Chest wall-  Abd-  Br/ Gen/ Rectal- Not done, not indicated Extrem- cyanosis- none, clubbing, none, atrophy- none, strength- nl Neuro- grossly intact to observation

## 2022-05-28 ENCOUNTER — Ambulatory Visit (INDEPENDENT_AMBULATORY_CARE_PROVIDER_SITE_OTHER): Payer: Medicare HMO | Admitting: Internal Medicine

## 2022-05-28 ENCOUNTER — Encounter: Payer: Self-pay | Admitting: Internal Medicine

## 2022-05-28 ENCOUNTER — Other Ambulatory Visit: Payer: Self-pay | Admitting: Internal Medicine

## 2022-05-28 VITALS — BP 120/70 | HR 62 | Ht 63.0 in | Wt 126.4 lb

## 2022-05-28 DIAGNOSIS — E1159 Type 2 diabetes mellitus with other circulatory complications: Secondary | ICD-10-CM

## 2022-05-28 DIAGNOSIS — R0681 Apnea, not elsewhere classified: Secondary | ICD-10-CM

## 2022-05-28 DIAGNOSIS — G4733 Obstructive sleep apnea (adult) (pediatric): Secondary | ICD-10-CM | POA: Diagnosis not present

## 2022-05-28 DIAGNOSIS — K219 Gastro-esophageal reflux disease without esophagitis: Secondary | ICD-10-CM

## 2022-05-28 NOTE — Patient Instructions (Signed)
Order- schedule home sleep test     dx witnessed apnea  Please call us about 2 weeks after your sleep test for results and recommendations 

## 2022-05-29 ENCOUNTER — Telehealth: Payer: Self-pay | Admitting: Internal Medicine

## 2022-05-29 MED ORDER — LOSARTAN POTASSIUM 25 MG PO TABS: 25.0000 mg | ORAL_TABLET | Freq: Every day | ORAL | 3 refills | Status: DC

## 2022-05-29 NOTE — Assessment & Plan Note (Signed)
Probable OSA.  I discussed treatment and management options.  Husband uses CPAP but is unhappy with his mask.  His provider is in [place]. Plan-Home sleep test.  If intervention is appropriate, CPAP or an oral appliance might be good choices.

## 2022-05-29 NOTE — Telephone Encounter (Signed)
Patient would like to know if she is supposed to still be taking losartan? Medication is no longer on her med list and her request has been refused 2 times by Dr Alphonsus Sias

## 2022-05-29 NOTE — Telephone Encounter (Signed)
Looks like it was accidentally removed 03-06-22. There is no note stating it should have been stopped. I have sent it in for her.

## 2022-05-29 NOTE — Assessment & Plan Note (Signed)
Cared for elsewhere but she is definitely aware of it and recognizes some association with cough.

## 2022-06-17 ENCOUNTER — Telehealth: Payer: Medicare HMO | Admitting: Internal Medicine

## 2022-07-01 ENCOUNTER — Ambulatory Visit (INDEPENDENT_AMBULATORY_CARE_PROVIDER_SITE_OTHER): Payer: Medicare HMO | Admitting: Internal Medicine

## 2022-07-01 ENCOUNTER — Encounter: Payer: Self-pay | Admitting: Internal Medicine

## 2022-07-01 VITALS — BP 116/76 | HR 56 | Temp 97.1°F | Ht 63.0 in | Wt 125.0 lb

## 2022-07-01 DIAGNOSIS — M25562 Pain in left knee: Secondary | ICD-10-CM

## 2022-07-01 NOTE — Assessment & Plan Note (Signed)
No clear injury Doesn't seem to have tear in ligament or meniscus No long standing arthritis Will have her try diclofenac gel Set up with Dr Patsy Lager

## 2022-07-01 NOTE — Patient Instructions (Signed)
Please try over the counter diclofenac gel three times a day for knee (and back if needed)

## 2022-07-01 NOTE — Progress Notes (Signed)
Subjective:    Patient ID: Victoria Neal, female    DOB: Sep 17, 1955, 67 y.o.   MRN: [id number]  HPI Here due to left leg pain--for 2-3 weeks  Mostly around the knee Hard to bend---but then has trouble straightening it after it is bent Seems to be affecting her back also Hard to stand up for extended time Burning sensation in the knee  No swelling [place]--but the pain had started before this (and just hit bottom without apparent injury)  Current Outpatient Medications on File Prior to Visit  Medication Sig Dispense Refill   amLODipine (NORVASC) 5 MG tablet TAKE 1 TABLET (5 MG TOTAL) BY MOUTH DAILY. 90 tablet 3   Blood Glucose Monitoring Suppl (ONETOUCH VERIO FLEX SYSTEM) w/Device KIT Use as directed to check blood sugar once a day 1 kit 0   glucose blood (ONETOUCH VERIO) test strip Use to check blood sugar once daily. 100 each 3   losartan (COZAAR) 25 MG tablet Take 1 tablet (25 mg total) by mouth daily. 90 tablet 3   metFORMIN (GLUCOPHAGE) 500 MG tablet Take 1 tablet (500 mg total) by mouth 2 (two) times daily with a meal. 180 tablet 3   OneTouch Delica Lancets 33G MISC 1 each by Does not apply route daily. Use to obtain blood sugar sample once daily. E11.59 100 each 3   rosuvastatin (CRESTOR) 10 MG tablet TAKE 1 TABLET BY MOUTH EVERY DAY 90 tablet 3   Specialty Vitamins Products (ONE-A-DAY BONE STRENGTH PO) Take 1 tablet by mouth daily.      No current facility-administered medications on file prior to visit.    Allergies  Allergen Reactions   Aspirin     REACTION: Swelling   Lisinopril Cough   Statins     Muscle cramps; pravastatin ok    Past Medical History:  Diagnosis Date   Allergic rhinitis    Allergy    Arthritis    Cataract    DM (diabetes mellitus) (HCC)    GERD (gastroesophageal reflux disease)    Glaucoma    ? of, but seeing optho and told recently not glaucoma   High cholesterol    HTN (hypertension)     Internal hemorrhoids    Osteoporosis    in 2010, has refused bisphosphnate treatment or treament other then vit d and calcium   Panic anxiety syndrome 01/03/2013   Pure hyperglyceridemia     Past Surgical History:  Procedure Laterality Date   CESAREAN SECTION     COLONOSCOPY  05/03/2006   internal hemorrhoids   COLONOSCOPY     EYE SURGERY     OOPHORECTOMY Right 1999    Family History  Problem Relation Age of Onset   Coronary artery disease Maternal Uncle    Lung cancer Sister    Cancer Sister        lung    Pancreatic cancer Sister    Breast cancer Maternal Aunt    Colon cancer Mother 14       deceased age 68   Hypertension Mother    Diabetes Father    Hypertension Father    Diabetes Paternal Aunt    Esophageal cancer Neg Hx    Liver cancer Neg Hx    Rectal cancer Neg Hx    Stomach cancer Neg Hx    Colon polyps Neg Hx     Social History   Socioeconomic History   Marital status: Married  Spouse name: Not on file   Number of children: 4   Years of education: Not on file   Highest education level: Professional school degree (e.g., MD, DDS, DVM, JD)  Occupational History   Occupation: Runner, broadcasting/film/video-- elementary    Comment: Retired  Tobacco Use   Smoking status: Never    Passive exposure: Past   Smokeless tobacco: Never  Vaping Use   Vaping Use: Never used  Substance and Sexual Activity   Alcohol use: Yes    Comment: occ - very rare   Drug use: No   Sexual activity: Not on file  Other Topics Concern   Not on file  Social History Narrative   Work or School: 2nd grade at [employer] Situation: lives with husband and son - has 4 children, son in [employer] school, son animation, daughter is Radio producer      Spiritual Beliefs: Baha'i faith - all religions are one, prayer based, no health care restrictions      No living will   Husband, then children, as health care POA   Would accept resuscitation   Would accept tube feeding at least short term--no sure  about longer      Social Determinants of Health   Financial Resource Strain: Low Risk  (04/08/2022)   Overall Financial Resource Strain (CARDIA)    Difficulty of Paying Living Expenses: Not hard at all  Food Insecurity: No Food Insecurity (04/08/2022)   Hunger Vital Sign    Worried About Running Out of Food in the Last Year: Never true    Ran Out of Food in the Last Year: Never true  Transportation Needs: No Transportation Needs (04/08/2022)   PRAPARE - Administrator, Civil Service (Medical): No    Lack of Transportation (Non-Medical): No  Physical Activity: Sufficiently Active (04/08/2022)   Exercise Vital Sign    Days of Exercise per Week: 3 days    Minutes of Exercise per Session: 60 min  Stress: No Stress Concern Present (04/08/2022)   [employer] of Occupational Health - Occupational Stress Questionnaire    Feeling of Stress : Only a little  Social Connections: Moderately Integrated (04/08/2022)   Social Connection and Isolation Panel [NHANES]    Frequency of Communication with Friends and Family: More than three times a week    Frequency of Social Gatherings with Friends and Family: More than three times a week    Attends Religious Services: More than 4 times per year    Active Member of [employer] or Organizations: No    Attends Engineer, structural: Not on file    Marital Status: Married  Catering manager Violence: Not on file   Review of Systems Recently started drinking almond milk No fever No tick bites     Objective:   Physical Exam Musculoskeletal:     Comments: Mild tenderness left low lumbar back SLR negative Passive ROM okay in left hip Pain with manipulation of knee---but no ligament or meniscus findings No effusion ROM of knee causes low hamstring pain also--but no swelling  Neurological:     Comments: Antalgic gait--mild weakness in left hip flexors            Assessment & Plan:

## 2022-07-06 ENCOUNTER — Ambulatory Visit: Payer: Medicare HMO | Admitting: Family Medicine

## 2022-08-05 ENCOUNTER — Encounter (INDEPENDENT_AMBULATORY_CARE_PROVIDER_SITE_OTHER): Payer: Self-pay

## 2022-08-28 ENCOUNTER — Ambulatory Visit: Payer: Medicare HMO | Admitting: Internal Medicine

## 2022-09-03 ENCOUNTER — Ambulatory Visit (INDEPENDENT_AMBULATORY_CARE_PROVIDER_SITE_OTHER): Payer: Medicare HMO | Admitting: Internal Medicine

## 2022-09-03 ENCOUNTER — Encounter: Payer: Self-pay | Admitting: Internal Medicine

## 2022-09-03 VITALS — BP 126/70 | HR 60 | Temp 97.3°F | Ht 63.0 in | Wt 126.0 lb

## 2022-09-03 DIAGNOSIS — I10 Essential (primary) hypertension: Secondary | ICD-10-CM | POA: Diagnosis not present

## 2022-09-03 DIAGNOSIS — Z7984 Long term (current) use of oral hypoglycemic drugs: Secondary | ICD-10-CM

## 2022-09-03 DIAGNOSIS — E1159 Type 2 diabetes mellitus with other circulatory complications: Secondary | ICD-10-CM | POA: Diagnosis not present

## 2022-09-03 DIAGNOSIS — R058 Other specified cough: Secondary | ICD-10-CM | POA: Diagnosis not present

## 2022-09-03 LAB — POCT GLYCOSYLATED HEMOGLOBIN (HGB A1C): Hemoglobin A1C: 6.9 % — AB (ref 4.0–5.6)

## 2022-09-03 MED ORDER — ONETOUCH VERIO VI STRP: ORAL_STRIP | 3 refills | Status: AC

## 2022-09-03 MED ORDER — ONETOUCH DELICA LANCETS 33G MISC: 1.0000 | Freq: Every day | 3 refills | Status: AC

## 2022-09-03 MED ORDER — METFORMIN HCL 500 MG PO TABS: 500.0000 mg | ORAL_TABLET | Freq: Every day | ORAL | 3 refills | Status: DC

## 2022-09-03 NOTE — Assessment & Plan Note (Signed)
Lab Results  Component Value Date   HGBA1C 6.9 (A) 09/03/2022   Much better with dietary changes and more activity Continue just the metformin 500mg  daily

## 2022-09-03 NOTE — Patient Instructions (Signed)
If you eat late, I recommend taking an over the counter famotidine 20mg  at bedtime. You should probably use the allegra nightly during allergy season

## 2022-09-03 NOTE — Progress Notes (Signed)
Subjective:    Patient ID: Victoria Neal, female    DOB: [birth year], 67 y.o.   MRN: [id number]  HPI Here for follow up of diabetes and other issues  Didn't tolerate increased metformin to 1000 daily Has been back to 500mg  daily Has cut out sweets and is exercising more Checks sugars weekly----highest has been 122  Having some cough at night--will wake her up Has to clear throat and drinks some water Especially if she eats late No heartburn No dysphagia Does have allergies--nasal drip and eye burning--uses allegra/claritin prn  No chest pain No SOB  Current Outpatient Medications on File Prior to Visit  Medication Sig Dispense Refill   amLODipine (NORVASC) 5 MG tablet TAKE 1 TABLET (5 MG TOTAL) BY MOUTH DAILY. 90 tablet 3   Blood Glucose Monitoring Suppl (ONETOUCH VERIO FLEX SYSTEM) w/Device KIT Use as directed to check blood sugar once a day 1 kit 0   glucose blood (ONETOUCH VERIO) test strip Use to check blood sugar once daily. 100 each 3   losartan (COZAAR) 25 MG tablet Take 1 tablet (25 mg total) by mouth daily. 90 tablet 3   metFORMIN (GLUCOPHAGE) 500 MG tablet Take 1 tablet (500 mg total) by mouth 2 (two) times daily with a meal. 180 tablet 3   OneTouch Delica Lancets 33G MISC 1 each by Does not apply route daily. Use to obtain blood sugar sample once daily. E11.59 100 each 3   rosuvastatin (CRESTOR) 10 MG tablet TAKE 1 TABLET BY MOUTH EVERY DAY 90 tablet 3   Specialty Vitamins Products (ONE-A-DAY BONE STRENGTH PO) Take 1 tablet by mouth daily.      No current facility-administered medications on file prior to visit.    Allergies  Allergen Reactions   Aspirin     REACTION: Swelling   Lisinopril Cough   Statins     Muscle cramps; pravastatin ok    Past Medical History:  Diagnosis Date   Allergic rhinitis    Allergy    Arthritis    Cataract    DM (diabetes mellitus) (HCC)    GERD (gastroesophageal reflux disease)    Glaucoma    ? of, but seeing optho and  told recently not glaucoma   High cholesterol    HTN (hypertension)    Internal hemorrhoids    Osteoporosis    in 2010, has refused bisphosphnate treatment or treament other then vit d and calcium   Panic anxiety syndrome 01/03/2013   Pure hyperglyceridemia     Past Surgical History:  Procedure Laterality Date   CESAREAN SECTION     COLONOSCOPY  05/03/2006   internal hemorrhoids   COLONOSCOPY     EYE SURGERY     OOPHORECTOMY Right 1999    Family History  Problem Relation Age of Onset   Coronary artery disease Maternal Uncle    Lung cancer Sister    Cancer Sister        lung    Pancreatic cancer Sister    Breast cancer Maternal Aunt    Colon cancer Mother 63       deceased age 72   Hypertension Mother    Diabetes Father    Hypertension Father    Diabetes Paternal Aunt    Esophageal cancer Neg Hx    Liver cancer Neg Hx    Rectal cancer Neg Hx    Stomach cancer Neg Hx    Colon polyps Neg Hx     Social History   Socioeconomic  History   Marital status: Married    Spouse name: Not on file   Number of children: 4   Years of education: Not on file   Highest education level: Professional school degree (e.g., MD, DDS, DVM, JD)  Occupational History   Occupation: Runner, broadcasting/film/video-- elementary    Comment: Retired  Tobacco Use   Smoking status: Never    Passive exposure: Past   Smokeless tobacco: Never  Vaping Use   Vaping status: Never Used  Substance and Sexual Activity   Alcohol use: Yes    Comment: occ - very rare   Drug use: No   Sexual activity: Not on file  Other Topics Concern   Not on file  Social History Narrative   Work or School: 2nd grade at [employer] Situation: lives with husband and son - has 4 children, son in Dealer school, son animation, daughter is Radio producer      Spiritual Beliefs: Baha'i faith - all religions are one, prayer based, no health care restrictions      No living will   Husband, then children, as health care POA   Would  accept resuscitation   Would accept tube feeding at least short term--no sure about longer      Social Determinants of Health   Financial Resource Strain: Low Risk  (04/08/2022)   Overall Financial Resource Strain (CARDIA)    Difficulty of Paying Living Expenses: Not hard at all  Food Insecurity: No Food Insecurity (04/08/2022)   Hunger Vital Sign    Worried About Running Out of Food in the Last Year: Never true    Ran Out of Food in the Last Year: Never true  Transportation Needs: No Transportation Needs (04/08/2022)   PRAPARE - Administrator, Civil Service (Medical): No    Lack of Transportation (Non-Medical): No  Physical Activity: Sufficiently Active (04/08/2022)   Exercise Vital Sign    Days of Exercise per Week: 3 days    Minutes of Exercise per Session: 60 min  Stress: No Stress Concern Present (04/08/2022)   [employer] of Occupational Health - Occupational Stress Questionnaire    Feeling of Stress : Only a little  Social Connections: Moderately Integrated (04/08/2022)   Social Connection and Isolation Panel [NHANES]    Frequency of Communication with Friends and Family: More than three times a week    Frequency of Social Gatherings with Friends and Family: More than three times a week    Attends Religious Services: More than 4 times per year    Active Member of [employer] or Organizations: No    Attends Engineer, structural: Not on file    Marital Status: Married  Catering manager Violence: Not on file   Review of Systems Did have sleep evaluation---but no sleep study yet Wakes up feeling good---no daytime somnolence    Objective:   Physical Exam Constitutional:      Appearance: Normal appearance.  HENT:     Mouth/Throat:     Pharynx: No oropharyngeal exudate or posterior oropharyngeal erythema.  Cardiovascular:     Rate and Rhythm: Normal rate and regular rhythm.     Pulses: Normal pulses.     Heart sounds: No murmur heard.    No gallop.   Pulmonary:     Effort: Pulmonary effort is normal.     Breath sounds: Normal breath sounds. No wheezing or rales.  Musculoskeletal:     Cervical back: Neck supple.     Right  lower leg: No edema.     Left lower leg: No edema.  Lymphadenopathy:     Cervical: No cervical adenopathy.  Skin:    Comments: No foot lesions  Neurological:     Mental Status: She is alert.            Assessment & Plan:

## 2022-09-03 NOTE — Assessment & Plan Note (Signed)
BP Readings from Last 3 Encounters:  09/03/22 126/70  07/01/22 116/76  05/28/22 120/70   Good control on losartan 25mg  daily and amlodipine 5 daily

## 2022-09-03 NOTE — Assessment & Plan Note (Signed)
Could be related to allergies or reflux Discussed famotidine at bedtime if she eats late May want allegra at bedtime in allergy season

## 2022-10-14 ENCOUNTER — Telehealth: Payer: Self-pay | Admitting: Internal Medicine

## 2022-10-14 ENCOUNTER — Other Ambulatory Visit: Payer: Self-pay | Admitting: Internal Medicine

## 2022-10-14 DIAGNOSIS — Z1231 Encounter for screening mammogram for malignant neoplasm of breast: Secondary | ICD-10-CM

## 2022-10-14 NOTE — Telephone Encounter (Signed)
Patient would like to schedule sleep test. Patient phone number is [phone].

## 2022-10-15 NOTE — Telephone Encounter (Signed)
Order was placed in [facility] 05/28/22 patient delayed the test. LVM patient will need to call [facility] [phone]

## 2022-11-09 ENCOUNTER — Ambulatory Visit: Payer: Medicare HMO | Admitting: Internal Medicine

## 2022-12-21 ENCOUNTER — Ambulatory Visit
Admission: RE | Admit: 2022-12-21 | Discharge: 2022-12-21 | Disposition: A | Discharge status: Home / Self Care | Payer: Medicare HMO | Source: Ambulatory Visit | Attending: Internal Medicine | Admitting: Internal Medicine

## 2022-12-21 DIAGNOSIS — Z1231 Encounter for screening mammogram for malignant neoplasm of breast: Secondary | ICD-10-CM

## 2023-03-08 ENCOUNTER — Ambulatory Visit: Payer: Medicare HMO | Admitting: Internal Medicine

## 2023-03-08 ENCOUNTER — Encounter: Payer: Self-pay | Admitting: Internal Medicine

## 2023-03-08 VITALS — BP 112/72 | HR 66 | Temp 98.5°F | Ht 63.0 in | Wt 121.0 lb

## 2023-03-08 DIAGNOSIS — Z7984 Long term (current) use of oral hypoglycemic drugs: Secondary | ICD-10-CM

## 2023-03-08 DIAGNOSIS — E119 Type 2 diabetes mellitus without complications: Secondary | ICD-10-CM | POA: Insufficient documentation

## 2023-03-08 DIAGNOSIS — Z0000 Encounter for general adult medical examination without abnormal findings: Secondary | ICD-10-CM

## 2023-03-08 DIAGNOSIS — E1159 Type 2 diabetes mellitus with other circulatory complications: Secondary | ICD-10-CM | POA: Diagnosis not present

## 2023-03-08 DIAGNOSIS — K219 Gastro-esophageal reflux disease without esophagitis: Secondary | ICD-10-CM | POA: Diagnosis not present

## 2023-03-08 DIAGNOSIS — I10 Essential (primary) hypertension: Secondary | ICD-10-CM | POA: Diagnosis not present

## 2023-03-08 LAB — COMPREHENSIVE METABOLIC PANEL
ALT: 14 U/L (ref 0–35)
AST: 17 U/L (ref 0–37)
Albumin: 4.4 g/dL (ref 3.5–5.2)
Alkaline Phosphatase: 65 U/L (ref 39–117)
BUN: 15 mg/dL (ref 6–23)
CO2: 28 meq/L (ref 19–32)
Calcium: 9.7 mg/dL (ref 8.4–10.5)
Chloride: 105 meq/L (ref 96–112)
Creatinine, Ser: 0.72 mg/dL (ref 0.40–1.20)
GFR: 85.42 mL/min (ref >60.00)
Glucose, Bld: 112 mg/dL — ABNORMAL HIGH (ref 70–99)
Potassium: 4.2 meq/L (ref 3.5–5.1)
Sodium: 142 meq/L (ref 135–145)
Total Bilirubin: 0.6 mg/dL (ref 0.2–1.2)
Total Protein: 7.1 g/dL (ref 6.0–8.3)

## 2023-03-08 LAB — HEMOGLOBIN A1C: Hgb A1c MFr Bld: 7.2 % — ABNORMAL HIGH (ref 4.6–6.5)

## 2023-03-08 LAB — LIPID PANEL
Cholesterol: 171 mg/dL (ref 0–200)
HDL: 84.5 mg/dL (ref >39.00)
LDL Cholesterol: 61 mg/dL (ref 0–99)
NonHDL: 86.44
Total CHOL/HDL Ratio: 2
Triglycerides: 128 mg/dL (ref 0.0–149.0)
VLDL: 25.6 mg/dL (ref 0.0–40.0)

## 2023-03-08 LAB — CBC
HCT: 42.8 % (ref 36.0–46.0)
Hemoglobin: 14 g/dL (ref 12.0–15.0)
MCHC: 32.7 g/dL (ref 30.0–36.0)
MCV: 88.9 fl (ref 78.0–100.0)
Platelets: 278 10*3/uL (ref 150.0–400.0)
RBC: 4.82 Mil/uL (ref 3.87–5.11)
RDW: 13.8 % (ref 11.5–15.5)
WBC: 5.8 10*3/uL (ref 4.0–10.5)

## 2023-03-08 LAB — HM DIABETES FOOT EXAM

## 2023-03-08 LAB — VITAMIN B12: Vitamin B-12: 320 pg/mL (ref 211–911)

## 2023-03-08 LAB — TSH: TSH: 1.69 u[IU]/mL (ref 0.35–5.50)

## 2023-03-08 NOTE — Assessment & Plan Note (Signed)
 I have personally reviewed the Medicare Annual Wellness questionnaire and have noted 1. The patient's medical and social history 2. Their use of alcohol, tobacco or illicit drugs 3. Their current medications and supplements 4. The patient's functional ability including ADL's, fall risks, home safety risks and hearing or visual             impairment. 5. Diet and physical activities 6. Evidence for depression or mood disorders  The patients weight, height, BMI and visual acuity have been recorded in the chart I have made referrals, counseling and provided education to the patient based review of the above and I have provided the pt with a written personalized care plan for preventive services.  I have provided you with a copy of your personalized plan for preventive services. Please take the time to review along with your updated medication list.  Colon due 2028 Yearly mammogram--gets in December Exercises regularly Will need flu/COVID vaccines in the fall

## 2023-03-08 NOTE — Progress Notes (Signed)
 Subjective:    Patient ID: Victoria Neal, female    DOB: January 04, 1956, 68 y.o.   MRN: [id number]  HPI Here for Medicare wellness visit and follow up of chronic health conditions Reviewed advanced directives Reviewed other doctors---Dr Ranae Palms med, Dr Gus Puma, Dr Encarnacion Chu, Dr Laing--audiology, Dr Bess Kinds (son)--dentist, Dr Dewitt Rota, Dr Brennan Bailey, Dr Merry Lofty No hospitalizations or surgery in the past year No alcohol or tobacco Exercising regularly Vision is okay---may need cataracts done in the future Hearing is fine No falls No depression or anhedonia Independent with instrumental ADLs No memory problems  Checks sugars just once in a while 120-140 fasting No foot numbness or tingling  No chest pain or SOB Some night cough--has humidifier No dizziness or syncope No edema  Some night heartburn--if she eats late No dysphagia Hasn't taken any meds---drinks water or lime juice with symptoms  Has had some swelling in her hands Not really painful---just some stinging  Current Outpatient Medications on File Prior to Visit  Medication Sig Dispense Refill   amLODipine (NORVASC) 5 MG tablet TAKE 1 TABLET (5 MG TOTAL) BY MOUTH DAILY. 90 tablet 3   Blood Glucose Monitoring Suppl (ONETOUCH VERIO FLEX SYSTEM) w/Device KIT Use as directed to check blood sugar once a day 1 kit 0   glucose blood (ONETOUCH VERIO) test strip Use to check blood sugar once daily. 100 each 3   losartan (COZAAR) 25 MG tablet Take 1 tablet (25 mg total) by mouth daily. 90 tablet 3   metFORMIN (GLUCOPHAGE) 500 MG tablet Take 1 tablet (500 mg total) by mouth daily with breakfast. 1 tablet 3   OneTouch Delica Lancets 33G MISC 1 each by Does not apply route daily. Use to obtain blood sugar sample once daily. E11.59 100 each 3   rosuvastatin (CRESTOR) 10 MG tablet TAKE 1 TABLET BY MOUTH EVERY DAY 90 tablet 3   Specialty Vitamins Products (ONE-A-DAY BONE STRENGTH PO) Take 1 tablet  by mouth daily.      No current facility-administered medications on file prior to visit.    Allergies  Allergen Reactions   Aspirin     REACTION: Swelling   Lisinopril Cough   Statins     Muscle cramps; pravastatin ok    Past Medical History:  Diagnosis Date   Allergic rhinitis    Allergy    Arthritis    Cataract    DM (diabetes mellitus) (HCC)    GERD (gastroesophageal reflux disease)    Glaucoma    ? of, but seeing optho and told recently not glaucoma   High cholesterol    HTN (hypertension)    Internal hemorrhoids    Osteoporosis    in 2010, has refused bisphosphnate treatment or treament other then vit d and calcium   Panic anxiety syndrome 01/03/2013   Pure hyperglyceridemia     Past Surgical History:  Procedure Laterality Date   CESAREAN SECTION     COLONOSCOPY  05/03/2006   internal hemorrhoids   COLONOSCOPY     EYE SURGERY     OOPHORECTOMY Right 1999    Family History  Problem Relation Age of Onset   Colon cancer Mother 53       deceased age 51   Hypertension Mother    Diabetes Father    Hypertension Father    Lung cancer Sister    Cancer Sister        lung    Pancreatic cancer Sister    Stomach cancer Brother    Lung  cancer Brother    Breast cancer Maternal Aunt    Coronary artery disease Maternal Uncle    Diabetes Paternal Aunt    Esophageal cancer Neg Hx    Liver cancer Neg Hx    Rectal cancer Neg Hx    Colon polyps Neg Hx     Social History   Socioeconomic History   Marital status: Married    Spouse name: Not on file   Number of children: 4   Years of education: Not on file   Highest education level: Professional school degree (e.g., MD, DDS, DVM, JD)  Occupational History   Occupation: Runner, broadcasting/film/video-- elementary    Comment: Retired  Tobacco Use   Smoking status: Never    Passive exposure: Past   Smokeless tobacco: Never  Vaping Use   Vaping status: Never Used  Substance and Sexual Activity   Alcohol use: Yes    Comment: occ -  very rare   Drug use: No   Sexual activity: Not on file  Other Topics Concern   Not on file  Social History Narrative   Work or School: 2nd grade at [employer] Situation: lives with husband and son - has 4 children, son in [employer]er school, son animation, daughter is Radio producer      Spiritual Beliefs: Baha'i faith - all religions are one, prayer based, no health care restrictions      No living will   Husband, then children, as health care POA   Would accept resuscitation   Would accept tube feeding at least short term--no sure about longer      Social Drivers of Health   Financial Resource Strain: Low Risk  (04/08/2022)   Overall Financial Resource Strain (CARDIA)    Difficulty of Paying Living Expenses: Not hard at all  Food Insecurity: No Food Insecurity (04/08/2022)   Hunger Vital Sign    Worried About Running Out of Food in the Last Year: Never true    Ran Out of Food in the Last Year: Never true  Transportation Needs: No Transportation Needs (04/08/2022)   PRAPARE - Administrator, Civil Service (Medical): No    Lack of Transportation (Non-Medical): No  Physical Activity: Sufficiently Active (04/08/2022)   Exercise Vital Sign    Days of Exercise per Week: 3 days    Minutes of Exercise per Session: 60 min  Stress: No Stress Concern Present (04/08/2022)   [employer] of Occupational Health - Occupational Stress Questionnaire    Feeling of Stress : Only a little  Social Connections: Moderately Integrated (04/08/2022)   Social Connection and Isolation Panel [NHANES]    Frequency of Communication with Friends and Family: More than three times a week    Frequency of Social Gatherings with Friends and Family: More than three times a week    Attends Religious Services: More than 4 times per year    Active Member of [employer] or Organizations: No    Attends Engineer, structural: Not on file    Marital Status: Married  Catering manager Violence: Not on file    Review of Systems Appetite is good Weight is stable Sleeps well Wears seat belt Partial dentures--keeps up with dentist No suspicious skin lesions Still some leg cramps--especially if works all day Bowels move fine--no blood No sig back or joint pains    Objective:   Physical Exam Constitutional:      Appearance: Normal appearance.  HENT:     Mouth/Throat:  Pharynx: No oropharyngeal exudate or posterior oropharyngeal erythema.  Eyes:     Conjunctiva/sclera: Conjunctivae normal.     Pupils: Pupils are equal, round, and reactive to light.  Cardiovascular:     Rate and Rhythm: Normal rate and regular rhythm.     Pulses: Normal pulses.     Heart sounds: No murmur heard.    No gallop.  Pulmonary:     Effort: Pulmonary effort is normal.     Breath sounds: Normal breath sounds. No wheezing or rales.  Abdominal:     Palpations: Abdomen is soft.     Tenderness: There is no abdominal tenderness.  Musculoskeletal:     Cervical back: Neck supple.     Right lower leg: No edema.     Left lower leg: No edema.  Lymphadenopathy:     Cervical: No cervical adenopathy.  Skin:    Findings: No rash.     Comments: No foot lesions  Neurological:     General: No focal deficit present.     Mental Status: She is alert and oriented to person, place, and time.     Comments: Mini-cog---normal Normal sensation in feet  Psychiatric:        Mood and Affect: Mood normal.        Behavior: Behavior normal.            Assessment & Plan:

## 2023-03-08 NOTE — Assessment & Plan Note (Signed)
 Occ evening symptoms No regular meds

## 2023-03-08 NOTE — Assessment & Plan Note (Signed)
 BP Readings from Last 3 Encounters:  03/08/23 112/72  09/03/22 126/70  07/01/22 116/76   Controlled with the amlodipine 5mg  and losartan 25mg  daily

## 2023-03-08 NOTE — Assessment & Plan Note (Signed)
 Seems to have good control on the metformin 500mg  daily

## 2023-03-09 ENCOUNTER — Encounter: Payer: Self-pay | Admitting: Internal Medicine

## 2023-03-09 LAB — MICROALBUMIN / CREATININE URINE RATIO
Creatinine,U: 139.2 mg/dL
Microalb Creat Ratio: 5.2 mg/g (ref 0.0–30.0)
Microalb, Ur: 0.7 mg/dL (ref 0.0–1.9)

## 2023-03-21 ENCOUNTER — Other Ambulatory Visit: Payer: Self-pay | Admitting: Internal Medicine

## 2023-03-21 DIAGNOSIS — I152 Hypertension secondary to endocrine disorders: Secondary | ICD-10-CM

## 2023-04-19 ENCOUNTER — Encounter: Payer: Self-pay | Admitting: Internal Medicine

## 2023-04-19 ENCOUNTER — Ambulatory Visit (INDEPENDENT_AMBULATORY_CARE_PROVIDER_SITE_OTHER): Admitting: Internal Medicine

## 2023-04-19 VITALS — BP 110/70 | HR 74 | Temp 98.6°F | Wt 124.0 lb

## 2023-04-19 DIAGNOSIS — M25561 Pain in right knee: Secondary | ICD-10-CM | POA: Insufficient documentation

## 2023-04-19 NOTE — Assessment & Plan Note (Signed)
 Doesn't seem to have sig OA  May have strain of MCL and meniscus--but not clearly a tear Discussed trial with ibuprofen 400-600mg  up to three times a day Topical diclofenac and consider lidocaine Set up with Dr Geralyn Knee if not improving in 1-2 weeks

## 2023-04-19 NOTE — Patient Instructions (Signed)
 Please try ibuprofen 400-600mg  up to three times a day (especially at bedtime) You can use topical diclofenac (over the counter) 3-4 times a day also If it hurts, try some ice.  If you are not better in 1-2 weeks, set up an appointment with Dr Sloan Duncans sports medicine specialist

## 2023-04-19 NOTE — Progress Notes (Signed)
 Subjective:    Patient ID: Victoria Neal, female    DOB: [birth year], 68 y.o.   MRN: [id number]  HPI Here due to right knee pain With husband  Having pain in right knee In past 2 weeks it hurts and is bad at night--affecting her sleep After sitting for a while--stiff and trouble straightening it Then hard to bend it after standing Has kept up with work schedule, etc  No fall or injury No twisting, etc  Tried Vick's--slightly helpful Uses heat also  Current Outpatient Medications on File Prior to Visit  Medication Sig Dispense Refill   amLODipine (NORVASC) 5 MG tablet TAKE 1 TABLET (5 MG TOTAL) BY MOUTH DAILY. 90 tablet 3   Blood Glucose Monitoring Suppl (ONETOUCH VERIO FLEX SYSTEM) w/Device KIT Use as directed to check blood sugar once a day 1 kit 0   glucose blood (ONETOUCH VERIO) test strip Use to check blood sugar once daily. 100 each 3   losartan (COZAAR) 25 MG tablet TAKE 1 TABLET (25 MG TOTAL) BY MOUTH DAILY. 90 tablet 3   metFORMIN (GLUCOPHAGE) 500 MG tablet TAKE 1 TABLET BY MOUTH 2 TIMES DAILY WITH A MEAL. 180 tablet 3   OneTouch Delica Lancets 33G MISC 1 each by Does not apply route daily. Use to obtain blood sugar sample once daily. E11.59 100 each 3   rosuvastatin (CRESTOR) 10 MG tablet TAKE 1 TABLET BY MOUTH EVERY DAY 90 tablet 3   Specialty Vitamins Products (ONE-A-DAY BONE STRENGTH PO) Take 1 tablet by mouth daily.      No current facility-administered medications on file prior to visit.    Allergies  Allergen Reactions   Aspirin     REACTION: Swelling   Lisinopril Cough   Statins     Muscle cramps; pravastatin ok    Past Medical History:  Diagnosis Date   Allergic rhinitis    Allergy    Arthritis    Cataract    DM (diabetes mellitus) (HCC)    GERD (gastroesophageal reflux disease)    Glaucoma    ? of, but seeing optho and told recently not glaucoma   High cholesterol    HTN (hypertension)    Internal hemorrhoids    Osteoporosis    in 2010,  has refused bisphosphnate treatment or treament other then vit d and calcium   Panic anxiety syndrome 01/03/2013   Pure hyperglyceridemia     Past Surgical History:  Procedure Laterality Date   CESAREAN SECTION     COLONOSCOPY  05/03/2006   internal hemorrhoids   COLONOSCOPY     EYE SURGERY     OOPHORECTOMY Right 1999    Family History  Problem Relation Age of Onset   Colon cancer Mother 59       deceased age 68   Hypertension Mother    Diabetes Father    Hypertension Father    Lung cancer Sister    Cancer Sister        lung    Pancreatic cancer Sister    Stomach cancer Brother    Lung cancer Brother    Breast cancer Maternal Aunt    Coronary artery disease Maternal Uncle    Diabetes Paternal Aunt    Esophageal cancer Neg Hx    Liver cancer Neg Hx    Rectal cancer Neg Hx    Colon polyps Neg Hx     Social History   Socioeconomic History   Marital status: Married    Spouse name: Not  on file   Number of children: 4   Years of education: Not on file   Highest education level: Professional school degree (e.g., MD, DDS, DVM, JD)  Occupational History   Occupation: Teacher-- elementary    Comment: Retired  Tobacco Use   Smoking status: Never    Passive exposure: Past   Smokeless tobacco: Never  Vaping Use   Vaping status: Never Used  Substance and Sexual Activity   Alcohol use: Yes    Comment: occ - very rare   Drug use: No   Sexual activity: Not on file  Other Topics Concern   Not on file  Social History Narrative   Work or School: 2nd grade at [employer] Situation: lives with husband and son - has 4 children, son in Dealer school, son animation, daughter is Radio producer      Spiritual Beliefs: Baha'i faith - all religions are one, prayer based, no health care restrictions      No living will   Husband, then children, as health care POA   Would accept resuscitation   Would accept tube feeding at least short term--no sure about longer       Social Drivers of Health   Financial Resource Strain: Low Risk  (04/08/2022)   Overall Financial Resource Strain (CARDIA)    Difficulty of Paying Living Expenses: Not hard at all  Food Insecurity: No Food Insecurity (04/08/2022)   Hunger Vital Sign    Worried About Running Out of Food in the Last Year: Never true    Ran Out of Food in the Last Year: Never true  Transportation Needs: No Transportation Needs (04/08/2022)   PRAPARE - Administrator, Civil Service (Medical): No    Lack of Transportation (Non-Medical): No  Physical Activity: Sufficiently Active (04/08/2022)   Exercise Vital Sign    Days of Exercise per Week: 3 days    Minutes of Exercise per Session: 60 min  Stress: No Stress Concern Present (04/08/2022)   [employer] of Occupational Health - Occupational Stress Questionnaire    Feeling of Stress : Only a little  Social Connections: Moderately Integrated (04/08/2022)   Social Connection and Isolation Panel [NHANES]    Frequency of Communication with Friends and Family: More than three times a week    Frequency of Social Gatherings with Friends and Family: More than three times a week    Attends Religious Services: More than 4 times per year    Active Member of [employer] or Organizations: No    Attends Engineer, structural: Not on file    Marital Status: Married  Catering manager Violence: Not on file   Review of Systems No fever No other joint issues other than chronic hand pain     Objective:   Physical Exam Musculoskeletal:     Comments: No knee swelling Some pain with MCL testing as well as medial meniscus (but mild) Normal flexion without crepitus   Neurological:     Comments: Fairly normal gait No leg weakness            Assessment & Plan:

## 2023-06-17 DIAGNOSIS — H25813 Combined forms of age-related cataract, bilateral: Secondary | ICD-10-CM | POA: Diagnosis not present

## 2023-06-17 DIAGNOSIS — H04123 Dry eye syndrome of bilateral lacrimal glands: Secondary | ICD-10-CM | POA: Diagnosis not present

## 2023-06-17 DIAGNOSIS — E119 Type 2 diabetes mellitus without complications: Secondary | ICD-10-CM | POA: Diagnosis not present

## 2023-11-08 DIAGNOSIS — M65331 Trigger finger, right middle finger: Secondary | ICD-10-CM | POA: Diagnosis not present

## 2023-11-25 ENCOUNTER — Telehealth: Payer: Self-pay | Admitting: Internal Medicine

## 2023-11-25 NOTE — Telephone Encounter (Signed)
 Copied from CRM [id number]. Topic: Appointments - Transfer of Care >> Nov 25, 2023  2:35 PM Alexandria E wrote: Pt is requesting to transfer FROM: Dr. Charlie Denise Pt is requesting to transfer TO: Dr. Jenkins Carrel Reason for requested transfer: Patient's previous provider retired. It is the responsibility of the team the patient would like to transfer to (Dr. Jenkins Carrel) to reach out to the patient if for any reason this transfer is not acceptable.

## 2023-11-25 NOTE — Telephone Encounter (Signed)
 Requesting TOC from [place]. Please advise

## 2023-11-26 NOTE — Telephone Encounter (Signed)
 Note- Appointment is 03/21/2024 2:15 PM

## 2023-11-29 ENCOUNTER — Telehealth: Payer: Self-pay

## 2023-11-29 NOTE — Telephone Encounter (Signed)
 Worked from other note  Copied from [employer] [id number]. Topic: Clinical - Medication Question >> Nov 26, 2023 11:22 AM Revonda D wrote: Reason for CRM: Pt stated that she needs a refill for the metFORMIN  (GLUCOPHAGE ) 500 MG tablet because she will be out next month. Pt has a TOC visit schedule with Dr.Kulik on 03/22/14.

## 2023-12-06 ENCOUNTER — Ambulatory Visit: Payer: Self-pay

## 2023-12-06 ENCOUNTER — Telehealth: Payer: Self-pay | Admitting: Internal Medicine

## 2023-12-06 ENCOUNTER — Other Ambulatory Visit: Payer: Self-pay | Admitting: Family Medicine

## 2023-12-06 DIAGNOSIS — Z1231 Encounter for screening mammogram for malignant neoplasm of breast: Secondary | ICD-10-CM

## 2023-12-06 NOTE — Telephone Encounter (Signed)
 This RN made first attempt to contact pt with no answer. A voicemail was left with call back number provided.     Copied from CRM [id number]. Topic: Clinical - Red Word Triage >> Dec 06, 2023 10:09 AM [name] wrote: Red Word that prompted transfer to Nurse Triage: Patient is having severe pain cramps in legs, toes, hands, ankle, and hips.

## 2023-12-06 NOTE — Telephone Encounter (Signed)
 2nd call back, no answer. LVM to call clinic back.

## 2023-12-06 NOTE — Telephone Encounter (Unsigned)
 Copied from CRM #[id number]. Topic: Clinical - Medication Refill >> Dec 06, 2023 10:06 AM [name] wrote: Medication: metFORMIN  (GLUCOPHAGE ) 500 MG tablet  Has the patient contacted their pharmacy? No (Agent: If no, request that the patient contact the pharmacy for the refill. If patient does not wish to contact the pharmacy document the reason why and proceed with request.) (Agent: If yes, when and what did the pharmacy advise?)  This is the patient's preferred pharmacy:  [id number] - [place], [place] - [address] [place] [address] Phone: [phone] Fax: [phone]   Is this the correct pharmacy for this prescription? Yes If no, delete pharmacy and type the correct one.   Has the prescription been filled recently? Yes  Is the patient out of the medication? Yes  Has the patient been seen for an appointment in the last year OR does the patient have an upcoming appointment? Yes  Can we respond through MyChart? Yes  Agent: Please be advised that Rx refills may take up to 3 business days. We ask that you follow-up with your pharmacy.

## 2023-12-07 MED ORDER — METFORMIN HCL 500 MG PO TABS: 500.0000 mg | ORAL_TABLET | Freq: Two times a day (BID) | ORAL | 1 refills | Status: AC

## 2023-12-07 NOTE — Telephone Encounter (Signed)
 I have tried 2 times to call no pt with no success

## 2023-12-07 NOTE — Telephone Encounter (Signed)
 Rx sent electronically.

## 2024-01-04 ENCOUNTER — Ambulatory Visit
Admission: RE | Admit: 2024-01-04 | Discharge: 2024-01-04 | Disposition: A | Discharge status: Home / Self Care | Source: Ambulatory Visit | Attending: Family Medicine | Admitting: Family Medicine

## 2024-01-04 DIAGNOSIS — Z1231 Encounter for screening mammogram for malignant neoplasm of breast: Secondary | ICD-10-CM

## 2024-01-07 ENCOUNTER — Ambulatory Visit: Payer: Self-pay | Admitting: Family Medicine

## 2024-01-17 ENCOUNTER — Encounter: Payer: Self-pay | Admitting: Family Medicine

## 2024-01-18 NOTE — Telephone Encounter (Signed)
No earlier appts available at this time.

## 2024-01-20 NOTE — Telephone Encounter (Signed)
 LVM to move TOC appt up to 01/26/24. Please transfer to Front office to schedule.

## 2024-03-08 ENCOUNTER — Encounter

## 2024-03-21 ENCOUNTER — Encounter: Admitting: Family Medicine
